# Patient Record
Sex: Male | Born: 1935 | Race: White | Hispanic: No | Smoking: Never smoker
Health system: Southern US, Community
[De-identification: ages and names within clinical notes are randomized; demographics above are authoritative.]

## PROBLEM LIST (undated history)

## (undated) DIAGNOSIS — R609 Edema, unspecified: Secondary | ICD-10-CM

## (undated) DIAGNOSIS — K219 Gastro-esophageal reflux disease without esophagitis: Secondary | ICD-10-CM

## (undated) DIAGNOSIS — D649 Anemia, unspecified: Secondary | ICD-10-CM

## (undated) DIAGNOSIS — F419 Anxiety disorder, unspecified: Secondary | ICD-10-CM

## (undated) DIAGNOSIS — I10 Essential (primary) hypertension: Secondary | ICD-10-CM

## (undated) HISTORY — PX: HERNIA REPAIR: SHX51

## (undated) HISTORY — PX: BLADDER SURGERY: SHX569

---

## 2004-01-22 ENCOUNTER — Other Ambulatory Visit: Payer: Self-pay

## 2007-02-16 ENCOUNTER — Ambulatory Visit: Payer: Self-pay | Admitting: Unknown Physician Specialty

## 2009-10-22 ENCOUNTER — Ambulatory Visit: Payer: Self-pay | Admitting: General Practice

## 2009-11-11 ENCOUNTER — Ambulatory Visit: Payer: Self-pay | Admitting: General Practice

## 2009-11-18 ENCOUNTER — Ambulatory Visit: Payer: Self-pay | Admitting: General Practice

## 2010-04-24 ENCOUNTER — Ambulatory Visit: Payer: Self-pay | Admitting: General Practice

## 2010-05-03 ENCOUNTER — Ambulatory Visit: Payer: Self-pay | Admitting: General Practice

## 2011-01-13 ENCOUNTER — Ambulatory Visit: Payer: Self-pay | Admitting: Unknown Physician Specialty

## 2012-05-23 ENCOUNTER — Ambulatory Visit: Payer: Self-pay | Admitting: Ophthalmology

## 2012-05-23 LAB — POTASSIUM: Potassium: 3.8 mmol/L (ref 3.5–5.1)

## 2012-06-05 ENCOUNTER — Ambulatory Visit: Payer: Self-pay | Admitting: Ophthalmology

## 2012-07-18 ENCOUNTER — Ambulatory Visit: Payer: Self-pay | Admitting: Ophthalmology

## 2012-07-18 LAB — POTASSIUM: Potassium: 3.5 mmol/L (ref 3.5–5.1)

## 2012-07-31 ENCOUNTER — Ambulatory Visit: Payer: Self-pay | Admitting: Ophthalmology

## 2014-07-29 DIAGNOSIS — R339 Retention of urine, unspecified: Secondary | ICD-10-CM | POA: Insufficient documentation

## 2015-02-10 NOTE — Op Note (Signed)
PATIENT NAME:  Danny Brady, Danny Brady MR#:  [id number] DATE OF BIRTH:  [birth year]  DATE OF PROCEDURE:  07/31/2012  PREOPERATIVE DIAGNOSIS: Visually significant cataract of the left eye.   POSTOPERATIVE DIAGNOSIS: Visually significant cataract of the left eye.   OPERATIVE PROCEDURE: Cataract extraction by phacoemulsification with implant of intraocular lens to left eye.   SURGEON: Galen ManilaWilliam Virlee Stroschein, MD.   ANESTHESIA:  1. Managed anesthesia care.  2. Topical tetracaine drops followed by 2% Xylocaine jelly applied in the preoperative holding area.   COMPLICATIONS: None.   TECHNIQUE:  Stop and chop.   DESCRIPTION OF PROCEDURE: The patient was examined and consented in the preoperative holding area where the aforementioned topical anesthesia was applied to the left eye and then brought back to the Operating Room where the left eye was prepped and draped in the usual sterile ophthalmic fashion and a lid speculum was placed. A paracentesis was created with the side port blade and the anterior chamber was filled with viscoelastic. A near clear corneal incision was performed with the steel keratome. A continuous curvilinear capsulorrhexis was performed with a cystotome followed by the capsulorrhexis forceps. Hydrodissection and hydrodelineation were carried out with BSS on a blunt cannula. The lens was removed in a stop and chop technique and the remaining cortical material was removed with the irrigation-aspiration handpiece. The capsular bag was inflated with viscoelastic and the Tecnis ZCB00 20.0-diopter lens, serial number [id number] was placed in the capsular bag without complication. The remaining viscoelastic was removed from the eye with the irrigation-aspiration handpiece. The wounds were hydrated. The anterior chamber was flushed with Miostat and the eye was inflated to physiologic pressure. The wounds were found to be water tight. The eye was dressed with Vigamox. The patient was given protective  glasses to wear throughout the day and a shield with which to sleep tonight. The patient was also given drops with which to begin a drop regimen today and will follow-up with me in one day.  ____________________________ Jerilee FieldWilliam L. Wilena Tyndall, MD wlp:slb D: 07/31/2012 17:05:00 ET T: 08/01/2012 07:28:13 ET JOB#: [id number]  cc: Abhay Godbolt L. Kanijah Groseclose, MD, <Dictator> Jerilee FieldWILLIAM L Adriannah Steinkamp MD ELECTRONICALLY SIGNED 08/13/2012 13:45

## 2015-02-10 NOTE — Op Note (Signed)
PATIENT NAME:  Danny Brady, Danny Brady MR#:  [id number] DATE OF BIRTH:  1927-05-07  DATE OF PROCEDURE:  06/05/2012  PREOPERATIVE DIAGNOSIS: Visually significant cataract of the right eye.   POSTOPERATIVE DIAGNOSIS: Visually significant cataract of the right eye.   OPERATIVE PROCEDURE: Cataract extraction by phacoemulsification with implant of intraocular lens to right eye.   SURGEON: Galen ManilaWilliam Deklen Popelka, MD   ANESTHESIA:  1. Managed anesthesia care.  2. Topical tetracaine drops followed by 2% Xylocaine jelly applied in the preoperative holding area.   COMPLICATIONS: None.   TECHNIQUE:  Stop-and-chop    DESCRIPTION OF PROCEDURE: The patient was examined and consented in the preoperative holding area where the aforementioned topical anesthesia was applied to the right eye and then brought back to the Operating Room where the right eye was prepped and draped in the usual sterile ophthalmic fashion and a lid speculum was placed. A paracentesis was created with the side port blade and the anterior chamber was filled with viscoelastic. A near clear corneal incision was performed with the steel keratome. A continuous curvilinear capsulorrhexis was performed with a cystotome followed by the capsulorrhexis forceps. Hydrodissection and hydrodelineation were carried out with BSS on a blunt cannula. The lens was removed in a stop-and-chop technique and the remaining cortical material was removed with the irrigation-aspiration handpiece. The capsular bag was inflated with viscoelastic and the Technus ZCB00 20.5-diopter lens, serial number [id number] was placed in the capsular bag without complication. The remaining viscoelastic was removed from the eye with the irrigation-aspiration handpiece. The wounds were hydrated. The anterior chamber was flushed with Miostat and the eye was inflated to physiologic pressure. The wounds were found to be water tight. The eye was dressed with Vigamox. The patient was given protective  glasses to wear throughout the day and a shield with which to sleep tonight. The patient was also given drops with which to begin a drop regimen today and will follow-up with me in one day.   ____________________________ Jerilee FieldWilliam L. Dannika Hilgeman, MD wlp:drc D: 06/05/2012 16:59:31 ET T: 06/05/2012 17:41:47 ET JOB#: [id number]  cc: Jeroline Wolbert L. Carmilla Granville, MD, <Dictator> Jerilee FieldWILLIAM L Padraig Nhan MD ELECTRONICALLY SIGNED 06/06/2012 15:32

## 2015-08-18 DIAGNOSIS — Z85828 Personal history of other malignant neoplasm of skin: Secondary | ICD-10-CM | POA: Insufficient documentation

## 2016-02-23 DIAGNOSIS — R339 Retention of urine, unspecified: Secondary | ICD-10-CM | POA: Insufficient documentation

## 2016-03-23 DIAGNOSIS — N138 Other obstructive and reflux uropathy: Secondary | ICD-10-CM | POA: Insufficient documentation

## 2016-03-23 DIAGNOSIS — E782 Mixed hyperlipidemia: Secondary | ICD-10-CM | POA: Insufficient documentation

## 2016-05-02 DIAGNOSIS — I38 Endocarditis, valve unspecified: Secondary | ICD-10-CM | POA: Insufficient documentation

## 2016-09-23 DIAGNOSIS — N393 Stress incontinence (female) (male): Secondary | ICD-10-CM | POA: Insufficient documentation

## 2016-12-21 DIAGNOSIS — E538 Deficiency of other specified B group vitamins: Secondary | ICD-10-CM | POA: Insufficient documentation

## 2018-05-13 ENCOUNTER — Emergency Department: Payer: Medicare Other

## 2018-05-13 ENCOUNTER — Emergency Department
Admission: EM | Admit: 2018-05-13 | Discharge: 2018-05-13 | Disposition: A | Discharge status: Home / Self Care | Payer: Medicare Other | Attending: Emergency Medicine | Admitting: Emergency Medicine

## 2018-05-13 ENCOUNTER — Other Ambulatory Visit: Payer: Self-pay

## 2018-05-13 DIAGNOSIS — W19XXXA Unspecified fall, initial encounter: Secondary | ICD-10-CM

## 2018-05-13 DIAGNOSIS — Y998 Other external cause status: Secondary | ICD-10-CM | POA: Insufficient documentation

## 2018-05-13 DIAGNOSIS — M25552 Pain in left hip: Secondary | ICD-10-CM | POA: Insufficient documentation

## 2018-05-13 DIAGNOSIS — Y92010 Kitchen of single-family (private) house as the place of occurrence of the external cause: Secondary | ICD-10-CM | POA: Diagnosis not present

## 2018-05-13 DIAGNOSIS — Z23 Encounter for immunization: Secondary | ICD-10-CM | POA: Insufficient documentation

## 2018-05-13 DIAGNOSIS — I10 Essential (primary) hypertension: Secondary | ICD-10-CM | POA: Diagnosis not present

## 2018-05-13 DIAGNOSIS — W01198A Fall on same level from slipping, tripping and stumbling with subsequent striking against other object, initial encounter: Secondary | ICD-10-CM | POA: Insufficient documentation

## 2018-05-13 DIAGNOSIS — S299XXA Unspecified injury of thorax, initial encounter: Secondary | ICD-10-CM | POA: Diagnosis present

## 2018-05-13 DIAGNOSIS — Y9301 Activity, walking, marching and hiking: Secondary | ICD-10-CM | POA: Insufficient documentation

## 2018-05-13 DIAGNOSIS — S20219A Contusion of unspecified front wall of thorax, initial encounter: Secondary | ICD-10-CM

## 2018-05-13 HISTORY — DX: Essential (primary) hypertension: I10

## 2018-05-13 MED ORDER — BACITRACIN ZINC 500 UNIT/GM EX OINT
TOPICAL_OINTMENT | Freq: Once | CUTANEOUS | Status: AC
  Administered 2018-05-13: 1 via TOPICAL
  Filled 2018-05-13: qty 0.9

## 2018-05-13 MED ORDER — TETANUS-DIPHTH-ACELL PERTUSSIS 5-2.5-18.5 LF-MCG/0.5 IM SUSP
0.5000 mL | Freq: Once | INTRAMUSCULAR | Status: AC
  Administered 2018-05-13: 0.5 mL via INTRAMUSCULAR
  Filled 2018-05-13: qty 0.5

## 2018-05-13 NOTE — ED Provider Notes (Signed)
[facility] Emergency Department Provider Note  ____________________________________________   I have reviewed the triage vital signs and the nursing notes. Where available I have reviewed prior notes and, if possible and indicated, outside hospital notes.    HISTORY  Chief Complaint Fall    HPI Danny Brady is a 84 y.o. male not on any blood thinners, he was getting something out of the kitchen he turned and stumbled, he is arthritic feet and sometimes he states he trips.  He had a mechanical trip.  He did not pass out he did not hit his head he bumped his back against a cabinet and then landed on his bottom.  He has a skin tear to his back he states and family collaborates, and he has a minimal pain in the left hip but is able to ambulate with no difficulty.  Did not pass out did not hit his head no prodrome no chest pain no shortness of breath no nausea no vomiting no abdominal pain no dysuria no urinary frequency no new medications feels fine at this time unsure about tetanus shot   Past Medical History:  Diagnosis Date  . Hypertension     There are no active problems to display for this patient.   History reviewed. No pertinent surgical history.  Prior to Admission medications   Not on File    Allergies Patient has no known allergies.  History reviewed. No pertinent family history.  Social History Social History   Tobacco Use  . Smoking status: Never Smoker  . Smokeless tobacco: Never Used  Substance Use Topics  . Alcohol use: Not Currently    Frequency: Never  . Drug use: Never    Review of Systems Constitutional: No fever/chills Eyes: No visual changes. ENT: No sore throat. No stiff neck no neck pain Cardiovascular: Denies chest pain. Respiratory: Denies shortness of breath. Gastrointestinal:   no vomiting.  No diarrhea.  No constipation. Genitourinary: Negative for dysuria. Musculoskeletal: Negative lower extremity  swelling Skin: Negative for rash. Neurological: Negative for severe headaches, focal weakness or numbness.   ____________________________________________   PHYSICAL EXAM:  VITAL SIGNS: ED Triage Vitals  Enc Vitals Group     BP 05/13/18 1316 (!) 141/82     Pulse Rate 05/13/18 1316 79     Resp 05/13/18 1316 14     Temp 05/13/18 1316 98.6 F (37 C)     Temp Source 05/13/18 1316 Oral     SpO2 05/13/18 1316 99 %     Weight 05/13/18 1317 194 lb (88 kg)     Height 05/13/18 1317 5\' 9"  (1.753 m)     Head Circumference --      Peak Flow --      Pain Score 05/13/18 1317 10     Pain Loc --      Pain Edu? --      Excl. in GC? --     Constitutional: Alert and oriented. Well appearing and in no acute distress. Eyes: Conjunctivae are normal Head: Atraumatic HEENT: No congestion/rhinnorhea. Mucous membranes are moist.  Oropharynx non-erythematous Neck:   Nontender with no meningismus, no masses, no stridor Cardiovascular: Normal rate, regular rhythm. Grossly normal heart sounds.  Good peripheral circulation. Back: Respiratory: Normal respiratory effort.  No retractions. Lungs CTAB. Abdominal: Soft and nontender. No distention. No guarding no rebound Back: In the mid thoracic region just to the left of the spine there is an abrasion, no active bleeding not deep nothing to suture  mild hematoma underneath no crepitus no fracture palpated.  there is no midline tenderness there are no lesions noted. there is no CVA tenderness Musculoskeletal: No lower extremity tenderness, he states he has some tenderness to his left hip I can fully range it and he was able to bear weight upon it.  No other bony tenderness noted no upper extremity tenderness. No joint effusions, no DVT signs strong distal pulses no edema Neurologic:  Normal speech and language. No gross focal neurologic deficits are appreciated.  Skin:  Skin is warm, dry and intact. No rash noted. Psychiatric: Mood and affect are normal. Speech  and behavior are normal.  ____________________________________________   LABS (all labs ordered are listed, but only abnormal results are displayed)  Labs Reviewed - No data to display  Pertinent labs  results that were available during my care of the patient were reviewed by me and considered in my medical decision making (see chart for details). ____________________________________________  EKG  I personally interpreted any EKGs ordered by me or triage  ____________________________________________  RADIOLOGY  Pertinent labs & imaging results that were available during my care of the patient were reviewed by me and considered in my medical decision making (see chart for details). If possible, patient and/or family made aware of any abnormal findings.  No results found. ____________________________________________    PROCEDURES  Procedure(s) performed: None  Procedures  Critical Care performed: None  ____________________________________________   INITIAL IMPRESSION / ASSESSMENT AND PLAN / ED COURSE  Pertinent labs & imaging results that were available during my care of the patient were reviewed by me and considered in my medical decision making (see chart for details).  Non-syncopal fall, no complaints except for mild injury from the fall itself.  Low suspicion for hip fracture as he was amatory and I can range but will get an x-ray, he does not have significant pain does not want pain medication.  Has an abrasion to his back, we will clean it up and put bacitracin on it but there is no indication for repair.  Low suspicion for spinal injury, he has no midline tenderness he is neurologically intact, in addition, low suspicion for significant rib injury, no crepitus no evidence of flail chest, nonetheless we will get a chest x-ray, fall is reassuring is my hope that we can safely home we will update his tetanus    ____________________________________________   FINAL  CLINICAL IMPRESSION(S) / ED DIAGNOSES  Final diagnoses:  None      This chart was dictated using voice recognition software.  Despite best efforts to proofread,  errors can occur which can change meaning.      Jeanmarie Plant, MD 05/13/18 1350

## 2018-05-13 NOTE — Discharge Instructions (Addendum)
Take Tylenol or Motrin as tolerated directed for the pain, return to the emergency room for new or worrisome symptoms any shortness of breath, chest pain, hip pain, lightheadedness, frequent falls or other concerns please be careful walking around house, if you have any complaints or concerns please come back.

## 2018-05-13 NOTE — ED Triage Notes (Signed)
Pt presents via EMS c/o mechanical fall at home while cooking. Denies syncope. Denies LOC. Reports left hip pain and back pain. Skin tear noted to back. Ambulatory to stretcher per EMS. A&Ox4.

## 2018-07-25 ENCOUNTER — Other Ambulatory Visit: Payer: Self-pay

## 2018-07-25 ENCOUNTER — Emergency Department
Admission: EM | Admit: 2018-07-25 | Discharge: 2018-07-25 | Disposition: A | Discharge status: Home / Self Care | Payer: Medicare Other | Attending: Emergency Medicine | Admitting: Emergency Medicine

## 2018-07-25 ENCOUNTER — Encounter: Payer: Self-pay | Admitting: Emergency Medicine

## 2018-07-25 ENCOUNTER — Emergency Department: Payer: Medicare Other

## 2018-07-25 DIAGNOSIS — Y9389 Activity, other specified: Secondary | ICD-10-CM | POA: Diagnosis not present

## 2018-07-25 DIAGNOSIS — I10 Essential (primary) hypertension: Secondary | ICD-10-CM | POA: Insufficient documentation

## 2018-07-25 DIAGNOSIS — W01198A Fall on same level from slipping, tripping and stumbling with subsequent striking against other object, initial encounter: Secondary | ICD-10-CM | POA: Insufficient documentation

## 2018-07-25 DIAGNOSIS — Y999 Unspecified external cause status: Secondary | ICD-10-CM | POA: Insufficient documentation

## 2018-07-25 DIAGNOSIS — S0003XA Contusion of scalp, initial encounter: Secondary | ICD-10-CM | POA: Diagnosis not present

## 2018-07-25 DIAGNOSIS — S0990XA Unspecified injury of head, initial encounter: Secondary | ICD-10-CM | POA: Diagnosis present

## 2018-07-25 DIAGNOSIS — S0083XA Contusion of other part of head, initial encounter: Secondary | ICD-10-CM

## 2018-07-25 DIAGNOSIS — W19XXXA Unspecified fall, initial encounter: Secondary | ICD-10-CM

## 2018-07-25 DIAGNOSIS — Y92009 Unspecified place in unspecified non-institutional (private) residence as the place of occurrence of the external cause: Secondary | ICD-10-CM | POA: Diagnosis not present

## 2018-07-25 MED ORDER — BACITRACIN ZINC 500 UNIT/GM EX OINT: TOPICAL_OINTMENT | Freq: Once | CUTANEOUS | Status: DC

## 2018-07-25 NOTE — ED Triage Notes (Addendum)
Patient ambulatory to triage with steady gait, without difficulty or distress noted; pt reports PTA bent over to put socks on and fell forward hitting forehead on brick; denies LOC/HA/dizziness; swelling/abrasion noted to right side forehead; also st skin tear to right arm; pt denies right arm pain and st "its only a little sore", indicating head; CT has been performed while waiting; ice pack applied to forehead; no cervical tenderness with palpation

## 2018-07-25 NOTE — Discharge Instructions (Signed)
Your exam and CT is essentially normal following your fall. You have a large hematoma on the forehead. You can expect this to remain for a few days. You may develop a black eye as the swelling resolves. Take Tylenol or ibuprofen as needed for pain. See Dr. Hyacinth Meeker as needed. Return to the Emergency Department for any concerning symptoms.

## 2018-07-26 NOTE — ED Provider Notes (Signed)
[facility] Emergency Department Provider Note ____________________________________________  Time seen: 2017  I have reviewed the triage vital signs and the nursing notes.  HISTORY  Chief Complaint  Fall  HPI Danny Brady is a 84 y.o. male presents to the ED accompanied by his adult children, for evaluation of injury sustained following a fall at home. He describes bending over to pick up and put on his socks, when he lost his balance and tipped over. He his his forehead on the bricks. There is no reported LOC, headache, dizziness, or weakness. He presents now with a large hematoma over the right brow/forehead. There is no reported arm pain, chest pain, or neck pain.   Past Medical History:  Diagnosis Date  . Hypertension    There are no active problems to display for this patient.  History reviewed. No pertinent surgical history.  Prior to Admission medications   Not on File   Allergies Patient has no known allergies.  No family history on file.  Social History Social History   Tobacco Use  . Smoking status: Never Smoker  . Smokeless tobacco: Never Used  Substance Use Topics  . Alcohol use: Not Currently    Frequency: Never  . Drug use: Never    Review of Systems  Constitutional: Negative for fever. Eyes: Negative for visual changes. ENT: Negative for sore throat. Cardiovascular: Negative for chest pain. Respiratory: Negative for shortness of breath. Gastrointestinal: Negative for abdominal pain, vomiting and diarrhea. Genitourinary: Negative for dysuria. Musculoskeletal: Negative for back pain. Skin: Negative for rash. Forehead hematoma & Abrasions over the forehead Neurological: Negative for headaches, focal weakness or numbness. ____________________________________________  PHYSICAL EXAM:  VITAL SIGNS: ED Triage Vitals [07/25/18 1954]  Enc Vitals Group     BP 134/67     Pulse Rate 76     Resp 18     Temp 97.9 F (36.6 C)      Temp Source Oral     SpO2 97 %     Weight 194 lb (88 kg)     Height 5\' 8"  (1.727 m)     Head Circumference      Peak Flow      Pain Score 3     Pain Loc      Pain Edu?      Excl. in GC?     Constitutional: Alert and oriented. Well appearing and in no distress. Head: Normocephalic and atraumatic, except for a large hematoma to the right forehead. There are overlying abrasions to the same area.  Eyes: Conjunctivae are normal. Normal extraocular movements Ears: Canals clear. TMs intact bilaterally. Nose: No congestion/rhinorrhea/epistaxis. Neck: Supple. Normal ROM without midline tenderness.  Cardiovascular: Normal rate, regular rhythm. Normal distal pulses. Respiratory: Normal respiratory effort. No wheezes/rales/rhonchi. Gastrointestinal: Soft and nontender. No distention. Musculoskeletal: Nontender with normal range of motion in all extremities.  Neurologic:  Normal gait without ataxia. Normal speech and language. No gross focal neurologic deficits are appreciated. Skin:  Skin is warm, dry and intact. No rash noted. Psychiatric: Mood and affect are normal. Patient exhibits appropriate insight and judgment. ____________________________________________   RADIOLOGY  CT Head w/o CM  IMPRESSION: Large right anterior frontal scalp hematoma.  No acute intracranial hemorrhage or other acute intracranial abnormality.  Chronic atrophy and white matter microvascular changes. ____________________________________________  PROCEDURES  Procedures ____________________________________________  INITIAL IMPRESSION / ASSESSMENT AND PLAN / ED COURSE  Patient with ED evaluation of injury sustained following a mechanical fall.  Patient fell from  ground-level, and hit his forehead.  He had a large hematoma to the forehead.  CT is reassuring to him family as it shows no acute intracranial process.  Patient otherwise has no significant complaints at this time.  Will be discharged with  instructions to treat the abrasions with antibiotic ointment and apply ice to the hematoma.  He should follow-up with his primary provider or return to the ED immediately for any worsening symptoms as discussed.  He is also informed he will likely develop some swelling and edema to the lid and ecchymosis to the right eye.  Patient will take Tylenol as needed for pain. ____________________________________________  FINAL CLINICAL IMPRESSION(S) / ED DIAGNOSES  Final diagnoses:  Fall in home, initial encounter  Minor head injury, initial encounter  Contusion of face, initial encounter      Lissa Hoard, PA-C 07/26/18 0058    Myrna Blazer, MD 07/28/18 2312

## 2019-11-20 ENCOUNTER — Encounter
Admission: EM | Disposition: A | Discharge status: Skilled Nursing Facility | Payer: Self-pay | Source: Home / Self Care | Attending: Family Medicine

## 2019-11-20 ENCOUNTER — Emergency Department: Payer: Medicare PPO

## 2019-11-20 ENCOUNTER — Encounter: Payer: Self-pay | Admitting: Emergency Medicine

## 2019-11-20 ENCOUNTER — Inpatient Hospital Stay: Payer: Medicare PPO | Admitting: Anesthesiology

## 2019-11-20 ENCOUNTER — Inpatient Hospital Stay: Payer: Medicare PPO

## 2019-11-20 ENCOUNTER — Inpatient Hospital Stay
Admission: EM | Admit: 2019-11-20 | Discharge: 2019-11-25 | DRG: 481 | Disposition: A | Discharge status: Skilled Nursing Facility | Payer: Medicare PPO | Attending: Hospitalist | Admitting: Hospitalist

## 2019-11-20 ENCOUNTER — Other Ambulatory Visit: Payer: Self-pay

## 2019-11-20 DIAGNOSIS — I480 Paroxysmal atrial fibrillation: Secondary | ICD-10-CM | POA: Diagnosis not present

## 2019-11-20 DIAGNOSIS — I110 Hypertensive heart disease with heart failure: Secondary | ICD-10-CM | POA: Diagnosis present

## 2019-11-20 DIAGNOSIS — N400 Enlarged prostate without lower urinary tract symptoms: Secondary | ICD-10-CM | POA: Diagnosis present

## 2019-11-20 DIAGNOSIS — I10 Essential (primary) hypertension: Secondary | ICD-10-CM | POA: Diagnosis present

## 2019-11-20 DIAGNOSIS — Z888 Allergy status to other drugs, medicaments and biological substances status: Secondary | ICD-10-CM

## 2019-11-20 DIAGNOSIS — E876 Hypokalemia: Secondary | ICD-10-CM | POA: Diagnosis not present

## 2019-11-20 DIAGNOSIS — Z85828 Personal history of other malignant neoplasm of skin: Secondary | ICD-10-CM

## 2019-11-20 DIAGNOSIS — W19XXXA Unspecified fall, initial encounter: Secondary | ICD-10-CM | POA: Diagnosis not present

## 2019-11-20 DIAGNOSIS — D62 Acute posthemorrhagic anemia: Secondary | ICD-10-CM | POA: Diagnosis not present

## 2019-11-20 DIAGNOSIS — M62838 Other muscle spasm: Secondary | ICD-10-CM | POA: Diagnosis not present

## 2019-11-20 DIAGNOSIS — S72141A Displaced intertrochanteric fracture of right femur, initial encounter for closed fracture: Principal | ICD-10-CM | POA: Diagnosis present

## 2019-11-20 DIAGNOSIS — Z79899 Other long term (current) drug therapy: Secondary | ICD-10-CM

## 2019-11-20 DIAGNOSIS — Z419 Encounter for procedure for purposes other than remedying health state, unspecified: Secondary | ICD-10-CM

## 2019-11-20 DIAGNOSIS — W010XXA Fall on same level from slipping, tripping and stumbling without subsequent striking against object, initial encounter: Secondary | ICD-10-CM | POA: Diagnosis present

## 2019-11-20 DIAGNOSIS — I5022 Chronic systolic (congestive) heart failure: Secondary | ICD-10-CM | POA: Diagnosis present

## 2019-11-20 DIAGNOSIS — Z20822 Contact with and (suspected) exposure to covid-19: Secondary | ICD-10-CM | POA: Diagnosis present

## 2019-11-20 DIAGNOSIS — Z7982 Long term (current) use of aspirin: Secondary | ICD-10-CM | POA: Diagnosis not present

## 2019-11-20 DIAGNOSIS — S72091A Other fracture of head and neck of right femur, initial encounter for closed fracture: Secondary | ICD-10-CM | POA: Diagnosis not present

## 2019-11-20 DIAGNOSIS — K219 Gastro-esophageal reflux disease without esophagitis: Secondary | ICD-10-CM | POA: Diagnosis present

## 2019-11-20 DIAGNOSIS — I4819 Other persistent atrial fibrillation: Secondary | ICD-10-CM | POA: Diagnosis present

## 2019-11-20 DIAGNOSIS — S72001A Fracture of unspecified part of neck of right femur, initial encounter for closed fracture: Secondary | ICD-10-CM

## 2019-11-20 HISTORY — DX: Edema, unspecified: R60.9

## 2019-11-20 HISTORY — PX: INTRAMEDULLARY (IM) NAIL INTERTROCHANTERIC: SHX5875

## 2019-11-20 LAB — COMPREHENSIVE METABOLIC PANEL
ALT: 11 U/L (ref 0–44)
AST: 19 U/L (ref 15–41)
Albumin: 4 g/dL (ref 3.5–5.0)
Alkaline Phosphatase: 55 U/L (ref 38–126)
Anion gap: 10 (ref 5–15)
BUN: 27 mg/dL — ABNORMAL HIGH (ref 8–23)
CO2: 26 mmol/L (ref 22–32)
Calcium: 8.9 mg/dL (ref 8.9–10.3)
Chloride: 103 mmol/L (ref 98–111)
Creatinine, Ser: 1.07 mg/dL (ref 0.61–1.24)
GFR calc Af Amer: >60 mL/min (ref >60)
GFR calc non Af Amer: >60 mL/min (ref >60)
Glucose, Bld: 124 mg/dL — ABNORMAL HIGH (ref 70–99)
Potassium: 3.3 mmol/L — ABNORMAL LOW (ref 3.5–5.1)
Sodium: 139 mmol/L (ref 135–145)
Total Bilirubin: 0.9 mg/dL (ref 0.3–1.2)
Total Protein: 7.2 g/dL (ref 6.5–8.1)

## 2019-11-20 LAB — CBC WITH DIFFERENTIAL/PLATELET
Abs Immature Granulocytes: 0.03 10*3/uL (ref 0.00–0.07)
Basophils Absolute: 0 10*3/uL (ref 0.0–0.1)
Basophils Relative: 1 %
Eosinophils Absolute: 0 10*3/uL (ref 0.0–0.5)
Eosinophils Relative: 1 %
HCT: 36.5 % — ABNORMAL LOW (ref 39.0–52.0)
Hemoglobin: 11.7 g/dL — ABNORMAL LOW (ref 13.0–17.0)
Immature Granulocytes: 1 %
Lymphocytes Relative: 19 %
Lymphs Abs: 1 10*3/uL (ref 0.7–4.0)
MCH: 31.5 pg (ref 26.0–34.0)
MCHC: 32.1 g/dL (ref 30.0–36.0)
MCV: 98.4 fL (ref 80.0–100.0)
Monocytes Absolute: 0.4 10*3/uL (ref 0.1–1.0)
Monocytes Relative: 9 %
Neutro Abs: 3.6 10*3/uL (ref 1.7–7.7)
Neutrophils Relative %: 69 %
Platelets: 151 10*3/uL (ref 150–400)
RBC: 3.71 MIL/uL — ABNORMAL LOW (ref 4.22–5.81)
RDW: 14.1 % (ref 11.5–15.5)
WBC: 5.1 10*3/uL (ref 4.0–10.5)
nRBC: 0 % (ref 0.0–0.2)

## 2019-11-20 LAB — BRAIN NATRIURETIC PEPTIDE: B Natriuretic Peptide: 286 pg/mL — ABNORMAL HIGH (ref 0.0–100.0)

## 2019-11-20 LAB — PROTIME-INR
INR: 1 (ref 0.8–1.2)
Prothrombin Time: 13 seconds (ref 11.4–15.2)

## 2019-11-20 LAB — RESPIRATORY PANEL BY RT PCR (FLU A&B, COVID)
Influenza A by PCR: NEGATIVE
Influenza B by PCR: NEGATIVE
SARS Coronavirus 2 by RT PCR: NEGATIVE

## 2019-11-20 LAB — APTT: aPTT: 30 seconds (ref 24–36)

## 2019-11-20 SURGERY — FIXATION, FRACTURE, INTERTROCHANTERIC, WITH INTRAMEDULLARY ROD
Anesthesia: Spinal | Laterality: Right

## 2019-11-20 MED ORDER — METOCLOPRAMIDE HCL 5 MG/ML IJ SOLN: 5.0000 mg | Freq: Three times a day (TID) | INTRAMUSCULAR | Status: DC | PRN

## 2019-11-20 MED ORDER — PROPOFOL 500 MG/50ML IV EMUL
INTRAVENOUS | Status: DC | PRN
  Administered 2019-11-20: 50 ug/kg/min via INTRAVENOUS

## 2019-11-20 MED ORDER — METOCLOPRAMIDE HCL 10 MG PO TABS: 5.0000 mg | ORAL_TABLET | Freq: Three times a day (TID) | ORAL | Status: DC | PRN

## 2019-11-20 MED ORDER — EPHEDRINE SULFATE 50 MG/ML IJ SOLN
INTRAMUSCULAR | Status: AC
  Filled 2019-11-20: qty 1

## 2019-11-20 MED ORDER — ONDANSETRON HCL 4 MG/2ML IJ SOLN
4.0000 mg | Freq: Four times a day (QID) | INTRAMUSCULAR | Status: DC | PRN
  Filled 2019-11-20: qty 2

## 2019-11-20 MED ORDER — MORPHINE SULFATE (PF) 2 MG/ML IV SOLN
0.5000 mg | INTRAVENOUS | Status: DC | PRN
  Administered 2019-11-20: 0.5 mg via INTRAVENOUS
  Filled 2019-11-20: qty 1

## 2019-11-20 MED ORDER — CEFAZOLIN SODIUM-DEXTROSE 2-4 GM/100ML-% IV SOLN
2.0000 g | INTRAVENOUS | Status: AC
  Administered 2019-11-20: 2 g via INTRAVENOUS

## 2019-11-20 MED ORDER — ONDANSETRON HCL 4 MG/2ML IJ SOLN: 4.0000 mg | Freq: Once | INTRAMUSCULAR | Status: DC | PRN

## 2019-11-20 MED ORDER — FUROSEMIDE 40 MG PO TABS
40.0000 mg | ORAL_TABLET | Freq: Every day | ORAL | Status: DC
  Administered 2019-11-22 – 2019-11-25 (×4): 40 mg via ORAL
  Filled 2019-11-20 (×5): qty 1

## 2019-11-20 MED ORDER — METHOCARBAMOL 500 MG PO TABS
500.0000 mg | ORAL_TABLET | Freq: Three times a day (TID) | ORAL | Status: DC | PRN
  Administered 2019-11-20: 500 mg via ORAL
  Filled 2019-11-20 (×2): qty 1

## 2019-11-20 MED ORDER — BUPIVACAINE HCL (PF) 0.5 % IJ SOLN
INTRAMUSCULAR | Status: DC | PRN
  Administered 2019-11-20: 3 mL

## 2019-11-20 MED ORDER — PANTOPRAZOLE SODIUM 40 MG PO TBEC
40.0000 mg | DELAYED_RELEASE_TABLET | Freq: Every day | ORAL | Status: DC
  Administered 2019-11-20 – 2019-11-25 (×6): 40 mg via ORAL
  Filled 2019-11-20 (×6): qty 1

## 2019-11-20 MED ORDER — SPIRONOLACTONE 25 MG PO TABS
12.5000 mg | ORAL_TABLET | Freq: Every day | ORAL | Status: DC
  Administered 2019-11-22 – 2019-11-25 (×4): 12.5 mg via ORAL
  Filled 2019-11-20: qty 1
  Filled 2019-11-20: qty 0.5
  Filled 2019-11-20: qty 1
  Filled 2019-11-20 (×4): qty 0.5
  Filled 2019-11-20 (×3): qty 1

## 2019-11-20 MED ORDER — SODIUM CHLORIDE 0.9 % IV SOLN: Freq: Once | INTRAVENOUS | Status: DC

## 2019-11-20 MED ORDER — SODIUM CHLORIDE 0.9 % IV SOLN
INTRAVENOUS | Status: DC | PRN
  Administered 2019-11-20: 50 ug/min via INTRAVENOUS

## 2019-11-20 MED ORDER — ONDANSETRON HCL 4 MG PO TABS: 4.0000 mg | ORAL_TABLET | Freq: Four times a day (QID) | ORAL | Status: DC | PRN

## 2019-11-20 MED ORDER — FINASTERIDE 5 MG PO TABS
5.0000 mg | ORAL_TABLET | Freq: Every day | ORAL | Status: DC
  Administered 2019-11-21 – 2019-11-25 (×5): 5 mg via ORAL
  Filled 2019-11-20 (×5): qty 1

## 2019-11-20 MED ORDER — PHENYLEPHRINE HCL (PRESSORS) 10 MG/ML IV SOLN
INTRAVENOUS | Status: DC | PRN
  Administered 2019-11-20: 150 ug via INTRAVENOUS
  Administered 2019-11-20: 100 ug via INTRAVENOUS

## 2019-11-20 MED ORDER — SENNOSIDES-DOCUSATE SODIUM 8.6-50 MG PO TABS
1.0000 | ORAL_TABLET | Freq: Every evening | ORAL | Status: DC | PRN
  Administered 2019-11-24: 1 via ORAL
  Filled 2019-11-20: qty 1

## 2019-11-20 MED ORDER — PROPOFOL 10 MG/ML IV BOLUS
INTRAVENOUS | Status: AC
  Filled 2019-11-20: qty 20

## 2019-11-20 MED ORDER — ONDANSETRON HCL 4 MG/2ML IJ SOLN
4.0000 mg | Freq: Once | INTRAMUSCULAR | Status: AC
  Administered 2019-11-20: 4 mg via INTRAVENOUS
  Filled 2019-11-20: qty 2

## 2019-11-20 MED ORDER — FENTANYL CITRATE (PF) 100 MCG/2ML IJ SOLN
25.0000 ug | Freq: Once | INTRAMUSCULAR | Status: AC
  Administered 2019-11-20: 25 ug via INTRAVENOUS
  Filled 2019-11-20: qty 2

## 2019-11-20 MED ORDER — LACTATED RINGERS IV SOLN: INTRAVENOUS | Status: DC | PRN

## 2019-11-20 MED ORDER — DOCUSATE SODIUM 100 MG PO CAPS
100.0000 mg | ORAL_CAPSULE | Freq: Two times a day (BID) | ORAL | Status: DC
  Administered 2019-11-20 – 2019-11-25 (×10): 100 mg via ORAL
  Filled 2019-11-20 (×10): qty 1

## 2019-11-20 MED ORDER — CEFAZOLIN SODIUM-DEXTROSE 1-4 GM/50ML-% IV SOLN
1.0000 g | Freq: Four times a day (QID) | INTRAVENOUS | Status: AC
  Administered 2019-11-20 – 2019-11-21 (×2): 1 g via INTRAVENOUS
  Filled 2019-11-20 (×3): qty 50

## 2019-11-20 MED ORDER — OXYCODONE-ACETAMINOPHEN 5-325 MG PO TABS
1.0000 | ORAL_TABLET | ORAL | Status: DC | PRN
  Administered 2019-11-22 – 2019-11-25 (×5): 1 via ORAL
  Filled 2019-11-20 (×5): qty 1

## 2019-11-20 MED ORDER — SODIUM CHLORIDE FLUSH 0.9 % IV SOLN
INTRAVENOUS | Status: AC
  Filled 2019-11-20: qty 10

## 2019-11-20 MED ORDER — TRANEXAMIC ACID 1000 MG/10ML IV SOLN
2000.0000 mg | Freq: Once | INTRAVENOUS | Status: DC
  Filled 2019-11-20: qty 20

## 2019-11-20 MED ORDER — TRANEXAMIC ACID-NACL 1000-0.7 MG/100ML-% IV SOLN
INTRAVENOUS | Status: AC | PRN
  Administered 2019-11-20: 1000 mg via INTRAVENOUS

## 2019-11-20 MED ORDER — POTASSIUM CHLORIDE CRYS ER 20 MEQ PO TBCR
40.0000 meq | EXTENDED_RELEASE_TABLET | Freq: Once | ORAL | Status: AC
  Administered 2019-11-20: 40 meq via ORAL
  Filled 2019-11-20: qty 2

## 2019-11-20 MED ORDER — LACTATED RINGERS IV SOLN: INTRAVENOUS | Status: DC

## 2019-11-20 MED ORDER — ONDANSETRON HCL 4 MG/2ML IJ SOLN: 4.0000 mg | Freq: Three times a day (TID) | INTRAMUSCULAR | Status: DC | PRN

## 2019-11-20 MED ORDER — PROPOFOL 10 MG/ML IV BOLUS
INTRAVENOUS | Status: DC | PRN
  Administered 2019-11-20: 30 mg via INTRAVENOUS
  Administered 2019-11-20: 20 mg via INTRAVENOUS

## 2019-11-20 MED ORDER — SODIUM CHLORIDE 0.9 % IR SOLN
Status: DC | PRN
  Administered 2019-11-20: 15:00:00 1000 mL

## 2019-11-20 MED ORDER — TRANEXAMIC ACID 1000 MG/10ML IV SOLN
INTRAVENOUS | Status: AC
  Filled 2019-11-20: qty 10

## 2019-11-20 MED ORDER — HYDRALAZINE HCL 25 MG PO TABS: 25.0000 mg | ORAL_TABLET | Freq: Three times a day (TID) | ORAL | Status: DC | PRN

## 2019-11-20 MED ORDER — TRANEXAMIC ACID-NACL 1000-0.7 MG/100ML-% IV SOLN
INTRAVENOUS | Status: DC | PRN
  Administered 2019-11-20: 1000 mg via INTRAVENOUS

## 2019-11-20 MED ORDER — ACETAMINOPHEN 325 MG PO TABS
650.0000 mg | ORAL_TABLET | Freq: Four times a day (QID) | ORAL | Status: DC | PRN
  Administered 2019-11-20 – 2019-11-24 (×7): 650 mg via ORAL
  Filled 2019-11-20 (×8): qty 2

## 2019-11-20 MED ORDER — MORPHINE SULFATE (PF) 2 MG/ML IV SOLN
2.0000 mg | INTRAVENOUS | Status: DC | PRN
  Administered 2019-11-20: 2 mg via INTRAVENOUS
  Filled 2019-11-20: qty 1

## 2019-11-20 MED ORDER — FENTANYL CITRATE (PF) 100 MCG/2ML IJ SOLN: 25.0000 ug | INTRAMUSCULAR | Status: DC | PRN

## 2019-11-20 MED ORDER — ASPIRIN EC 81 MG PO TBEC
81.0000 mg | DELAYED_RELEASE_TABLET | Freq: Every day | ORAL | Status: DC
  Administered 2019-11-21 – 2019-11-25 (×5): 81 mg via ORAL
  Filled 2019-11-20 (×5): qty 1

## 2019-11-20 MED ORDER — FENTANYL CITRATE (PF) 100 MCG/2ML IJ SOLN
INTRAMUSCULAR | Status: DC | PRN
  Administered 2019-11-20: 25 ug via INTRAVENOUS
  Administered 2019-11-20: 50 ug via INTRAVENOUS

## 2019-11-20 MED ORDER — CEPHALEXIN 500 MG PO CAPS
500.0000 mg | ORAL_CAPSULE | Freq: Two times a day (BID) | ORAL | Status: DC
  Administered 2019-11-21 – 2019-11-25 (×9): 500 mg via ORAL
  Filled 2019-11-20 (×9): qty 1

## 2019-11-20 SURGICAL SUPPLY — 34 items
BLADE IM NL HLCL 120X10.35X (Anchor) ×1 IMPLANT
BLADE TFNA HELICAL 120 (Anchor) ×2 IMPLANT
BNDG COHESIVE 4X5 TAN STRL (GAUZE/BANDAGES/DRESSINGS) IMPLANT
BRUSH SCRUB EZ  4% CHG (MISCELLANEOUS) ×4
BRUSH SCRUB EZ 4% CHG (MISCELLANEOUS) ×2 IMPLANT
CANISTER SUCT 1200ML W/VALVE (MISCELLANEOUS) ×3 IMPLANT
CHLORAPREP W/TINT 26 (MISCELLANEOUS) ×3 IMPLANT
COVER WAND RF STERILE (DRAPES) ×3 IMPLANT
DRAPE 3/4 80X56 (DRAPES) ×3 IMPLANT
DRAPE U-SHAPE 47X51 STRL (DRAPES) ×3 IMPLANT
DRSG AQUACEL AG ADV 3.5X 4 (GAUZE/BANDAGES/DRESSINGS) IMPLANT
DRSG AQUACEL AG ADV 3.5X10 (GAUZE/BANDAGES/DRESSINGS) ×3 IMPLANT
ELECT REM PT RETURN 9FT ADLT (ELECTROSURGICAL) ×3
ELECTRODE REM PT RTRN 9FT ADLT (ELECTROSURGICAL) ×1 IMPLANT
GAUZE XEROFORM 1X8 LF (GAUZE/BANDAGES/DRESSINGS) ×3 IMPLANT
GLOVE INDICATOR 8.0 STRL GRN (GLOVE) ×3 IMPLANT
GLOVE SURG ORTHO 8.0 STRL STRW (GLOVE) ×3 IMPLANT
GOWN STRL REUS W/ TWL LRG LVL3 (GOWN DISPOSABLE) ×1 IMPLANT
GOWN STRL REUS W/ TWL XL LVL3 (GOWN DISPOSABLE) ×1 IMPLANT
GOWN STRL REUS W/TWL LRG LVL3 (GOWN DISPOSABLE) ×2
GOWN STRL REUS W/TWL XL LVL3 (GOWN DISPOSABLE) ×2
GUIDEWIRE 3.2X400 (WIRE) ×3 IMPLANT
KIT PATIENT CARE HANA TABLE (KITS) ×3 IMPLANT
KIT TURNOVER CYSTO (KITS) ×3 IMPLANT
MAT ABSORB  FLUID 56X50 GRAY (MISCELLANEOUS) ×2
MAT ABSORB FLUID 56X50 GRAY (MISCELLANEOUS) ×1 IMPLANT
NAIL CAN TFNA 9 130D 400 RT (Nail) ×3 IMPLANT
NS IRRIG 1000ML POUR BTL (IV SOLUTION) ×3 IMPLANT
PACK HIP COMPR (MISCELLANEOUS) ×3 IMPLANT
STAPLER SKIN PROX 35W (STAPLE) ×3 IMPLANT
SUT VIC AB 0 CT1 36 (SUTURE) ×3 IMPLANT
SUT VIC AB 2-0 CT1 27 (SUTURE) ×2
SUT VIC AB 2-0 CT1 TAPERPNT 27 (SUTURE) ×1 IMPLANT
TOWEL OR 17X26 4PK STRL BLUE (TOWEL DISPOSABLE) ×3 IMPLANT

## 2019-11-20 NOTE — Consult Note (Signed)
Plan right hip trochanteric femoral nailing later today. Full consult note to follow. Please keep patient NPO. Rapid COVID test ASAP. Thank you

## 2019-11-20 NOTE — ED Provider Notes (Signed)
[facility] Emergency Department Provider Note  ____________________________________________   First MD Initiated Contact with Patient 11/20/19 [phone]     (approximate)  I have reviewed the triage vital signs and the nursing notes.   HISTORY  Chief Complaint Fall and Leg Pain    HPI Danny Brady is a 86 y.o. male  H/o HTN here with fall. Pt reports he was trying to put his pants on earlier today when he fell due to tripping. Fell onto his side, R hip primarily. Does not believe he hit his head or lost consciousness. Has not been able to walk since then due to R hip pain. Pain is 10/10, aching, throbbing, unable to put weight on. No distal weakness or numbness. On ASA but no other blood thinners. Denies any recent fever, chills, or illnesses and was in his basleine state of health prior to fall.  Pt lives alone but his son lives next door. 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         Past Medical History:  Diagnosis Date  . Edema   . Hypertension     Patient Active Problem List   Diagnosis Date Noted  . Closed right hip fracture (HCC) 11/20/2019  . Hypertension   . Chronic systolic CHF (congestive heart failure) (HCC)   . GERD (gastroesophageal reflux disease)   . Fall   . Closed comminuted fracture of right hip (HCC)   . Hypokalemia   . PAF (paroxysmal atrial fibrillation) (HCC)     Past Surgical History:  Procedure Laterality Date  . BLADDER SURGERY    . HERNIA REPAIR      Prior to Admission medications   Medication Sig Start Date End Date Taking? Authorizing Provider  aspirin 81 MG EC tablet Take 81 mg by mouth daily. 11/26/12  Yes [provider]  cephALEXin (KEFLEX) 500 MG capsule Take 500 mg by mouth 2 (two) times daily. 11/19/19  Yes [provider]  finasteride (PROSCAR) 5 MG tablet Take 5 mg by mouth daily. 11/12/19  Yes [provider]  furosemide (LASIX) 20 MG tablet  Take 40 mg by mouth daily. 07/18/19  Yes [provider]  omeprazole (PRILOSEC) 20 MG capsule Take 20 mg by mouth daily. 10/31/19  Yes [provider]  spironolactone (ALDACTONE) 25 MG tablet Take 0.5 mg by mouth daily. 11/19/19  Yes [provider]  acetaminophen (TYLENOL) 500 MG tablet Take 500 mg by mouth every 6 (six) hours.    [provider]    Allergies Prednisone and Tamsulosin  History reviewed. No pertinent family history.  Social History Social History   Tobacco Use  . Smoking status: Never Smoker  . Smokeless tobacco: Never Used  Substance Use Topics  . Alcohol use: Not Currently  . Drug use: Never    Review of Systems  Review of Systems  Constitutional: Negative for chills, fatigue and fever.  HENT: Negative for sore throat.   Respiratory: Negative for shortness of breath.   Cardiovascular: Negative for chest pain.  Gastrointestinal: Negative for abdominal pain.  Genitourinary: Negative for flank pain.  Musculoskeletal: Positive for arthralgias, gait problem and joint swelling. Negative for neck pain.  Skin: Negative for rash and wound.  Allergic/Immunologic: Negative for immunocompromised state.  Neurological: Negative for weakness and numbness.  Hematological: Does not bruise/bleed easily.  All other systems reviewed and are negative.    ____________________________________________  PHYSICAL EXAM:  VITAL SIGNS: ED Triage Vitals  Enc Vitals Group     BP 11/20/19 0721 (!) 158/83     Pulse Rate 11/20/19 0721 80     Resp 11/20/19 0721 13     Temp 11/20/19 0721 97.8 F (36.6 C)     Temp Source 11/20/19 0721 Oral     SpO2 11/20/19 0721 100 %     Weight 11/20/19 0718 192 lb (87.1 kg)     Height 11/20/19 0718 5\' 8"  (1.727 m)     Head Circumference --      Peak Flow --      Pain Score 11/20/19 0717 8     Pain Loc --      Pain Edu? --      Excl. in GC? --      Physical Exam Vitals and nursing note reviewed.    Constitutional:      General: He is not in acute distress.    Appearance: He is well-developed.  HENT:     Head: Normocephalic and atraumatic.     Comments: Multiple diffuse AKs. Superficial abrasion and ecchymoses posterior scalp and right parietal scalp. No deep lacerations. No step offs. Eyes:     Conjunctiva/sclera: Conjunctivae normal.  Neck:     Comments: No midline or paraspinal TTP. Cardiovascular:     Rate and Rhythm: Normal rate and regular rhythm.     Heart sounds: Normal heart sounds.  Pulmonary:     Effort: Pulmonary effort is normal. No respiratory distress.     Breath sounds: No wheezing.  Abdominal:     General: There is no distension.  Musculoskeletal:     Cervical back: Neck supple.     Comments: Ecchymosis and bruising to the left medial arm.  No deformity.  Skin:    General: Skin is warm.     Capillary Refill: Capillary refill takes less than 2 seconds.     Findings: No rash.  Neurological:     Mental Status: He is alert and oriented to person, place, and time.     Motor: No abnormal muscle tone.      LOWER EXTREMITY EXAM: Right  INSPECTION & PALPATION: Shortening and external rotation of the right leg, with exquisite tenderness with logroll in any passive range of motion.  2+ pitting edema bilaterally.  SENSORY: sensation is intact to light touch in:  Superficial peroneal nerve distribution (over dorsum of foot) Deep peroneal nerve distribution (over first dorsal web space) Sural nerve distribution (over lateral aspect 5th metatarsal) Saphenous nerve distribution (over medial instep)  MOTOR:  + Motor EHL (great toe dorsiflexion) + FHL (great toe plantar flexion)  + TA (ankle dorsiflexion)  + GSC (ankle plantar flexion)  VASCULAR: 2+ dorsalis pedis and posterior tibialis pulses Capillary refill < 2 sec, toes warm and well-perfused  COMPARTMENTS: Soft, warm, well-perfused No pain with passive extension No  parethesias    ____________________________________________   LABS (all labs ordered are listed, but only abnormal results are displayed)  Labs Reviewed  CBC WITH DIFFERENTIAL/PLATELET - Abnormal; Notable for the following components:      Result Value   RBC 3.71 (*)    Hemoglobin 11.7 (*)    HCT 36.5 (*)    All other components within normal limits  COMPREHENSIVE METABOLIC PANEL - Abnormal; Notable for the following components:   Potassium 3.3 (*)    Glucose, Bld 124 (*)    BUN 27 (*)    All other components within normal limits  BRAIN NATRIURETIC PEPTIDE - Abnormal; Notable for the following components:   B Natriuretic Peptide 286.0 (*)    All other components within normal limits  RESPIRATORY PANEL BY RT PCR (FLU A&B, COVID)  PROTIME-INR  APTT  TYPE AND SCREEN    ____________________________________________  EKG: Atrial fibrillation, ventricular rate 82.  QRS 103, QTc 473.  No acute ST elevations or depressions. ________________________________________  RADIOLOGY All imaging, including plain films, CT scans, and ultrasounds, independently reviewed by me, and interpretations confirmed via formal radiology reads.  ED MD interpretation:   Chest x-ray: Clear CT head: No acute abnormality Chest x-ray and CT hip: Acute, comminuted intertrochanteric femur fracture  Official radiology report(s): DG Chest 1 View  Result Date: 11/20/2019 CLINICAL DATA:  Larey Seat. Right hip pain. Suspected fracture. EXAM: CHEST  1 VIEW COMPARISON:  Chest x-ray 05/13/2018 FINDINGS: The heart is enlarged but stable. There is tortuosity and mild ectasia of the thoracic aorta. Chronic bronchitic type interstitial lung changes but no acute pulmonary findings. No pleural effusions. The bony thorax appears intact. Stable radiopaque foreign body noted in the right axilla. IMPRESSION: No acute cardiopulmonary findings. Stable cardiac enlargement. Electronically Signed   By: Rudie Meyer M.D.   On:  11/20/2019 08:57   CT Head Wo Contrast  Result Date: 11/20/2019 CLINICAL DATA:  Pt presents to ED via [facility] with c/o fall. Per EMS pt was putting his pants on when he fell onto the floor. Per EMS pt with outward rotation baseline, however rotation worse after fall, and shortening to R leg, swelling is baseli.*comment was truncated*Headache, post traumatic EXAM: CT HEAD WITHOUT CONTRAST TECHNIQUE: Contiguous axial images were obtained from the base of the skull through the vertex without intravenous contrast. COMPARISON:  07/25/2018 FINDINGS: Brain: No acute intracranial hemorrhage. No focal mass lesion. No CT evidence of acute infarction. No midline shift or mass effect. No hydrocephalus. Basilar cisterns are patent. Chronic low-density extra-axial fluid collection over the LEFT frontal lobe not changed from prior. There are periventricular and subcortical white matter hypodensities. Generalized cortical atrophy. Vascular: No hyperdense vessel or unexpected calcification. Skull: Normal. Negative for fracture or focal lesion. Sinuses/Orbits: Paranasal sinuses and mastoid air cells are clear. Orbits are clear. Other: None. IMPRESSION: 1. No acute intracranial trauma. No change from prior. 2. White matter microvascular disease and atrophy. 3. 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 Electronically Signed   By: Genevive Bi M.D.   On: 11/20/2019 09:09   CT Hip Right Wo Contrast  Result Date: 11/20/2019 CLINICAL DATA:  Fall today with right hip pain and swelling. Intertrochanteric right femur fracture. EXAM: CT OF THE RIGHT HIP WITHOUT CONTRAST TECHNIQUE: Multidetector CT imaging of the right hip was performed according to the standard protocol. Multiplanar CT image reconstructions were also generated. COMPARISON:  Radiographs same date. FINDINGS: Bones/Joint/Cartilage Comminuted intertrochanteric right femur fracture is associated with mild superior displacement and varus angulation. The lesser  trochanter is displaced medially by less than 1 cm. There is no significant extension of the fracture into the femoral neck or femoral head. The femoral head is located. No evidence of dislocation of the visualized right hemipelvis. Mild underlying right hip degenerative changes with a small right hip joint effusion. Ligaments Suboptimally assessed by CT. Muscles and Tendons Mild atrophy of the gluteus musculature. No evident tendon tear. Soft tissues No focal periarticular hematoma. There is mild edema within the subcutaneous fat anterolateral to the right hip. Iliofemoral atherosclerosis and mild sigmoid diverticulosis are noted. IMPRESSION: Comminuted intertrochanteric right  femur fracture as described. Underlying mild right hip degenerative changes. Electronically Signed   By: Carey Bullocks M.D.   On: 11/20/2019 08:52   DG Hip Unilat W or Wo Pelvis 2-3 Views Right  Result Date: 11/20/2019 CLINICAL DATA:  Fall today. Right hip pain and swelling. EXAM: DG HIP (WITH OR WITHOUT PELVIS) 2-3V RIGHT COMPARISON:  None. FINDINGS: The bones appear mildly demineralized. There is a comminuted and mildly displaced intertrochanteric right femur fracture. No dislocation or pelvic fracture identified. There are minimal degenerative changes of both hips and sacroiliac joints. Greater degenerative changes are noted within the lower lumbar spine. IMPRESSION: Comminuted and mildly displaced intertrochanteric right femur fracture. Electronically Signed   By: Carey Bullocks M.D.   On: 11/20/2019 08:54    ____________________________________________  PROCEDURES   Procedure(s) performed (including Critical Care):  Procedures  ____________________________________________  INITIAL IMPRESSION / MDM / ASSESSMENT AND PLAN / ED COURSE  As part of my medical decision making, I reviewed the following data within the electronic MEDICAL RECORD NUMBER Nursing notes reviewed and incorporated, Old chart reviewed, Notes from prior  ED visits, and [place] Controlled Substance Database       *Alic N Spath was evaluated in Emergency Department on 11/20/2019 for the symptoms described in the history of present illness. He was evaluated in the context of the global COVID-19 pandemic, which necessitated consideration that the patient might be at risk for infection with the SARS-CoV-2 virus that causes COVID-19. Institutional protocols and algorithms that pertain to the evaluation of patients at risk for COVID-19 are in a state of rapid change based on information released by regulatory bodies including the CDC and federal and state organizations. These policies and algorithms were followed during the patient's care in the ED.  Some ED evaluations and interventions may be delayed as a result of limited staffing during the pandemic.*  Clinical Course as of Nov 19 1052  Wed Nov 20, 2019  0806 86 yo M here with suspected R hip fx after mechanical/nonsyncopal fall. Denies head trauma but has superficial bruising/ecchymoses to scalp. Will check CT, plain films, reasses.   [CI]  0827 DG Hip Unilat W or Wo Pelvis 2-3 Views Right [[place]]  0827 DG Humerus Left [[place]]  0904 CT Hip Right Wo Contrast [[place]]  0911 Imaging confirms R hip IT fx. Dr. Odis Luster will take to OR today if medically cleared. I've called Hospitalist to admit and evaluate.    [CI]    Clinical Course User Index [CI] Shaune Pollack, MD [] Bosie Helper, Student-PA    Medical Decision Making: As above.  Right hip fracture.  Admit to medicine.  Family updated.  Patient noted to be in A. fib, which is noted in his previous cardiology notes.  Denies blood thinner use  ____________________________________________  FINAL CLINICAL IMPRESSION(S) / ED DIAGNOSES  Final diagnoses:  Closed fracture of right hip, initial encounter (HCC)     MEDICATIONS GIVEN DURING THIS VISIT:  Medications  oxyCODONE-acetaminophen (PERCOCET/ROXICET) 5-325 MG per tablet 1 tablet (has no  administration in time range)  morphine 2 MG/ML injection 0.5 mg (0.5 mg Intravenous Given 11/20/19 1032)  methocarbamol (ROBAXIN) tablet 500 mg (has no administration in time range)  ondansetron (ZOFRAN) injection 4 mg (has no administration in time range)  hydrALAZINE (APRESOLINE) tablet 25 mg (has no administration in time range)  acetaminophen (TYLENOL) tablet 650 mg (has no administration in time range)  potassium chloride SA (KLOR-CON) CR tablet 40 mEq (has no administration in time  range)  aspirin EC tablet 81 mg (has no administration in time range)  spironolactone (ALDACTONE) tablet 12.5 mg (has no administration in time range)  pantoprazole (PROTONIX) EC tablet 40 mg (has no administration in time range)  furosemide (LASIX) tablet 40 mg (has no administration in time range)  finasteride (PROSCAR) tablet 5 mg (5 mg Oral Canceled Entry 11/20/19 0948)  senna-docusate (Senokot-S) tablet 1 tablet (has no administration in time range)  fentaNYL (SUBLIMAZE) injection 25 mcg (25 mcg Intravenous Given 11/20/19 0900)  ondansetron (ZOFRAN) injection 4 mg (4 mg Intravenous Given 11/20/19 0901)     ED Discharge Orders    None       Note:  This document was prepared using Dragon voice recognition software and may include unintentional dictation errors.   Duffy Bruce, MD 11/20/19 1054

## 2019-11-20 NOTE — ED Triage Notes (Signed)
Pt presents to ED via [facility] with c/o fall. Per EMS pt was putting his pants on when he fell onto the floor. Per EMS pt with outward rotation baseline, however rotation worse after fall, and shortening to R leg, swelling is baseline for patient. EMS reports pt c/o pain with movement at this time.   186/92 78 96% RA

## 2019-11-20 NOTE — Anesthesia Postprocedure Evaluation (Signed)
Anesthesia Post Note  Patient: Danny Brady  Procedure(s) Performed: INTRAMEDULLARY (IM) NAIL INTERTROCHANTRIC (Right )  Patient location during evaluation: PACU Anesthesia Type: Spinal Level of consciousness: oriented and awake and alert Pain management: pain level controlled Vital Signs Assessment: post-procedure vital signs reviewed and stable Respiratory status: spontaneous breathing, respiratory function stable and patient connected to nasal cannula oxygen Cardiovascular status: blood pressure returned to baseline and stable Postop Assessment: no headache, no backache and no apparent nausea or vomiting Anesthetic complications: no     Last Vitals:  Vitals:   11/20/19 1645 11/20/19 1646  BP:  (!) 93/57  Pulse: 73 73  Resp: 12 16  Temp:    SpO2: 96% 94%    Last Pain:  Vitals:   11/20/19 1645  TempSrc:   PainSc: 0-No pain    LLE Motor Response: Purposeful movement (11/20/19 1645) LLE Sensation: Tingling (11/20/19 1645) RLE Motor Response: Purposeful movement (11/20/19 1645) RLE Sensation: Tingling (11/20/19 1645)      Corinda Gubler

## 2019-11-20 NOTE — ED Notes (Signed)
Admitting MD at bedside at this time.

## 2019-11-20 NOTE — H&P (Signed)
History and Physical    Danny Brady AST:[id number] DOB: [birth year] DOA: 11/20/2019  Referring MD/NP/PA:   PCP: Rusty Aus, MD   Patient coming from:  The patient is coming from home.  At baseline, pt is independent for most of ADL.        Chief Complaint: fall and right hip pain   HPI: Danny Brady is a 86 y.o. male with medical history significant of hypertension, GERD, atrial fibrillation not on anticoagulants, CHF with EF of 45%, who presents with fall, right hip pain.  Pt states that he fell when he was trying to put his pants on at about 5:45 AM. He injured his right hip, causing pain in the right hip. His right leg is externally rotated. The pain is constant, sharp, nonradiating, 10 out of 10 in severity. Has not been able to walk since then due to R hip pain.  He denies loss of consciousness, no head or neck injury.  Patient does not have chest pain, shortness breath, cough, fever or chills.  No nausea vomiting, diarrhea, abdominal pain, symptoms of UTI or unilateral weakness.  Patient states that he had a skin cancer removed in left upper back yesterday, and was given Keflex for prophylaxis for first.   ED Course: pt was found to have WBC 5.1, BNP 286, INR 1.0, negative RVP for Covid test, potassium 3.3, renal function okay, temperature normal, blood pressure 158/83, heart rate 80, RR 13, oxygen saturation 100% on room air.  Chest x-ray negative.  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 X-ray of right hip/pelvis and CT of right hip showed comminuted intertrochanteric right femur fracture. Pt is admitted to [facility] bed as inpatient.  Review of Systems:   General: no fevers, chills, no body weight gain, has fatigue HEENT: no blurry vision, hearing changes or sore throat Respiratory: no dyspnea, coughing, wheezing CV: no chest pain, no palpitations GI: no nausea, vomiting, abdominal  pain, diarrhea, constipation GU: no dysuria, burning on urination, increased urinary frequency, hematuria  Ext: has leg edema Neuro: no unilateral weakness, numbness, or tingling, no vision change or hearing loss. Has fall Skin: no rash, no skin tear. S/p of resection of skin cancer in the left upper back MSK: No muscle spasm, no deformity, no limitation of range of movement in spin Heme: No easy bruising.  Travel history: No recent long distant travel.  Allergy:  Allergies  Allergen Reactions   Prednisone Other (See Comments)    Not able to sleep. Not able to sleep.    Tamsulosin Other (See Comments)    Unknown insomnia     Past Medical History:  Diagnosis Date   Edema    Hypertension     Past Surgical History:  Procedure Laterality Date   BLADDER SURGERY     HERNIA REPAIR      Social History:  reports that he has never smoked. He has never used smokeless tobacco. He reports previous alcohol use. He reports that he does not use drugs.  Family History: History reviewed. No pertinent family history. tried to have reviewed with pt, but he does not know family medical history.  Prior to Admission medications   Medication Sig Start Date End Date Taking? Authorizing Provider  aspirin 81 MG EC tablet Take 81 mg by mouth daily. 11/26/12  Yes [provider]  cephALEXin (KEFLEX) 500 MG capsule Take 500 mg by mouth 2 (  two) times daily. 11/19/19  Yes [provider]  finasteride (PROSCAR) 5 MG tablet Take 5 mg by mouth daily. 11/12/19  Yes [provider]  furosemide (LASIX) 20 MG tablet Take 40 mg by mouth daily. 07/18/19  Yes [provider]  lisinopril-hydrochlorothiazide (ZESTORETIC) 20-12.5 MG tablet Take 0.5 tablets by mouth daily.   Yes [provider]  omeprazole (PRILOSEC) 20 MG capsule Take 20 mg by mouth daily. 10/31/19  Yes [provider]  spironolactone (ALDACTONE) 25 MG tablet Take 0.5 mg by mouth daily. 11/19/19   Yes [provider]  acetaminophen (TYLENOL) 500 MG tablet Take 500 mg by mouth every 6 (six) hours.    [provider]    Physical Exam: Vitals:   11/20/19 1721 11/20/19 1732 11/20/19 1741 11/20/19 1812  BP: (!) 99/49 96/64 (!) 92/47 (!) 101/53  Pulse: 74   74  Resp: 13   16  Temp:  (!) 97 F (36.1 C)  97.8 F (36.6 C)  TempSrc:    Oral  SpO2: 96% 96% 95% (P) 95%  Weight:      Height:       General: Not in acute distress HEENT:       Eyes: PERRL, EOMI, no scleral icterus.       ENT: No discharge from the ears and nose, no pharynx injection, no tonsillar enlargement.        Neck: No JVD, no bruit, no mass felt. Heme: No neck lymph node enlargement. Cardiac: S1/S2, RRR, No murmurs, No gallops or rubs. Respiratory: No rales, wheezing, rhonchi or rubs. GI: Soft, nondistended, nontender, no rebound pain, no organomegaly, BS present. GU: No hematuria Ext:  1+ pitting leg edema bilaterally.  Has chronic bilateral leg venous insufficiency change. 1+DP/PT pulse bilaterally. Musculoskeletal: No joint deformities, No joint redness or warmth, no limitation of ROM in spin. Skin: S/p of resection of skin cancer in the left upper back Neuro: Alert, oriented X3, cranial nerves II-XII grossly intact, moves all extremities normally.  Psych: Patient is not psychotic, no suicidal or hemocidal ideation.  Labs on Admission: I have personally reviewed following labs and imaging studies  CBC: Recent Labs  Lab 11/20/19 0800  WBC 5.1  NEUTROABS 3.6  HGB 11.7*  HCT 36.5*  MCV 98.4  PLT 151   Basic Metabolic Panel: Recent Labs  Lab 11/20/19 0800  NA 139  K 3.3*  CL 103  CO2 26  GLUCOSE 124*  BUN 27*  CREATININE 1.07  CALCIUM 8.9   GFR: Estimated Creatinine Clearance: 49.2 mL/min (by C-G formula based on SCr of 1.07 mg/dL). Liver Function Tests: Recent Labs  Lab 11/20/19 0800  AST 19  ALT 11  ALKPHOS 55  BILITOT 0.9  PROT 7.2  ALBUMIN 4.0   No results  for input(s): LIPASE, AMYLASE in the last 168 hours. No results for input(s): AMMONIA in the last 168 hours. Coagulation Profile: Recent Labs  Lab 11/20/19 0800  INR 1.0   Cardiac Enzymes: No results for input(s): CKTOTAL, CKMB, CKMBINDEX, TROPONINI in the last 168 hours. BNP (last 3 results) No results for input(s): PROBNP in the last 8760 hours. HbA1C: No results for input(s): HGBA1C in the last 72 hours. CBG: No results for input(s): GLUCAP in the last 168 hours. Lipid Profile: No results for input(s): CHOL, HDL, LDLCALC, TRIG, CHOLHDL, LDLDIRECT in the last 72 hours. Thyroid Function Tests: No results for input(s): TSH, T4TOTAL, FREET4, T3FREE, THYROIDAB in the last 72 hours. Anemia Panel: No results  for input(s): VITAMINB12, FOLATE, FERRITIN, TIBC, IRON, RETICCTPCT in the last 72 hours. Urine analysis: No results found for: COLORURINE, APPEARANCEUR, LABSPEC, PHURINE, GLUCOSEU, HGBUR, BILIRUBINUR, KETONESUR, PROTEINUR, UROBILINOGEN, NITRITE, LEUKOCYTESUR Sepsis Labs: @LABRCNTIP (procalcitonin:4,lacticidven:4) ) Recent Results (from the past 240 hour(s))  Respiratory Panel by RT PCR (Flu A&B, Covid) - Nasopharyngeal Swab     Status: None   Collection Time: 11/20/19  7:21 AM   Specimen: Nasopharyngeal Swab  Result Value Ref Range Status   SARS Coronavirus 2 by RT PCR NEGATIVE NEGATIVE Final    Comment: (NOTE) SARS-CoV-2 target nucleic acids are NOT DETECTED. The SARS-CoV-2 RNA is generally detectable in upper respiratoy specimens during the acute phase of infection. The lowest concentration of SARS-CoV-2 viral copies this assay can detect is 131 copies/mL. A negative result does not preclude SARS-Cov-2 infection and should not be used as the sole basis for treatment or other patient management decisions. A negative result may occur with  improper specimen collection/handling, submission of specimen other than nasopharyngeal swab, presence of viral mutation(s) within  the areas targeted by this assay, and inadequate number of viral copies (<131 copies/mL). A negative result must be combined with clinical observations, patient history, and epidemiological information. The expected result is Negative. Fact Sheet for Patients:  11/22/19 Fact Sheet for Healthcare Providers:  https://www.moore.com/ This test is not yet ap proved or cleared by the https://www.young.biz/ FDA and  has been authorized for detection and/or diagnosis of SARS-CoV-2 by FDA under an Emergency Use Authorization (EUA). This EUA will remain  in effect (meaning this test can be used) for the duration of the COVID-19 declaration under Section 564(b)(1) of the Act, 21 U.S.C. section 360bbb-3(b)(1), unless the authorization is terminated or revoked sooner.    Influenza A by PCR NEGATIVE NEGATIVE Final   Influenza B by PCR NEGATIVE NEGATIVE Final    Comment: (NOTE) The Xpert Xpress SARS-CoV-2/FLU/RSV assay is intended as an aid in  the diagnosis of influenza from Nasopharyngeal swab specimens and  should not be used as a sole basis for treatment. Nasal washings and  aspirates are unacceptable for Xpert Xpress SARS-CoV-2/FLU/RSV  testing. Fact Sheet for Patients: [place] Fact Sheet for Healthcare Providers: https://www.moore.com/ This test is not yet approved or cleared by the https://www.young.biz/ FDA and  has been authorized for detection and/or diagnosis of SARS-CoV-2 by  FDA under an Emergency Use Authorization (EUA). This EUA will remain  in effect (meaning this test can be used) for the duration of the  Covid-19 declaration under Section 564(b)(1) of the Act, 21  U.S.C. section 360bbb-3(b)(1), unless the authorization is  terminated or revoked. Performed at [facility], [address], [place], [place]      Radiological Exams on Admission: DG Chest 1 View  Result  Date: 11/20/2019 CLINICAL DATA:  11/22/2019. Right hip pain. Suspected fracture. EXAM: CHEST  1 VIEW COMPARISON:  Chest x-ray 05/13/2018 FINDINGS: The heart is enlarged but stable. There is tortuosity and mild ectasia of the thoracic aorta. Chronic bronchitic type interstitial lung changes but no acute pulmonary findings. No pleural effusions. The bony thorax appears intact. Stable radiopaque foreign body noted in the right axilla. IMPRESSION: No acute cardiopulmonary findings. Stable cardiac enlargement. Electronically Signed   By: 05/15/2018 M.D.   On: 11/20/2019 08:57   CT Head Wo Contrast  Result Date: 11/20/2019 CLINICAL DATA:  Pt presents to ED via [facility] with c/o fall. Per EMS pt was putting his pants on when he fell onto the  floor. Per EMS pt with outward rotation baseline, however rotation worse after fall, and shortening to R leg, swelling is baseli.*comment was truncated*Headache, post traumatic EXAM: CT HEAD WITHOUT CONTRAST TECHNIQUE: Contiguous axial images were obtained from the base of the skull through the vertex without intravenous contrast. COMPARISON:  07/25/2018 FINDINGS: Brain: No acute intracranial hemorrhage. No focal mass lesion. No CT evidence of acute infarction. No midline shift or mass effect. No hydrocephalus. Basilar cisterns are patent. Chronic low-density extra-axial fluid collection over the LEFT frontal lobe not changed from prior. There are periventricular and subcortical white matter hypodensities. Generalized cortical atrophy. Vascular: No hyperdense vessel or unexpected calcification. Skull: Normal. Negative for fracture or focal lesion. Sinuses/Orbits: Paranasal sinuses and mastoid air cells are clear. Orbits are clear. Other: None. IMPRESSION: 1. No acute intracranial trauma. No change from prior. 2. White matter microvascular disease and atrophy. 3. 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 Electronically Signed   By: Genevive Bi M.D.   On: 11/20/2019 09:09     CT Hip Right Wo Contrast  Result Date: 11/20/2019 CLINICAL DATA:  Fall today with right hip pain and swelling. Intertrochanteric right femur fracture. EXAM: CT OF THE RIGHT HIP WITHOUT CONTRAST TECHNIQUE: Multidetector CT imaging of the right hip was performed according to the standard protocol. Multiplanar CT image reconstructions were also generated. COMPARISON:  Radiographs same date. FINDINGS: Bones/Joint/Cartilage Comminuted intertrochanteric right femur fracture is associated with mild superior displacement and varus angulation. The lesser trochanter is displaced medially by less than 1 cm. There is no significant extension of the fracture into the femoral neck or femoral head. The femoral head is located. No evidence of dislocation of the visualized right hemipelvis. Mild underlying right hip degenerative changes with a small right hip joint effusion. Ligaments Suboptimally assessed by CT. Muscles and Tendons Mild atrophy of the gluteus musculature. No evident tendon tear. Soft tissues No focal periarticular hematoma. There is mild edema within the subcutaneous fat anterolateral to the right hip. Iliofemoral atherosclerosis and mild sigmoid diverticulosis are noted. IMPRESSION: Comminuted intertrochanteric right femur fracture as described. Underlying mild right hip degenerative changes. Electronically Signed   By: Carey Bullocks M.D.   On: 11/20/2019 08:52   DG HIP OPERATIVE UNILAT W OR W/O PELVIS RIGHT  Result Date: 11/20/2019 CLINICAL DATA:  Fracture fixation. EXAM: OPERATIVE left HIP (WITH PELVIS IF PERFORMED) 4 VIEWS TECHNIQUE: Fluoroscopic spot image(s) were submitted for interpretation post-operatively. COMPARISON:  Radiographs 11/20/2019 FINDINGS: Fluoroscopic spot images demonstrate fixation of the intertrochanteric fracture with a intramedullary gamma nail and a fixating proximal dynamic hip screw. Good position and alignment without complicating features. IMPRESSION: Internal fixation of  intertrochanteric fracture. Electronically Signed   By: Rudie Meyer M.D.   On: 11/20/2019 15:38   DG Hip Unilat W or Wo Pelvis 2-3 Views Right  Result Date: 11/20/2019 CLINICAL DATA:  Fall today. Right hip pain and swelling. EXAM: DG HIP (WITH OR WITHOUT PELVIS) 2-3V RIGHT COMPARISON:  None. FINDINGS: The bones appear mildly demineralized. There is a comminuted and mildly displaced intertrochanteric right femur fracture. No dislocation or pelvic fracture identified. There are minimal degenerative changes of both hips and sacroiliac joints. Greater degenerative changes are noted within the lower lumbar spine. IMPRESSION: Comminuted and mildly displaced intertrochanteric right femur fracture. Electronically Signed   By: Carey Bullocks M.D.   On: 11/20/2019 08:54     EKG: Independently reviewed.  Atrial fibrillation, QTc 437, low voltage, nonspecific T wave change    Assessment/Plan Principal Problem:  Closed comminuted fracture of right hip (HCC) Active Problems:   Hypertension   Chronic systolic CHF (congestive heart failure) (HCC)   GERD (gastroesophageal reflux disease)   Fall   Hypokalemia   PAF (paroxysmal atrial fibrillation) (HCC)   Closed right hip fracture (HCC)   Closed comminuted fracture of right hip [facility](HCC): Patient has moderate pain now. No neurovascular compromise. Orthopedic surgeon, Dr. Odis Lusterbowers was consulted.  - will admit to Med-surg bed as inpt - Pain control: morphine prn and percocet - When necessary Zofran for nausea - Robaxin for muscle spasm - Appreciated Dr. Delice LeschBowers's consultation - type and cross - INR/PTT - PT/OT when able to (not ordered now) - Sent message to Dr. Gwen PoundsKowalski of card for presurgical evaluation due to history of sCHF with EF 45% and A. fib  HTN:  -Patient is on Lasix and spironolactone which is a full CHF -hydralazine prn  Chronic systolic CHF (congestive heart failure) (HCC): 2D echo on 11/11/2015 showed EF of 45%.  Patient has 1+ leg  edema, BNP slightly elevated to 86, no pulmonary edema on chest x-ray.  Patient does not have CHF exacerbation, but at risk of developing CHF exacerbation. -Continue home Lasix and spironolactone  GERD (gastroesophageal reflux disease): -PPI  Fall: -Pt/ot when able to   Hypokalemia: K= 3.3  on admission. - Repleted - Check Mg level  PAF (paroxysmal atrial fibrillation) (HCC): not on AV nodal blocker.  Not on anticoagulants at home.  Heart rate is 80s. -cardiac monitoring.     DVT ppx: SCD Code Status: Full code Family Communication: None at bed side.   Disposition Plan:  To be determined Consults called:  Dr. Odis LusterBowers of ortho, and Dr. Gwen PoundsKowalski of Card Admission status: Med-surg bed as inpt      Date of Service 11/20/2019    Lorretta HarpXilin Kasyn Stouffer [facility]   If 7PM-7AM, please contact night-coverage [email or web address] Password [email or web address] 11/20/2019, 6:20 PM

## 2019-11-20 NOTE — Op Note (Signed)
DATE OF SURGERY:  11/20/2019  TIME: 4:03 PM  PATIENT NAME:  Danny Brady  AGE: 86 y.o.  PRE-OPERATIVE DIAGNOSIS:  RIGHTINTERTROCH FRACTURE  POST-OPERATIVE DIAGNOSIS:  SAME  PROCEDURE:  RIGHT INTRAMEDULLARY (IM) NAIL INTERTROCHANTRIC  SURGEON:  Lyndle Herrlich  EBL:  200 cc  COMPLICATIONS:  None apparent  OPERATIVE IMPLANTS: Synthes trochanteric femoral nail  400 mm by 9 mm  with interlocking helical blade  120 mm  PREOPERATIVE INDICATIONS:  DARROLD BEZEK is a 86 y.o. year old who fell and suffered a hip fracture. He was brought into the ER and then admitted and optimized and then elected for surgical intervention.    The risks benefits and alternatives were discussed with the patient including but not limited to the risks of nonoperative treatment, versus surgical intervention including infection, bleeding, nerve injury, malunion, nonunion, hardware prominence, hardware failure, need for hardware removal, blood clots, cardiopulmonary complications, morbidity, mortality, among others, and they were willing to proceed.    OPERATIVE PROCEDURE:  The patient was brought to the operating room and placed in the supine position.  Spinal anesthesia was administered. He was placed on the fracture table.  Closed reduction was performed under C-arm guidance. The length of the femur was also measured using fluoroscopy. Time out was then performed after sterile prep and drape. He received preoperative antibiotics.  Incision was made proximal to the greater trochanter. A guidewire was placed in the appropriate position. Confirmation was made on AP and lateral views. The above-named nail was opened. I opened the proximal femur with a reamer. I then placed the nail by hand easily down. I did not need to ream the femur.  Once the nail was completely seated, I placed a guidepin into the femoral head into the center center position through a second incision.  I measured the length, and then reamed the  lateral cortex and up into the head. I then placed the helical blade. Slight compression was applied. Anatomic fixation achieved. Bone quality was poor.  I then secured the proximal interlock.  I then removed the instruments, and took final C-arm pictures AP and lateral the entire length of the leg. Anatomic reconstruction was achieved, and the wounds were irrigated copiously and closed with Vicryl  followed by staples and dry sterile dressing. Sponge and needle count were correct.   The patient was awakened and returned to PACU in stable and satisfactory condition. There no complications and the patient tolerated the procedure well.  He will be weightbearing as tolerated.    Lyndle Herrlich

## 2019-11-20 NOTE — ED Notes (Signed)
This RN called the OR to inquire about pt's medications before surgery. Meds adjusted on MAR.

## 2019-11-20 NOTE — Consult Note (Signed)
ORTHOPAEDIC CONSULTATION  REQUESTING PHYSICIAN: Lorretta Harp, MD  Chief Complaint: right hip pain  HPI: Danny Brady is a 86 y.o. male who complains of right hip pain. Please see H&P and ED notes for details. Denies any numbness, tingling or constitutional symptoms.  Past Medical History:  Diagnosis Date  . Edema   . Hypertension    Past Surgical History:  Procedure Laterality Date  . BLADDER SURGERY    . HERNIA REPAIR     Social History   Socioeconomic History  . Marital status: Widowed    Spouse name: Not on file  . Number of children: Not on file  . Years of education: Not on file  . Highest education level: Not on file  Occupational History  . Not on file  Tobacco Use  . Smoking status: Never Smoker  . Smokeless tobacco: Never Used  Substance and Sexual Activity  . Alcohol use: Not Currently  . Drug use: Never  . Sexual activity: Not on file  Other Topics Concern  . Not on file  Social History Narrative  . Not on file   Social Determinants of Health   Financial Resource Strain:   . Difficulty of Paying Living Expenses: Not on file  Food Insecurity:   . Worried About Programme researcher, broadcasting/film/video in the Last Year: Not on file  . Ran Out of Food in the Last Year: Not on file  Transportation Needs:   . Lack of Transportation (Medical): Not on file  . Lack of Transportation (Non-Medical): Not on file  Physical Activity:   . Days of Exercise per Week: Not on file  . Minutes of Exercise per Session: Not on file  Stress:   . Feeling of Stress : Not on file  Social Connections:   . Frequency of Communication with Friends and Family: Not on file  . Frequency of Social Gatherings with Friends and Family: Not on file  . Attends Religious Services: Not on file  . Active Member of Clubs or Organizations: Not on file  . Attends Banker Meetings: Not on file  . Marital Status: Not on file   History reviewed. No pertinent family history. Allergies  Allergen  Reactions  . Prednisone Other (See Comments)    Not able to sleep. Not able to sleep.   . Tamsulosin Other (See Comments)    Unknown insomnia    Prior to Admission medications   Medication Sig Start Date End Date Taking? Authorizing Provider  aspirin 81 MG EC tablet Take 81 mg by mouth daily. 11/26/12  Yes [provider]  cephALEXin (KEFLEX) 500 MG capsule Take 500 mg by mouth 2 (two) times daily. 11/19/19  Yes [provider]  finasteride (PROSCAR) 5 MG tablet Take 5 mg by mouth daily. 11/12/19  Yes [provider]  furosemide (LASIX) 20 MG tablet Take 40 mg by mouth daily. 07/18/19  Yes [provider]  omeprazole (PRILOSEC) 20 MG capsule Take 20 mg by mouth daily. 10/31/19  Yes [provider]  spironolactone (ALDACTONE) 25 MG tablet Take 0.5 mg by mouth daily. 11/19/19  Yes [provider]  acetaminophen (TYLENOL) 500 MG tablet Take 500 mg by mouth every 6 (six) hours.    [provider]   DG Chest 1 View  Result Date: 11/20/2019 CLINICAL DATA:  Larey Seat. Right hip pain. Suspected fracture. EXAM: CHEST  1 VIEW COMPARISON:  Chest x-ray 05/13/2018 FINDINGS: The heart is enlarged but stable. There is tortuosity and mild  ectasia of the thoracic aorta. Chronic bronchitic type interstitial lung changes but no acute pulmonary findings. No pleural effusions. The bony thorax appears intact. Stable radiopaque foreign body noted in the right axilla. IMPRESSION: No acute cardiopulmonary findings. Stable cardiac enlargement. Electronically Signed   By: Rudie Meyer M.D.   On: 11/20/2019 08:57   CT Head Wo Contrast  Result Date: 11/20/2019 CLINICAL DATA:  Pt presents to ED via [facility] with c/o fall. Per EMS pt was putting his pants on when he fell onto the floor. Per EMS pt with outward rotation baseline, however rotation worse after fall, and shortening to R leg, swelling is baseli.*comment was truncated*Headache, post traumatic EXAM: CT HEAD  WITHOUT CONTRAST TECHNIQUE: Contiguous axial images were obtained from the base of the skull through the vertex without intravenous contrast. COMPARISON:  07/25/2018 FINDINGS: Brain: No acute intracranial hemorrhage. No focal mass lesion. No CT evidence of acute infarction. No midline shift or mass effect. No hydrocephalus. Basilar cisterns are patent. Chronic low-density extra-axial fluid collection over the LEFT frontal lobe not changed from prior. There are periventricular and subcortical white matter hypodensities. Generalized cortical atrophy. Vascular: No hyperdense vessel or unexpected calcification. Skull: Normal. Negative for fracture or focal lesion. Sinuses/Orbits: Paranasal sinuses and mastoid air cells are clear. Orbits are clear. Other: None. IMPRESSION: 1. No acute intracranial trauma. No change from prior. 2. White matter microvascular disease and atrophy. 3. 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 Electronically Signed   By: Genevive Bi M.D.   On: 11/20/2019 09:09   CT Hip Right Wo Contrast  Result Date: 11/20/2019 CLINICAL DATA:  Fall today with right hip pain and swelling. Intertrochanteric right femur fracture. EXAM: CT OF THE RIGHT HIP WITHOUT CONTRAST TECHNIQUE: Multidetector CT imaging of the right hip was performed according to the standard protocol. Multiplanar CT image reconstructions were also generated. COMPARISON:  Radiographs same date. FINDINGS: Bones/Joint/Cartilage Comminuted intertrochanteric right femur fracture is associated with mild superior displacement and varus angulation. The lesser trochanter is displaced medially by less than 1 cm. There is no significant extension of the fracture into the femoral neck or femoral head. The femoral head is located. No evidence of dislocation of the visualized right hemipelvis. Mild underlying right hip degenerative changes with a small right hip joint effusion. Ligaments Suboptimally assessed by CT. Muscles and Tendons  Mild atrophy of the gluteus musculature. No evident tendon tear. Soft tissues No focal periarticular hematoma. There is mild edema within the subcutaneous fat anterolateral to the right hip. Iliofemoral atherosclerosis and mild sigmoid diverticulosis are noted. IMPRESSION: Comminuted intertrochanteric right femur fracture as described. Underlying mild right hip degenerative changes. Electronically Signed   By: Carey Bullocks M.D.   On: 11/20/2019 08:52   DG Hip Unilat W or Wo Pelvis 2-3 Views Right  Result Date: 11/20/2019 CLINICAL DATA:  Fall today. Right hip pain and swelling. EXAM: DG HIP (WITH OR WITHOUT PELVIS) 2-3V RIGHT COMPARISON:  None. FINDINGS: The bones appear mildly demineralized. There is a comminuted and mildly displaced intertrochanteric right femur fracture. No dislocation or pelvic fracture identified. There are minimal degenerative changes of both hips and sacroiliac joints. Greater degenerative changes are noted within the lower lumbar spine. IMPRESSION: Comminuted and mildly displaced intertrochanteric right femur fracture. Electronically Signed   By: Carey Bullocks M.D.   On: 11/20/2019 08:54    Positive ROS: All other systems have been reviewed and were otherwise negative with the exception of those mentioned in the HPI and as above.  Physical Exam: General: Alert, no acute distress Cardiovascular: No pedal edema Respiratory: No cyanosis, no use of accessory musculature GI: No organomegaly, abdomen is soft and non-tender Skin: No lesions in the area of chief complaint Neurologic: Sensation intact distally Psychiatric: Patient is competent for consent with normal mood and affect Lymphatic: No axillary or cervical lymphadenopathy  MUSCULOSKELETAL: right leg short, externally rotated. Charcot foot. Compartments soft. Good cap refill. Motor and sensory intact distally.  Assessment: Right hip intertrochanteric hip fracture, closed, displaced  Plan: Right hip trochanteric  femoral nail.   The diagnosis, risks, benefits and alternatives to treatment are all discussed in detail with the patient and family. Risks include but are not limited to bleeding, infection, deep vein thrombosis, pulmonary embolism, nerve or vascular injury, non-union, repeat operation, persistent pain, weakness, stiffness and death. He understands and is eager to proceed.     Lovell Sheehan, MD    11/20/2019 2:03 PM

## 2019-11-20 NOTE — Transfer of Care (Signed)
Immediate Anesthesia Transfer of Care Note  Patient: Danny Brady  Procedure(s) Performed: INTRAMEDULLARY (IM) NAIL INTERTROCHANTRIC (Right )  Patient Location: PACU  Anesthesia Type:General  Level of Consciousness: drowsy  Airway & Oxygen Therapy: Patient Spontanous Breathing and Patient connected to face mask oxygen  Post-op Assessment: Report given to RN and Post -op Vital signs reviewed and stable  Post vital signs: Reviewed and stable  Last Vitals:  Vitals Value Taken Time  BP 91/60 11/20/19 1602  Temp    Pulse 63 11/20/19 1600  Resp 12 11/20/19 1602  SpO2 100 % 11/20/19 1600  Vitals shown include unvalidated device data.  Last Pain:  Vitals:   11/20/19 1355  TempSrc: Tympanic  PainSc: 3          Complications: No apparent anesthesia complications

## 2019-11-20 NOTE — Anesthesia Procedure Notes (Signed)
Spinal  Patient location during procedure: OR Start time: 11/20/2019 2:20 PM End time: 11/20/2019 2:32 PM Staffing Performed: anesthesiologist and resident/CRNA  Anesthesiologist: Gunnar Fusi, MD Resident/CRNA: Caryl Asp, CRNA Preanesthetic Checklist Completed: patient identified, IV checked, site marked, risks and benefits discussed, surgical consent, monitors and equipment checked, pre-op evaluation and timeout performed Spinal Block Patient position: sitting Prep: Betadine Patient monitoring: heart rate, continuous pulse ox, blood pressure and cardiac monitor Approach: midline Location: L4-5 Injection technique: single-shot Needle Needle type: Whitacre and Introducer  Needle gauge: 24 G Needle length: 9 cm Additional Notes Negative paresthesia. Negative blood return. Positive free-flowing CSF. Expiration date of kit checked and confirmed. Patient tolerated procedure well, without complications.

## 2019-11-20 NOTE — Anesthesia Preprocedure Evaluation (Addendum)
Anesthesia Evaluation  Patient identified by MRN, date of birth, ID band Patient awake    Reviewed: Allergy & Precautions, NPO status , Patient's Chart, lab work & pertinent test results  History of Anesthesia Complications Negative for: history of anesthetic complications  Airway Mallampati: II       Dental   Pulmonary neg sleep apnea, neg COPD,           Cardiovascular hypertension, Pt. on medications (-) Past MI and (-) CHF (-) dysrhythmias (-) Valvular Problems/Murmurs     Neuro/Psych    GI/Hepatic Neg liver ROS, GERD  Medicated and Controlled,  Endo/Other  neg diabetes  Renal/GU negative Renal ROS     Musculoskeletal   Abdominal   Peds  Hematology   Anesthesia Other Findings   Reproductive/Obstetrics                            Anesthesia Physical Anesthesia Plan  ASA: II  Anesthesia Plan: Spinal   Post-op Pain Management:    Induction:   PONV Risk Score and Plan:   Airway Management Planned:   Additional Equipment:   Intra-op Plan:   Post-operative Plan:   Informed Consent: I have reviewed the patients History and Physical, chart, labs and discussed the procedure including the risks, benefits and alternatives for the proposed anesthesia with the patient or authorized representative who has indicated his/her understanding and acceptance.       Plan Discussed with:   Anesthesia Plan Comments:         Anesthesia Quick Evaluation

## 2019-11-21 LAB — IRON AND TIBC
Iron: 27 ug/dL — ABNORMAL LOW (ref 45–182)
Saturation Ratios: 13 % — ABNORMAL LOW (ref 17.9–39.5)
TIBC: 210 ug/dL — ABNORMAL LOW (ref 250–450)
UIBC: 183 ug/dL

## 2019-11-21 LAB — BASIC METABOLIC PANEL
Anion gap: 9 (ref 5–15)
BUN: 26 mg/dL — ABNORMAL HIGH (ref 8–23)
CO2: 24 mmol/L (ref 22–32)
Calcium: 8 mg/dL — ABNORMAL LOW (ref 8.9–10.3)
Chloride: 103 mmol/L (ref 98–111)
Creatinine, Ser: 1.11 mg/dL (ref 0.61–1.24)
GFR calc Af Amer: >60 mL/min (ref >60)
GFR calc non Af Amer: 58 mL/min — ABNORMAL LOW (ref >60)
Glucose, Bld: 151 mg/dL — ABNORMAL HIGH (ref 70–99)
Potassium: 4.1 mmol/L (ref 3.5–5.1)
Sodium: 136 mmol/L (ref 135–145)

## 2019-11-21 LAB — ABO/RH: ABO/RH(D): A POS

## 2019-11-21 LAB — CBC
HCT: 21.8 % — ABNORMAL LOW (ref 39.0–52.0)
Hemoglobin: 7 g/dL — ABNORMAL LOW (ref 13.0–17.0)
MCH: 31.8 pg (ref 26.0–34.0)
MCHC: 32.1 g/dL (ref 30.0–36.0)
MCV: 99.1 fL (ref 80.0–100.0)
Platelets: 132 10*3/uL — ABNORMAL LOW (ref 150–400)
RBC: 2.2 MIL/uL — ABNORMAL LOW (ref 4.22–5.81)
RDW: 14.1 % (ref 11.5–15.5)
WBC: 8.7 10*3/uL (ref 4.0–10.5)
nRBC: 0 % (ref 0.0–0.2)

## 2019-11-21 LAB — RETICULOCYTES
Immature Retic Fract: 22.9 % — ABNORMAL HIGH (ref 2.3–15.9)
RBC.: 2.17 MIL/uL — ABNORMAL LOW (ref 4.22–5.81)
Retic Count, Absolute: 56 10*3/uL (ref 19.0–186.0)
Retic Ct Pct: 2.6 % (ref 0.4–3.1)

## 2019-11-21 LAB — FOLATE: Folate: 6.9 ng/mL (ref >5.9)

## 2019-11-21 LAB — PREPARE RBC (CROSSMATCH)

## 2019-11-21 LAB — MAGNESIUM: Magnesium: 1.6 mg/dL — ABNORMAL LOW (ref 1.7–2.4)

## 2019-11-21 LAB — VITAMIN B12: Vitamin B-12: 173 pg/mL — ABNORMAL LOW (ref 180–914)

## 2019-11-21 LAB — FERRITIN: Ferritin: 180 ng/mL (ref 24–336)

## 2019-11-21 MED ORDER — SODIUM CHLORIDE 0.9% IV SOLUTION: Freq: Once | INTRAVENOUS | Status: DC

## 2019-11-21 MED ORDER — MAGNESIUM SULFATE 2 GM/50ML IV SOLN
2.0000 g | Freq: Once | INTRAVENOUS | Status: AC
  Administered 2019-11-21: 2 g via INTRAVENOUS
  Filled 2019-11-21: qty 50

## 2019-11-21 MED ORDER — ADULT MULTIVITAMIN W/MINERALS CH
1.0000 | ORAL_TABLET | Freq: Every day | ORAL | Status: DC
  Administered 2019-11-22 – 2019-11-25 (×4): 1 via ORAL
  Filled 2019-11-21 (×4): qty 1

## 2019-11-21 MED ORDER — APIXABAN 5 MG PO TABS: 5.0000 mg | ORAL_TABLET | Freq: Two times a day (BID) | ORAL | Status: DC

## 2019-11-21 MED ORDER — ENSURE ENLIVE PO LIQD
237.0000 mL | Freq: Two times a day (BID) | ORAL | Status: DC
  Administered 2019-11-21 – 2019-11-25 (×8): 237 mL via ORAL

## 2019-11-21 MED ORDER — JUVEN PO PACK
1.0000 | PACK | Freq: Two times a day (BID) | ORAL | Status: DC
  Administered 2019-11-21 – 2019-11-25 (×7): 1 via ORAL

## 2019-11-21 MED ORDER — LACTATED RINGERS IV BOLUS
500.0000 mL | Freq: Once | INTRAVENOUS | Status: AC
  Administered 2019-11-21: 500 mL via INTRAVENOUS

## 2019-11-21 NOTE — TOC Progression Note (Signed)
Transition of Care [facility]) - Progression Note    Patient Details  Name: Danny Brady MRN: [id number] Date of Birth: 1927/03/19  Transition of [facility]) CM/SW Contact  Barrie Dunker, RN Phone Number: 11/21/2019, 10:13 AM  Clinical Narrative:    [name] and bedsearch completed, will review bed offers once obtained, Patient lives alone with people checking on him, has a cane and RW at home to use.         Expected Discharge Plan and Services                                                 Social Determinants of Health (SDOH) Interventions    Readmission Risk Interventions No flowsheet data found.

## 2019-11-21 NOTE — Progress Notes (Signed)
Order received from Dr Amin to discontinue telemetry °

## 2019-11-21 NOTE — Progress Notes (Signed)
PROGRESS NOTE    Danny Brady  WRU:[id number] DOB: [birth year] DOA: 11/20/2019 PCP: Rusty Aus, MD   Brief Narrative:  Danny Brady is a 86 y.o. male with medical history significant of hypertension, GERD, atrial fibrillation not on anticoagulants, CHF with EF of 45%, who presents with mechanical fall resulted in comminuted intertrochanteric right femoral fracture taken to OR by orthopedic for internal fixation with intramedullary nail placement.  Tolerated the procedure well.  Subjective: Patient was complaining of pain in his right hip.  He was given Tylenol which did not help.  Assessment & Plan:   Principal Problem:   Closed comminuted fracture of right hip (HCC) Active Problems:   Hypertension   Chronic systolic CHF (congestive heart failure) (HCC)   GERD (gastroesophageal reflux disease)   Fall   Hypokalemia   PAF (paroxysmal atrial fibrillation) ([place])   Closed right hip fracture (HCC)  Close comminuted fracture of right hip s/p ORIF.  Patient continued to experience some pain this morning.  Tylenol was not helping and he did not received his opioids due to softer blood pressure. -Advised nurse to give him a bolus of 500 along with his pain meds. -Holding home dose of Lasix and spironolactone. -PT/OT recommending SNF placement-social worker trying to get a place for him as he lives alone.   Anemia.  Hemoglobin dropped to 7 from 11.7 in 1 day.  200 cc of blood loss was mentioned in operative note.  Patient was not on any anticoagulation prior to this admission.  MCV 99.1 with a HCT of 21.8 -Check anemia panel. -Transfuse with 1 unit of PRBC. -Continue to monitor.  Hypomagnesemia.  Patient came with hypokalemia which was repleted yesterday.  Magnesium was low today. -Replete magnesium. -Monitor.  Hypertension.  Currently softer blood pressure, patient feels weak and dizzy. Holding home dose of Lasix and spironolactone for today. -Continue to monitor. -Giving him  a bolus of 500 cc.  Chronic systolic heart failure.2D echo on 11/11/2015 showed EF of 45%. Clinically does not appear volume overload.  Cardiology is following. -He will continue home dose of Lasix and spironolactone as blood pressure improves.  Paroxysmal atrial fibrillation.  EKG consistent with rate controlled atrial fibrillation.  Cardiology is on board for pre operative clearance. Not on any AV nodal blockers.  He was not on any anticoagulation at home. -Cardiology recommended to start him on Eliquis postoperatively which was ordered today.  Recent skin biopsy/excision of skin cancer.  No acute issue, being managed as an outpatient. -Wound care for any bandage change requirement. -Continue Keflex which was started by his dermatologist after procedure.  GERD (gastroesophageal reflux disease): -PPI  Objective: Vitals:   11/20/19 2244 11/21/19 0341 11/21/19 0809 11/21/19 1131  BP: (!) 112/58 (!) 90/54 101/61 (!) 106/50  Pulse: (!) 105 73 85 74  Resp: 18 19 16    Temp: 98.9 F (37.2 C) 98.9 F (37.2 C) 98.6 F (37 C)   TempSrc: Oral Oral    SpO2: 97% 98% 98%   Weight:      Height:        Intake/Output Summary (Last 24 hours) at 11/21/2019 1322 Last data filed at 11/21/2019 0026 Gross per 24 hour  Intake 1452.12 ml  Output 675 ml  Net 777.12 ml   Filed Weights   11/20/19 0718 11/20/19 1355  Weight: 87.1 kg 87 kg    Examination:  General exam: Pleasant elderly man, appears calm and comfortable  Respiratory system: Clear to auscultation. Respiratory effort  normal. Cardiovascular system: S1 & S2 heard, RRR. No JVD, murmurs, rubs, gallops or clicks. Gastrointestinal system: Soft, nontender, nondistended, bowel sounds positive. Central nervous system: Alert and oriented. No focal neurological deficits.Symmetric 5 x 5 power. Extremities: No edema, no cyanosis, pulses intact and symmetrical. Skin: No rashes, lesions or ulcers Psychiatry: Judgement and insight appear normal.  Mood & affect appropriate.    DVT prophylaxis: Eliquis Code Status: Full Family Communication: No family at bedside Disposition Plan: Pending improvement and bed availability at SNF.  Patient lives alone and independent in ADLs at baseline.  Consultants:   Orthopedic  Cardiology  Procedures:  Antimicrobials:  Keflex  Data Reviewed: I have personally reviewed following labs and imaging studies  CBC: Recent Labs  Lab 11/20/19 0800 11/21/19 0803  WBC 5.1 8.7  NEUTROABS 3.6  --   HGB 11.7* 7.0*  HCT 36.5* 21.8*  MCV 98.4 99.1  PLT 151 132*   Basic Metabolic Panel: Recent Labs  Lab 11/20/19 0800 11/21/19 0803  NA 139 136  K 3.3* 4.1  CL 103 103  CO2 26 24  GLUCOSE 124* 151*  BUN 27* 26*  CREATININE 1.07 1.11  CALCIUM 8.9 8.0*  MG  --  1.6*   GFR: Estimated Creatinine Clearance: 47.4 mL/min (by C-G formula based on SCr of 1.11 mg/dL). Liver Function Tests: Recent Labs  Lab 11/20/19 0800  AST 19  ALT 11  ALKPHOS 55  BILITOT 0.9  PROT 7.2  ALBUMIN 4.0   No results for input(s): LIPASE, AMYLASE in the last 168 hours. No results for input(s): AMMONIA in the last 168 hours. Coagulation Profile: Recent Labs  Lab 11/20/19 0800  INR 1.0   Cardiac Enzymes: No results for input(s): CKTOTAL, CKMB, CKMBINDEX, TROPONINI in the last 168 hours. BNP (last 3 results) No results for input(s): PROBNP in the last 8760 hours. HbA1C: No results for input(s): HGBA1C in the last 72 hours. CBG: No results for input(s): GLUCAP in the last 168 hours. Lipid Profile: No results for input(s): CHOL, HDL, LDLCALC, TRIG, CHOLHDL, LDLDIRECT in the last 72 hours. Thyroid Function Tests: No results for input(s): TSH, T4TOTAL, FREET4, T3FREE, THYROIDAB in the last 72 hours. Anemia Panel: No results for input(s): VITAMINB12, FOLATE, FERRITIN, TIBC, IRON, RETICCTPCT in the last 72 hours. Sepsis Labs: No results for input(s): PROCALCITON, LATICACIDVEN in the last 168  hours.  Recent Results (from the past 240 hour(s))  Respiratory Panel by RT PCR (Flu A&B, Covid) - Nasopharyngeal Swab     Status: None   Collection Time: 11/20/19  7:21 AM   Specimen: Nasopharyngeal Swab  Result Value Ref Range Status   SARS Coronavirus 2 by RT PCR NEGATIVE NEGATIVE Final    Comment: (NOTE) SARS-CoV-2 target nucleic acids are NOT DETECTED. The SARS-CoV-2 RNA is generally detectable in upper respiratoy specimens during the acute phase of infection. The lowest concentration of SARS-CoV-2 viral copies this assay can detect is 131 copies/mL. A negative result does not preclude SARS-Cov-2 infection and should not be used as the sole basis for treatment or other patient management decisions. A negative result may occur with  improper specimen collection/handling, submission of specimen other than nasopharyngeal swab, presence of viral mutation(s) within the areas targeted by this assay, and inadequate number of viral copies (<131 copies/mL). A negative result must be combined with clinical observations, patient history, and epidemiological information. The expected result is Negative. Fact Sheet for Patients:  https://www.moore.com/ Fact Sheet for Healthcare Providers:  https://www.young.biz/ This test is not yet  ap proved or cleared by the [place] and  has been authorized for detection and/or diagnosis of SARS-CoV-2 by FDA under an Emergency Use Authorization (EUA). This EUA will remain  in effect (meaning this test can be used) for the duration of the COVID-19 declaration under Section 564(b)(1) of the Act, 21 U.S.C. section 360bbb-3(b)(1), unless the authorization is terminated or revoked sooner.    Influenza A by PCR NEGATIVE NEGATIVE Final   Influenza B by PCR NEGATIVE NEGATIVE Final    Comment: (NOTE) The Xpert Xpress SARS-CoV-2/FLU/RSV assay is intended as an aid in  the diagnosis of influenza from Nasopharyngeal  swab specimens and  should not be used as a sole basis for treatment. Nasal washings and  aspirates are unacceptable for Xpert Xpress SARS-CoV-2/FLU/RSV  testing. Fact Sheet for Patients: https://www.moore.com/ Fact Sheet for Healthcare Providers: https://www.young.biz/ This test is not yet approved or cleared by the [place] FDA and  has been authorized for detection and/or diagnosis of SARS-CoV-2 by  FDA under an Emergency Use Authorization (EUA). This EUA will remain  in effect (meaning this test can be used) for the duration of the  Covid-19 declaration under Section 564(b)(1) of the Act, 21  U.S.C. section 360bbb-3(b)(1), unless the authorization is  terminated or revoked. Performed at [facility], [address] [place], [place] [address]      Radiology Studies: DG Chest 1 View  Result Date: 11/20/2019 CLINICAL DATA:  Larey Seat. Right hip pain. Suspected fracture. EXAM: CHEST  1 VIEW COMPARISON:  Chest x-ray 05/13/2018 FINDINGS: The heart is enlarged but stable. There is tortuosity and mild ectasia of the thoracic aorta. Chronic bronchitic type interstitial lung changes but no acute pulmonary findings. No pleural effusions. The bony thorax appears intact. Stable radiopaque foreign body noted in the right axilla. IMPRESSION: No acute cardiopulmonary findings. Stable cardiac enlargement. Electronically Signed   By: Rudie Meyer M.D.   On: 11/20/2019 08:57   CT Head Wo Contrast  Result Date: 11/20/2019 CLINICAL DATA:  Pt presents to ED via [facility] with c/o fall. Per EMS pt was putting his pants on when he fell onto the floor. Per EMS pt with outward rotation baseline, however rotation worse after fall, and shortening to R leg, swelling is baseli.*comment was truncated*Headache, post traumatic EXAM: CT HEAD WITHOUT CONTRAST TECHNIQUE: Contiguous axial images were obtained from the base of the skull through the vertex without intravenous  contrast. COMPARISON:  07/25/2018 FINDINGS: Brain: No acute intracranial hemorrhage. No focal mass lesion. No CT evidence of acute infarction. No midline shift or mass effect. No hydrocephalus. Basilar cisterns are patent. Chronic low-density extra-axial fluid collection over the LEFT frontal lobe not changed from prior. There are periventricular and subcortical white matter hypodensities. Generalized cortical atrophy. Vascular: No hyperdense vessel or unexpected calcification. Skull: Normal. Negative for fracture or focal lesion. Sinuses/Orbits: Paranasal sinuses and mastoid air cells are clear. Orbits are clear. Other: None. IMPRESSION: 1. No acute intracranial trauma. No change from prior. 2. White matter microvascular disease and atrophy. 3. 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 Electronically Signed   By: Genevive Bi M.D.   On: 11/20/2019 09:09   CT Hip Right Wo Contrast  Result Date: 11/20/2019 CLINICAL DATA:  Fall today with right hip pain and swelling. Intertrochanteric right femur fracture. EXAM: CT OF THE RIGHT HIP WITHOUT CONTRAST TECHNIQUE: Multidetector CT imaging of the right hip was performed according to the standard protocol. Multiplanar CT image reconstructions were also generated. COMPARISON:  Radiographs same date. FINDINGS: Bones/Joint/Cartilage Comminuted intertrochanteric right femur fracture is associated with mild superior displacement and varus angulation. The lesser trochanter is displaced medially by less than 1 cm. There is no significant extension of the fracture into the femoral neck or femoral head. The femoral head is located. No evidence of dislocation of the visualized right hemipelvis. Mild underlying right hip degenerative changes with a small right hip joint effusion. Ligaments Suboptimally assessed by CT. Muscles and Tendons Mild atrophy of the gluteus musculature. No evident tendon tear. Soft tissues No focal periarticular hematoma. There is mild edema  within the subcutaneous fat anterolateral to the right hip. Iliofemoral atherosclerosis and mild sigmoid diverticulosis are noted. IMPRESSION: Comminuted intertrochanteric right femur fracture as described. Underlying mild right hip degenerative changes. Electronically Signed   By: Carey Bullocks M.D.   On: 11/20/2019 08:52   DG HIP OPERATIVE UNILAT W OR W/O PELVIS RIGHT  Result Date: 11/20/2019 CLINICAL DATA:  Fracture fixation. EXAM: OPERATIVE left HIP (WITH PELVIS IF PERFORMED) 4 VIEWS TECHNIQUE: Fluoroscopic spot image(s) were submitted for interpretation post-operatively. COMPARISON:  Radiographs 11/20/2019 FINDINGS: Fluoroscopic spot images demonstrate fixation of the intertrochanteric fracture with a intramedullary gamma nail and a fixating proximal dynamic hip screw. Good position and alignment without complicating features. IMPRESSION: Internal fixation of intertrochanteric fracture. Electronically Signed   By: Rudie Meyer M.D.   On: 11/20/2019 15:38   DG Hip Unilat W or Wo Pelvis 2-3 Views Right  Result Date: 11/20/2019 CLINICAL DATA:  Fall today. Right hip pain and swelling. EXAM: DG HIP (WITH OR WITHOUT PELVIS) 2-3V RIGHT COMPARISON:  None. FINDINGS: The bones appear mildly demineralized. There is a comminuted and mildly displaced intertrochanteric right femur fracture. No dislocation or pelvic fracture identified. There are minimal degenerative changes of both hips and sacroiliac joints. Greater degenerative changes are noted within the lower lumbar spine. IMPRESSION: Comminuted and mildly displaced intertrochanteric right femur fracture. Electronically Signed   By: Carey Bullocks M.D.   On: 11/20/2019 08:54    Scheduled Meds: . aspirin EC  81 mg Oral Daily  . cephALEXin  500 mg Oral BID  . docusate sodium  100 mg Oral BID  . finasteride  5 mg Oral Daily  . furosemide  40 mg Oral Daily  . pantoprazole  40 mg Oral Daily  . spironolactone  12.5 mg Oral Daily  . tranexamic acid  (CYKLOKAPRON) topical -INTRAOP  2,000 mg Topical Once   Continuous Infusions: . lactated ringers 75 mL/hr at 11/20/19 2136     LOS: 1 day   Time spent: 40 minutes.  Arnetha Courser, MD [facility] Pager [phone]  If 7PM-7AM, please contact night-coverage [email or web address] Password [facility] 11/21/2019, 1:22 PM   This record has been created using Conservation officer, historic buildings. Errors have been sought and corrected,but may not always be located. Such creation errors do not reflect on the standard of care.

## 2019-11-21 NOTE — Progress Notes (Signed)
Physical Therapy Treatment Patient Details Name: Danny Brady MRN: [id number] DOB: 20-Apr-1927 Today's Date: 11/21/2019    History of Present Illness Pt is 86 yo male s/p IM nailing for R hip fx. 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    PT Comments    Patient easily woken at start of session, reported significant R hip pain, RN notified. The patient politely refused out of bed this PM due to fatigue and pain; agreeable to exercises with motivation. Pt needed physical assist for all RLE exercises, instructed to perform ankle pumps quad sets and hip returning to neutral during the rest of the day. Pt would benefit from further skilled PT intervention to progress towards goals are able.    Follow Up Recommendations  SNF     Equipment Recommendations  Other (comment)(TBD)    Recommendations for Other Services OT consult     Precautions / Restrictions Precautions Precautions: Fall Restrictions Weight Bearing Restrictions: Yes RLE Weight Bearing: Weight bearing as tolerated    Mobility  Bed Mobility               General bed mobility comments: deferred, pt with increased pain, reported too fatigued to mobilize this PM  Transfers                    Ambulation/Gait                 Stairs             Wheelchair Mobility    Modified Rankin (Stroke Patients Only)       Balance                                            Cognition Arousal/Alertness: Awake/alert Behavior During Therapy: WFL for tasks assessed/performed Overall Cognitive Status: Within Functional Limits for tasks assessed                                        Exercises General Exercises - Lower Extremity Ankle Circles/Pumps: AROM;Both;10 reps Quad Sets: AROM;Both;10 reps Short Arc Quad: AAROM;Strengthening;Right;10 reps Heel Slides:  AAROM;Strengthening;Right;10 reps;AROM;Left Hip ABduction/ADduction: AAROM;Strengthening;Right;10 reps;AROM;Left    General Comments        Pertinent Vitals/Pain Pain Assessment: Faces Faces Pain Scale: Hurts worst Pain Location: R hip Pain Descriptors / Indicators: Aching;Crying;Grimacing;Guarding;Moaning Pain Intervention(s): Limited activity within patient's tolerance;Monitored during session;Repositioned    Home Living                      Prior Function            PT Goals (current goals can now be found in the care plan section) Progress towards PT goals: Progressing toward goals    Frequency    BID      PT Plan Current plan remains appropriate    Co-evaluation              AM-PAC PT "6 Clicks" Mobility   Outcome Measure  Help needed turning from your back to your side while in a flat bed without using bedrails?: A Lot Help needed moving from lying on your back to sitting on the side of a flat bed  without using bedrails?: Total Help needed moving to and from a bed to a chair (including a wheelchair)?: Total Help needed standing up from a chair using your arms (e.g., wheelchair or bedside chair)?: A Lot Help needed to walk in hospital room?: Total Help needed climbing 3-5 steps with a railing? : Total 6 Click Score: 8    End of Session Equipment Utilized During Treatment: Gait belt Activity Tolerance: Patient limited by pain Patient left: in bed;with call bell/phone within reach;with bed alarm set Nurse Communication: Mobility status PT Visit Diagnosis: Other abnormalities of gait and mobility (R26.89);Muscle weakness (generalized) (M62.81);Difficulty in walking, not elsewhere classified (R26.2);Pain Pain - Right/Left: Right Pain - part of body: Hip     Time: 1416-1430 PT Time Calculation (min) (ACUTE ONLY): 14 min  Charges:  $Therapeutic Exercise: 8-22 mins                     Lieutenant Diego PT, DPT 2:47 PM,11/21/19

## 2019-11-21 NOTE — Progress Notes (Signed)
Subjective:  Patient reports pain as mild to moderate.  Doing well.  Objective:   VITALS:   Vitals:   11/20/19 2043 11/20/19 2144 11/20/19 2244 11/21/19 0341  BP: (!) 92/54 114/67 (!) 112/58 (!) 90/54  Pulse: 81 92 (!) 105 73  Resp: 18 18 18 19   Temp: 98.4 F (36.9 C) 98.7 F (37.1 C) 98.9 F (37.2 C) 98.9 F (37.2 C)  TempSrc: Oral Oral Oral Oral  SpO2: 100% 100% 97% 98%  Weight:      Height:        PHYSICAL EXAM:  Neurologically intact ABD soft Neurovascular intact Sensation intact distally Intact pulses distally Dorsiflexion/Plantar flexion intact Incision: dressing C/D/I No cellulitis present Compartment soft  LABS  Results for orders placed or performed during the hospital encounter of 11/20/19 (from the past 24 hour(s))  Respiratory Panel by RT PCR (Flu A&B, Covid) - Nasopharyngeal Swab     Status: None   Collection Time: 11/20/19  7:21 AM   Specimen: Nasopharyngeal Swab  Result Value Ref Range   SARS Coronavirus 2 by RT PCR NEGATIVE NEGATIVE   Influenza A by PCR NEGATIVE NEGATIVE   Influenza B by PCR NEGATIVE NEGATIVE  CBC with Differential     Status: Abnormal   Collection Time: 11/20/19  8:00 AM  Result Value Ref Range   WBC 5.1 4.0 - 10.5 K/uL   RBC 3.71 (L) 4.22 - 5.81 MIL/uL   Hemoglobin 11.7 (L) 13.0 - 17.0 g/dL   HCT 11/22/19 (L) 66.0 - 63.0 %   MCV 98.4 80.0 - 100.0 fL   MCH 31.5 26.0 - 34.0 pg   MCHC 32.1 30.0 - 36.0 g/dL   RDW 16.0 10.9 - 32.3 %   Platelets 151 150 - 400 K/uL   nRBC 0.0 0.0 - 0.2 %   Neutrophils Relative % 69 %   Neutro Abs 3.6 1.7 - 7.7 K/uL   Lymphocytes Relative 19 %   Lymphs Abs 1.0 0.7 - 4.0 K/uL   Monocytes Relative 9 %   Monocytes Absolute 0.4 0.1 - 1.0 K/uL   Eosinophils Relative 1 %   Eosinophils Absolute 0.0 0.0 - 0.5 K/uL   Basophils Relative 1 %   Basophils Absolute 0.0 0.0 - 0.1 K/uL   Immature Granulocytes 1 %   Abs Immature Granulocytes 0.03 0.00 - 0.07 K/uL  Comprehensive metabolic panel     Status:  Abnormal   Collection Time: 11/20/19  8:00 AM  Result Value Ref Range   Sodium 139 135 - 145 mmol/L   Potassium 3.3 (L) 3.5 - 5.1 mmol/L   Chloride 103 98 - 111 mmol/L   CO2 26 22 - 32 mmol/L   Glucose, Bld 124 (H) 70 - 99 mg/dL   BUN 27 (H) 8 - 23 mg/dL   Creatinine, Ser 11/22/19 0.61 - 1.24 mg/dL   Calcium 8.9 8.9 - 3.22 mg/dL   Total Protein 7.2 6.5 - 8.1 g/dL   Albumin 4.0 3.5 - 5.0 g/dL   AST 19 15 - 41 U/L   ALT 11 0 - 44 U/L   Alkaline Phosphatase 55 38 - 126 U/L   Total Bilirubin 0.9 0.3 - 1.2 mg/dL   GFR calc non Af Amer >60 >60 mL/min   GFR calc Af Amer >60 >60 mL/min   Anion gap 10 5 - 15  Protime-INR     Status: None   Collection Time: 11/20/19  8:00 AM  Result Value Ref Range   Prothrombin Time 13.0  11.4 - 15.2 seconds   INR 1.0 0.8 - 1.2  APTT     Status: None   Collection Time: 11/20/19  8:00 AM  Result Value Ref Range   aPTT 30 24 - 36 seconds  Type and screen [facility]     Status: None   Collection Time: 11/20/19  8:01 AM  Result Value Ref Range   ABO/RH(D) A POS    Antibody Screen NEG    Sample Expiration      11/23/2019,2359 Performed at [facility], [address] [place], [place] [address]   Brain natriuretic peptide     Status: Abnormal   Collection Time: 11/20/19  8:04 AM  Result Value Ref Range   B Natriuretic Peptide 286.0 (H) 0.0 - 100.0 pg/mL    DG Chest 1 View  Result Date: 11/20/2019 CLINICAL DATA:  Larey Seat. Right hip pain. Suspected fracture. EXAM: CHEST  1 VIEW COMPARISON:  Chest x-ray 05/13/2018 FINDINGS: The heart is enlarged but stable. There is tortuosity and mild ectasia of the thoracic aorta. Chronic bronchitic type interstitial lung changes but no acute pulmonary findings. No pleural effusions. The bony thorax appears intact. Stable radiopaque foreign body noted in the right axilla. IMPRESSION: No acute cardiopulmonary findings. Stable cardiac enlargement. Electronically Signed   By: Rudie Meyer M.D.    On: 11/20/2019 08:57   CT Head Wo Contrast  Result Date: 11/20/2019 CLINICAL DATA:  Pt presents to ED via [facility] with c/o fall. Per EMS pt was putting his pants on when he fell onto the floor. Per EMS pt with outward rotation baseline, however rotation worse after fall, and shortening to R leg, swelling is baseli.*comment was truncated*Headache, post traumatic EXAM: CT HEAD WITHOUT CONTRAST TECHNIQUE: Contiguous axial images were obtained from the base of the skull through the vertex without intravenous contrast. COMPARISON:  07/25/2018 FINDINGS: Brain: No acute intracranial hemorrhage. No focal mass lesion. No CT evidence of acute infarction. No midline shift or mass effect. No hydrocephalus. Basilar cisterns are patent. Chronic low-density extra-axial fluid collection over the LEFT frontal lobe not changed from prior. There are periventricular and subcortical white matter hypodensities. Generalized cortical atrophy. Vascular: No hyperdense vessel or unexpected calcification. Skull: Normal. Negative for fracture or focal lesion. Sinuses/Orbits: Paranasal sinuses and mastoid air cells are clear. Orbits are clear. Other: None. IMPRESSION: 1. No acute intracranial trauma. No change from prior. 2. White matter microvascular disease and atrophy. 3. 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 Electronically Signed   By: Genevive Bi M.D.   On: 11/20/2019 09:09   CT Hip Right Wo Contrast  Result Date: 11/20/2019 CLINICAL DATA:  Fall today with right hip pain and swelling. Intertrochanteric right femur fracture. EXAM: CT OF THE RIGHT HIP WITHOUT CONTRAST TECHNIQUE: Multidetector CT imaging of the right hip was performed according to the standard protocol. Multiplanar CT image reconstructions were also generated. COMPARISON:  Radiographs same date. FINDINGS: Bones/Joint/Cartilage Comminuted intertrochanteric right femur fracture is associated with mild superior displacement and varus angulation. The  lesser trochanter is displaced medially by less than 1 cm. There is no significant extension of the fracture into the femoral neck or femoral head. The femoral head is located. No evidence of dislocation of the visualized right hemipelvis. Mild underlying right hip degenerative changes with a small right hip joint effusion. Ligaments Suboptimally assessed by CT. Muscles and Tendons Mild atrophy of the gluteus musculature. No evident tendon tear. Soft tissues No focal periarticular hematoma. There is mild  edema within the subcutaneous fat anterolateral to the right hip. Iliofemoral atherosclerosis and mild sigmoid diverticulosis are noted. IMPRESSION: Comminuted intertrochanteric right femur fracture as described. Underlying mild right hip degenerative changes. Electronically Signed   By: Richardean Sale M.D.   On: 11/20/2019 08:52   DG HIP OPERATIVE UNILAT W OR W/O PELVIS RIGHT  Result Date: 11/20/2019 CLINICAL DATA:  Fracture fixation. EXAM: OPERATIVE left HIP (WITH PELVIS IF PERFORMED) 4 VIEWS TECHNIQUE: Fluoroscopic spot image(s) were submitted for interpretation post-operatively. COMPARISON:  Radiographs 11/20/2019 FINDINGS: Fluoroscopic spot images demonstrate fixation of the intertrochanteric fracture with a intramedullary gamma nail and a fixating proximal dynamic hip screw. Good position and alignment without complicating features. IMPRESSION: Internal fixation of intertrochanteric fracture. Electronically Signed   By: Marijo Sanes M.D.   On: 11/20/2019 15:38   DG Hip Unilat W or Wo Pelvis 2-3 Views Right  Result Date: 11/20/2019 CLINICAL DATA:  Fall today. Right hip pain and swelling. EXAM: DG HIP (WITH OR WITHOUT PELVIS) 2-3V RIGHT COMPARISON:  None. FINDINGS: The bones appear mildly demineralized. There is a comminuted and mildly displaced intertrochanteric right femur fracture. No dislocation or pelvic fracture identified. There are minimal degenerative changes of both hips and sacroiliac  joints. Greater degenerative changes are noted within the lower lumbar spine. IMPRESSION: Comminuted and mildly displaced intertrochanteric right femur fracture. Electronically Signed   By: Richardean Sale M.D.   On: 11/20/2019 08:54    Assessment/Plan: 1 Day Post-Op   Principal Problem:   Closed comminuted fracture of right hip (HCC) Active Problems:   Hypertension   Chronic systolic CHF (congestive heart failure) (HCC)   GERD (gastroesophageal reflux disease)   Fall   Hypokalemia   PAF (paroxysmal atrial fibrillation) (HCC)   Closed right hip fracture (HCC)   Advance diet Up with therapy  WBAT RLE Lovenox aspirin 81 mg daily x 14 days post op Continue pain control Oxycodone Discharge per medicine SNF vs patient prefers home with HHPT if possible Will see tomorrow  Carlynn Spry , PA-C 11/21/2019, 6:47 AM

## 2019-11-21 NOTE — Progress Notes (Signed)
OT Cancellation Note  Patient Details Name: Danny Brady MRN: [id number] DOB: 11-30-1926   Cancelled Treatment:    Reason Eval/Treat Not Completed: Fatigue/lethargy limiting ability to participate. Consult received, chart reviewed. Spoke with PT who just completed a therapy session with pt. Per PT, pt significantly fatigued, back on 2L O2. Will hold OT evaluation at this time and re-attempt at later date/time as pt is medically appropriate and able to tolerate.   Richrd Prime, MPH, MS, OTR/L ascom [phone] 11/21/19, 3:12 PM

## 2019-11-21 NOTE — NC FL2 (Signed)
[place] MEDICAID FL2 LEVEL OF CARE SCREENING TOOL     IDENTIFICATION  Patient Name: Danny Brady Birthdate: 04/02/1927 Sex: male Admission Date (Current Location): 11/20/2019  [place] and [place] Number:  Chiropodist and Address:  [facility], [address], [place], [place] [address]      Provider Number: [id number]  Attending Physician Name and Address:  Arnetha Courser, MD  Relative Name and Phone Number:  Bo Merino [phone]    Current Level of Care: Hospital Recommended Level of Care: Skilled Nursing Facility Prior Approval Number:    Date Approved/Denied:   PASRR Number: [id number]  Discharge Plan: SNF    Current Diagnoses: Patient Active Problem List   Diagnosis Date Noted  . Closed right hip fracture (HCC) 11/20/2019  . Hypertension   . Chronic systolic CHF (congestive heart failure) (HCC)   . GERD (gastroesophageal reflux disease)   . Fall   . Closed comminuted fracture of right hip (HCC)   . Hypokalemia   . PAF (paroxysmal atrial fibrillation) (HCC)     Orientation RESPIRATION BLADDER Height & Weight     Self, Time, Situation, Place  Normal, O2(1 liter acute) Continent Weight: 87 kg Height:  5\' 8"  (172.7 cm)  BEHAVIORAL SYMPTOMS/MOOD NEUROLOGICAL BOWEL NUTRITION STATUS      Continent Diet  AMBULATORY STATUS COMMUNICATION OF NEEDS Skin   Extensive Assist Verbally Normal, Surgical wounds                       Personal Care Assistance Level of Assistance  Bathing, Dressing Bathing Assistance: Limited assistance   Dressing Assistance: Maximum assistance     Functional Limitations Info             SPECIAL CARE FACTORS FREQUENCY  PT (By licensed PT)     PT Frequency: 5 times per week              Contractures Contractures Info: Not present    Additional Factors Info  Allergies   Allergies Info: Prednison, Tamsulosin           Current Medications (11/21/2019):  This is the  current hospital active medication list Current Facility-Administered Medications  Medication Dose Route Frequency Provider Last Rate Last Admin  . acetaminophen (TYLENOL) tablet 650 mg  650 mg Oral Q6H PRN 11/23/2019, MD   650 mg at 11/21/19 0905  . aspirin EC tablet 81 mg  81 mg Oral Daily 11/23/19, MD   81 mg at 11/21/19 11/23/19  . ceFAZolin (ANCEF) IVPB 1 g/50 mL premix  1 g Intravenous Q6H 3149, MD 100 mL/hr at 11/21/19 0557 1 g at 11/21/19 0557  . cephALEXin (KEFLEX) capsule 500 mg  500 mg Oral BID 11/23/19, MD   500 mg at 11/21/19 0905  . docusate sodium (COLACE) capsule 100 mg  100 mg Oral BID 11/23/19, MD   100 mg at 11/21/19 0905  . finasteride (PROSCAR) tablet 5 mg  5 mg Oral Daily 11/23/19, MD   5 mg at 11/21/19 11/23/19  . furosemide (LASIX) tablet 40 mg  40 mg Oral Daily 7026, MD   Stopped at 11/20/19 1127  . hydrALAZINE (APRESOLINE) tablet 25 mg  25 mg Oral TID PRN 11/22/19, MD      . lactated ringers bolus 500 mL  500 mL Intravenous Once Lyndle Herrlich, MD      . lactated ringers  infusion   Intravenous Continuous Lovell Sheehan, MD 75 mL/hr at 11/20/19 2136 Rate Verify at 11/20/19 2136  . methocarbamol (ROBAXIN) tablet 500 mg  500 mg Oral Q8H PRN Lovell Sheehan, MD   500 mg at 11/20/19 2353  . metoCLOPramide (REGLAN) tablet 5-10 mg  5-10 mg Oral Q8H PRN Lovell Sheehan, MD       Or  . metoCLOPramide (REGLAN) injection 5-10 mg  5-10 mg Intravenous Q8H PRN Lovell Sheehan, MD      . morphine 2 MG/ML injection 2 mg  2 mg Intravenous Q3H PRN Lovell Sheehan, MD   2 mg at 11/20/19 1258  . ondansetron (ZOFRAN) tablet 4 mg  4 mg Oral Q6H PRN Lovell Sheehan, MD       Or  . ondansetron [facility]) injection 4 mg  4 mg Intravenous Q6H PRN Lovell Sheehan, MD      . oxyCODONE-acetaminophen (PERCOCET/ROXICET) 5-325 MG per tablet 1 tablet  1 tablet Oral Q4H PRN Lovell Sheehan, MD      . pantoprazole (PROTONIX) EC tablet 40 mg  40 mg Oral  Daily Lovell Sheehan, MD   40 mg at 11/21/19 0905  . senna-docusate (Senokot-S) tablet 1 tablet  1 tablet Oral QHS PRN Lovell Sheehan, MD      . spironolactone (ALDACTONE) tablet 12.5 mg  12.5 mg Oral Daily Lovell Sheehan, MD   Stopped at 11/20/19 1128  . tranexamic acid (CYKLOKAPRON) 2,000 mg in sodium chloride 0.9 % 50 mL Topical Application  4,503 mg Topical Once Lovell Sheehan, MD         Discharge Medications: Please see discharge summary for a list of discharge medications.  Relevant Imaging Results:  Relevant Lab Results:   Additional Information SS# [id number]  Su Hilt, RN

## 2019-11-21 NOTE — Consult Note (Signed)
[facility] Cardiology Consultation Note  Patient ID: Danny Brady, MRN: [id number], DOB/AGE: [birth year] 86 y.o. Admit date: 11/20/2019   Date of Consult: 11/21/2019 Primary Physician: Danella Penton, MD Primary Cardiologist: None  Chief Complaint:  Chief Complaint  Patient presents with  . Fall  . Leg Pain   Reason for Consult: Atrial fibrillation  HPI: 86 y.o. male with paroxysmal nonvalvular atrial fibrillation with preventricular contractions and hypertension who has done fairly well at which time the patient was trying to get into his pants and fell over and had a hip fracture.  The patient underwent fracture repair for which the patient has done fairly well and has had no evidence of significant cardiovascular symptoms.  He does have some pain in his leg.  On admission the patient had a chest x-ray showing normal BNP of 286 and an EKG showing atrial fibrillation with preventricular contractions and heart rate of 82 bpm.  This is without medication management controlling atrial fibrillation.  He does have some mild amount of hypertension for which she has been using appropriate medication management including spironolactone and/or furosemide.  Currently he is hemodynamically stable with no evidence of myocardial infarction or congestive heart failure symptoms  Past Medical History:  Diagnosis Date  . Edema   . Hypertension       Surgical History:  Past Surgical History:  Procedure Laterality Date  . BLADDER SURGERY    . HERNIA REPAIR       Home Meds: Prior to Admission medications   Medication Sig Start Date End Date Taking? Authorizing Provider  aspirin 81 MG EC tablet Take 81 mg by mouth daily. 11/26/12  Yes [provider]  cephALEXin (KEFLEX) 500 MG capsule Take 500 mg by mouth 2 (two) times daily. 11/19/19  Yes [provider]  finasteride (PROSCAR) 5 MG tablet Take 5 mg by mouth daily. 11/12/19  Yes [provider]  furosemide (LASIX) 20 MG  tablet Take 40 mg by mouth daily. 07/18/19  Yes [provider]  omeprazole (PRILOSEC) 20 MG capsule Take 20 mg by mouth daily. 10/31/19  Yes [provider]  spironolactone (ALDACTONE) 25 MG tablet Take 0.5 mg by mouth daily. 11/19/19  Yes [provider]  acetaminophen (TYLENOL) 500 MG tablet Take 500 mg by mouth every 6 (six) hours.    [provider]    Inpatient Medications:  . aspirin EC  81 mg Oral Daily  . cephALEXin  500 mg Oral BID  . docusate sodium  100 mg Oral BID  . finasteride  5 mg Oral Daily  . furosemide  40 mg Oral Daily  . pantoprazole  40 mg Oral Daily  . spironolactone  12.5 mg Oral Daily  . tranexamic acid (CYKLOKAPRON) topical -INTRAOP  2,000 mg Topical Once   .  ceFAZolin (ANCEF) IV 1 g (11/21/19 0557)  . lactated ringers 75 mL/hr at 11/20/19 2136    Allergies:  Allergies  Allergen Reactions  . Prednisone Other (See Comments)    Not able to sleep. Not able to sleep.   . Tamsulosin Other (See Comments)    Unknown insomnia     Social History   Socioeconomic History  . Marital status: Widowed    Spouse name: Not on file  . Number of children: Not on file  . Years of education: Not on file  . Highest education level: Not on file  Occupational History  . Not on file  Tobacco Use  . Smoking status:  Never Smoker  . Smokeless tobacco: Never Used  Substance and Sexual Activity  . Alcohol use: Not Currently  . Drug use: Never  . Sexual activity: Not on file  Other Topics Concern  . Not on file  Social History Narrative  . Not on file   Social Determinants of Health   Financial Resource Strain:   . Difficulty of Paying Living Expenses: Not on file  Food Insecurity:   . Worried About Charity fundraiser in the Last Year: Not on file  . Ran Out of Food in the Last Year: Not on file  Transportation Needs:   . Lack of Transportation (Medical): Not on file  . Lack of Transportation (Non-Medical): Not on file   Physical Activity:   . Days of Exercise per Week: Not on file  . Minutes of Exercise per Session: Not on file  Stress:   . Feeling of Stress : Not on file  Social Connections:   . Frequency of Communication with Friends and Family: Not on file  . Frequency of Social Gatherings with Friends and Family: Not on file  . Attends Religious Services: Not on file  . Active Member of Clubs or Organizations: Not on file  . Attends Archivist Meetings: Not on file  . Marital Status: Not on file  Intimate Partner Violence:   . Fear of Current or Ex-Partner: Not on file  . Emotionally Abused: Not on file  . Physically Abused: Not on file  . Sexually Abused: Not on file     History reviewed. No pertinent family history.   Review of Systems Positive for hip pain Negative for: General:  chills, fever, night sweats or weight changes.  Cardiovascular: PND orthopnea syncope dizziness  Dermatological skin lesions rashes Respiratory: Cough congestion Urologic: Frequent urination urination at night and hematuria Abdominal: negative for nausea, vomiting, diarrhea, bright red blood per rectum, melena, or hematemesis Neurologic: negative for visual changes, and/or hearing changes  All other systems reviewed and are otherwise negative except as noted above.  Labs: No results for input(s): CKTOTAL, CKMB, TROPONINI in the last 72 hours. Lab Results  Component Value Date   WBC 5.1 11/20/2019   HGB 11.7 (L) 11/20/2019   HCT 36.5 (L) 11/20/2019   MCV 98.4 11/20/2019   PLT 151 11/20/2019    Recent Labs  Lab 11/20/19 0800  NA 139  K 3.3*  CL 103  CO2 26  BUN 27*  CREATININE 1.07  CALCIUM 8.9  PROT 7.2  BILITOT 0.9  ALKPHOS 55  ALT 11  AST 19  GLUCOSE 124*   No results found for: CHOL, HDL, LDLCALC, TRIG No results found for: DDIMER  Radiology/Studies:  DG Chest 1 View  Result Date: 11/20/2019 CLINICAL DATA:  [place]. Right hip pain. Suspected fracture. EXAM: CHEST  1 VIEW  COMPARISON:  Chest x-ray 05/13/2018 FINDINGS: The heart is enlarged but stable. There is tortuosity and mild ectasia of the thoracic aorta. Chronic bronchitic type interstitial lung changes but no acute pulmonary findings. No pleural effusions. The bony thorax appears intact. Stable radiopaque foreign body noted in the right axilla. IMPRESSION: No acute cardiopulmonary findings. Stable cardiac enlargement. Electronically Signed   By: Marijo Sanes M.D.   On: 11/20/2019 08:57   CT Head Wo Contrast  Result Date: 11/20/2019 CLINICAL DATA:  Pt presents to ED via [facility] with c/o fall. Per EMS pt was putting his pants on when he fell onto the floor. Per EMS pt with outward rotation  baseline, however rotation worse after fall, and shortening to R leg, swelling is baseli.*comment was truncated*Headache, post traumatic EXAM: CT HEAD WITHOUT CONTRAST TECHNIQUE: Contiguous axial images were obtained from the base of the skull through the vertex without intravenous contrast. COMPARISON:  07/25/2018 FINDINGS: Brain: No acute intracranial hemorrhage. No focal mass lesion. No CT evidence of acute infarction. No midline shift or mass effect. No hydrocephalus. Basilar cisterns are patent. Chronic low-density extra-axial fluid collection over the LEFT frontal lobe not changed from prior. There are periventricular and subcortical white matter hypodensities. Generalized cortical atrophy. Vascular: No hyperdense vessel or unexpected calcification. Skull: Normal. Negative for fracture or focal lesion. Sinuses/Orbits: Paranasal sinuses and mastoid air cells are clear. Orbits are clear. Other: None. IMPRESSION: 1. No acute intracranial trauma. No change from prior. 2. White matter microvascular disease and atrophy. 3. 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 Electronically Signed   By: Genevive Bi M.D.   On: 11/20/2019 09:09   CT Hip Right Wo Contrast  Result Date: 11/20/2019 CLINICAL DATA:  Fall today with right  hip pain and swelling. Intertrochanteric right femur fracture. EXAM: CT OF THE RIGHT HIP WITHOUT CONTRAST TECHNIQUE: Multidetector CT imaging of the right hip was performed according to the standard protocol. Multiplanar CT image reconstructions were also generated. COMPARISON:  Radiographs same date. FINDINGS: Bones/Joint/Cartilage Comminuted intertrochanteric right femur fracture is associated with mild superior displacement and varus angulation. The lesser trochanter is displaced medially by less than 1 cm. There is no significant extension of the fracture into the femoral neck or femoral head. The femoral head is located. No evidence of dislocation of the visualized right hemipelvis. Mild underlying right hip degenerative changes with a small right hip joint effusion. Ligaments Suboptimally assessed by CT. Muscles and Tendons Mild atrophy of the gluteus musculature. No evident tendon tear. Soft tissues No focal periarticular hematoma. There is mild edema within the subcutaneous fat anterolateral to the right hip. Iliofemoral atherosclerosis and mild sigmoid diverticulosis are noted. IMPRESSION: Comminuted intertrochanteric right femur fracture as described. Underlying mild right hip degenerative changes. Electronically Signed   By: Carey Bullocks M.D.   On: 11/20/2019 08:52   DG HIP OPERATIVE UNILAT W OR W/O PELVIS RIGHT  Result Date: 11/20/2019 CLINICAL DATA:  Fracture fixation. EXAM: OPERATIVE left HIP (WITH PELVIS IF PERFORMED) 4 VIEWS TECHNIQUE: Fluoroscopic spot image(s) were submitted for interpretation post-operatively. COMPARISON:  Radiographs 11/20/2019 FINDINGS: Fluoroscopic spot images demonstrate fixation of the intertrochanteric fracture with a intramedullary gamma nail and a fixating proximal dynamic hip screw. Good position and alignment without complicating features. IMPRESSION: Internal fixation of intertrochanteric fracture. Electronically Signed   By: Rudie Meyer M.D.   On: 11/20/2019  15:38   DG Hip Unilat W or Wo Pelvis 2-3 Views Right  Result Date: 11/20/2019 CLINICAL DATA:  Fall today. Right hip pain and swelling. EXAM: DG HIP (WITH OR WITHOUT PELVIS) 2-3V RIGHT COMPARISON:  None. FINDINGS: The bones appear mildly demineralized. There is a comminuted and mildly displaced intertrochanteric right femur fracture. No dislocation or pelvic fracture identified. There are minimal degenerative changes of both hips and sacroiliac joints. Greater degenerative changes are noted within the lower lumbar spine. IMPRESSION: Comminuted and mildly displaced intertrochanteric right femur fracture. Electronically Signed   By: Carey Bullocks M.D.   On: 11/20/2019 08:54    EKG: Atrial fibrillation with preventricular contractions and nonspecific ST changes and a heart rate of 82 bpm  Weights: Filed Weights   11/20/19 0718 11/20/19 1355  Weight:  87.1 kg 87 kg     Physical Exam: Blood pressure 101/61, pulse 85, temperature 98.6 F (37 C), resp. rate 16, height 5\' 8"  (1.727 m), weight 87 kg, SpO2 98 %. Body mass index is 29.16 kg/m. General: Well developed, well nourished, in no acute distress. Head eyes ears nose throat: Normocephalic, atraumatic, sclera non-icteric, no xanthomas, nares are without discharge. No apparent thyromegaly and/or mass  Lungs: Normal respiratory effort.  no wheezes, no rales, no rhonchi.  Heart: Irregular with normal S1 S2. no murmur gallop, no rub, PMI is normal size and placement, carotid upstroke normal without bruit, jugular venous pressure is normal Abdomen: Soft, non-tender, non-distended with normoactive bowel sounds. No hepatomegaly. No rebound/guarding. No obvious abdominal masses. Abdominal aorta is normal size without bruit Extremities: No edema. no cyanosis, no clubbing, no ulcers  Peripheral : 2+ bilateral upper extremity pulses, 2+ bilateral femoral pulses, 2+ bilateral dorsal pedal pulse Neuro: Alert and oriented. No facial asymmetry. No focal  deficit. Moves all extremities spontaneously. Musculoskeletal: Normal muscle tone without kyphosis Psych:  Responds to questions appropriately with a normal affect.    Assessment: 86 year old male with paroxysmal nonvalvular atrial fibrillation without evidence of significant symptoms at this time having hip fracture needing further medical management  Plan: 1.  Continue following telemetry and heart rate control of atrial fibrillation and no additional beta-blocker or other medications for heart rate control unless above 100 bpm consistently 2.  Anticoagulation for further risk reduction in stroke with atrial fibrillation as well as deep venous thrombosis from hip fracture using Eliquis 5 mg twice per day 3.  No restrictions to rehabilitation from hip fracture 4.  Will further adjust medication management after rehabilitation as an outpatient  Signed, 95 M.D. [facility] Cardiology 11/21/2019, 8:45 AM

## 2019-11-21 NOTE — Evaluation (Signed)
Physical Therapy Evaluation Patient Details Name: Danny Brady MRN: [id number] DOB: September 02, 1927 Today's Date: 11/21/2019   History of Present Illness  Pt is 86 yo male s/p IM nailing for R hip fx. 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    Clinical Impression  Pt alert, reported 10/10 hip pain at start of session, RN in room to administer pain medication. The patient stated he lives alone and has a multitude of people that check in on him. Independent/mod I for ADLs.   The patient needed physical assistance for all RLE exercises this session, though ability to move LE improved with repetitions. Supine to sit with maxA and heavy use of bed rails. Pt sat EOB for several minutes to assess response, spO2, BP (unable to get reading). Pt with heavy L lean to unweight RLE during sitting, bilateral UE support needed. Sit <> Stand with minAx2 and RW, elevated bed surface. Several side steps attempted, pt with significant difficulty with RLE weight bearing as well as foot clearance, physical assistance needed. Pt fatigued at end of session, repositioned in bed maxAx2.  Overall the patient demonstrated deficits (see "PT Problem List") that impede the patient's functional abilities, safety, and mobility and would benefit from skilled PT intervention.      Follow Up Recommendations SNF    Equipment Recommendations  Other (comment)(TBD)    Recommendations for Other Services OT consult     Precautions / Restrictions Precautions Precautions: Fall Restrictions Weight Bearing Restrictions: Yes RLE Weight Bearing: Weight bearing as tolerated      Mobility  Bed Mobility Overal bed mobility: Needs Assistance Bed Mobility: Supine to Sit;Sit to Supine     Supine to sit: Max assist;HOB elevated Sit to supine: Max assist;+2 for physical assistance      Transfers Overall transfer level: Needs assistance Equipment  used: Rolling walker (2 wheeled) Transfers: Sit to/from Stand Sit to Stand: Min assist;+2 physical assistance;From elevated surface            Ambulation/Gait Ambulation/Gait assistance: Mod assist;+2 physical assistance Gait Distance (Feet): 1 Feet Assistive device: Rolling walker (2 wheeled)       General Gait Details: took several shuffled steps towards HOB. Difficulty with L foot clerance, physical assist needed to move LE  Stairs            Wheelchair Mobility    Modified Rankin (Stroke Patients Only)       Balance Overall balance assessment: Needs assistance Sitting-balance support: Feet supported;Bilateral upper extremity supported Sitting balance-Leahy Scale: Poor Sitting balance - Comments: off weighting R hip entirety of sitting Postural control: Left lateral lean   Standing balance-Leahy Scale: Zero                               Pertinent Vitals/Pain Pain Assessment: 0-10 Pain Score: 10-Worst pain ever Pain Location: R hip Pain Descriptors / Indicators: Aching;Crying;Grimacing;Guarding Pain Intervention(s): Limited activity within patient's tolerance;Monitored during session;Repositioned;RN gave pain meds during session    Home Living Family/patient expects to be discharged to:: Private residence Living Arrangements: Alone Available Help at Discharge: Available PRN/intermittently;Family Type of Home: House Home Access: Level entry     Home Layout: One level Home Equipment: [place] - 2 wheels;Walker - 4 wheels;Cane - single point      Prior Function Level of Independence: Independent with assistive device(s)  Hand Dominance        Extremity/Trunk Assessment   Upper Extremity Assessment Upper Extremity Assessment: Generalized weakness(Pt with chronic shoulder issues, difficulty with abduction/flexion bilaterally)    Lower Extremity Assessment Lower Extremity Assessment: Generalized weakness;RLE  deficits/detail;LLE deficits/detail RLE Deficits / Details: AAROM for all movements LLE Deficits / Details: generalized weakness but able to lift against gravity       Communication   Communication: No difficulties  Cognition Arousal/Alertness: Awake/alert Behavior During Therapy: WFL for tasks assessed/performed Overall Cognitive Status: Within Functional Limits for tasks assessed                                        General Comments      Exercises General Exercises - Lower Extremity Ankle Circles/Pumps: AROM;Both;5 reps Short Arc Quad: AAROM;Strengthening;Right;10 reps Heel Slides: AAROM;Strengthening;Right;10 reps;AROM;Left Hip ABduction/ADduction: AAROM;Strengthening;Right;10 reps;AROM;Left   Assessment/Plan    PT Assessment Patient needs continued PT services  PT Problem List Decreased strength;Decreased mobility;Decreased range of motion;Decreased knowledge of precautions;Decreased activity tolerance;Decreased balance;Pain;Decreased knowledge of use of DME       PT Treatment Interventions DME instruction;Therapeutic exercise;Gait training;Balance training;Stair training;Neuromuscular re-education;Functional mobility training;Therapeutic activities;Patient/family education    PT Goals (Current goals can be found in the Care Plan section)  Acute Rehab PT Goals Patient Stated Goal: Patient wants to go home PT Goal Formulation: With patient Time For Goal Achievement: 12/05/19 Potential to Achieve Goals: Good    Frequency BID   Barriers to discharge Decreased caregiver support      Co-evaluation               AM-PAC PT "6 Clicks" Mobility  Outcome Measure Help needed turning from your back to your side while in a flat bed without using bedrails?: A Lot Help needed moving from lying on your back to sitting on the side of a flat bed without using bedrails?: Total Help needed moving to and from a bed to a chair (including a wheelchair)?:  Total Help needed standing up from a chair using your arms (e.g., wheelchair or bedside chair)?: A Lot Help needed to walk in hospital room?: Total Help needed climbing 3-5 steps with a railing? : Total 6 Click Score: 8    End of Session Equipment Utilized During Treatment: Gait belt Activity Tolerance: Patient limited by pain Patient left: in bed;with call bell/phone within reach;with bed alarm set Nurse Communication: Mobility status PT Visit Diagnosis: Other abnormalities of gait and mobility (R26.89);Muscle weakness (generalized) (M62.81);Difficulty in walking, not elsewhere classified (R26.2);Pain Pain - Right/Left: Right Pain - part of body: Hip    Time: 3300-7622 PT Time Calculation (min) (ACUTE ONLY): 42 min   Charges:   PT Evaluation $PT Eval Low Complexity: 1 Low PT Treatments $Therapeutic Exercise: 23-37 mins       Olga Coaster PT, DPT 9:59 AM,11/21/19

## 2019-11-21 NOTE — Progress Notes (Signed)
Initial Nutrition Assessment  DOCUMENTATION CODES:   Not applicable  INTERVENTION:  -Ensure Enlive po BID, each supplement provides 350 kcal and 20 grams of protein  -Magic cup BID with meals, each supplement provides 290 kcal and 9 grams of protein  -1 packet Juven BID, each packet provides 95 calories, 2.5 grams of protein (collagen), and 9.8 grams of carbohydrate (3 grams sugar); also contains 7 grams of L-arginine and L-glutamine, 300 mg vitamin C, 15 mg vitamin E, 1.2 mcg vitamin B-12, 9.5 mg zinc, 200 mg calcium, and 1.5 g  Calcium Beta-hydroxy-Beta-methylbutyrate to support wound healing  -MVI with minerals daily   NUTRITION DIAGNOSIS:   Increased nutrient needs related to post-op healing(right hip fracture s/p IM nailing) as evidenced by estimated needs.  GOAL:   Patient will meet greater than or equal to 90% of their needs   MONITOR:   Labs, I & O's, Skin, Supplement acceptance, PO intake, Weight trends  REASON FOR ASSESSMENT:   Consult Hip fracture protocol  ASSESSMENT:  RD working remotely.  86 year old male with past medical history of HTN, GERD, atrial fibrillation, CHF (EF 45%) who presented to ED for evaluation of right hip pain s/p fall at home. Patient reports that he fell while trying to put on his pants and unable to ambulate since due to pain. In ED, right leg noted to be externally rotated, X-ray of right hip/pelvis and CT of right hip showed comminuted intertrochanteric right femur fracture.  Patient is s/p right IM nail intertrochanteric on 1/27  Per notes, pt doing well today and reports mild to moderate pain. Patient lives alone  and is independent for most ADL's at baseline. Recommendations for SNF per PT pending bed offers.   RD attempted to reach pt via phone and reached busy signal. Will attempt to reach pt later this afternoon as time allows. Patient on regular diet, no recorded meals at this time for review. Will provide Ensure and Magic Cup  nutrition supplements to aid with calorie/protein needs. Patient seen by wound care for noted surgical excision with biopsy of cancerous lesion to right shoulder on 1/26 (date of procedure per pt report). RD to provide Juven to support wound healing.  Current wt 191.4 lbs No recent wt history available for review, last weight prior to admission 88 kg in 2019  I/Os: +777 x 24 hrs   UOP: 475 x 24 hrs  Medications reviewed and include: Colace, Lasix, Protonix  Labs reviewed  NUTRITION - FOCUSED PHYSICAL EXAM: Unable to complete at this time, RD working remotely.   Diet Order:   Diet Order            Diet regular Room service appropriate? Yes; Fluid consistency: Thin  Diet effective now              EDUCATION NEEDS:   No education needs have been identified at this time  Skin:  Skin Assessment: Reviewed RN Assessment(incision closed; right hip)  Last BM:  1/25  Height:   Ht Readings from Last 1 Encounters:  11/20/19 5\' 8"  (1.727 m)    Weight:   Wt Readings from Last 1 Encounters:  11/20/19 87 kg    Ideal Body Weight:  70 kg  BMI:  Body mass index is 29.16 kg/m.  Estimated Nutritional Needs:   Kcal:  1800-2000  Protein:  90-100  Fluid:  >/= 1.8 L/day   11/22/19, RD, LDN Clinical Nutrition [employer] Telephone [phone] After Hours/Weekend Pager: [phone]

## 2019-11-21 NOTE — Consult Note (Signed)
WOC Nurse Consult Note: Reason for Consult:Cancerous lesion to right shoulder.  Has nonremovable dressing to left shoulder.  States it is to stay in place for 4 days and then dressing can be changed. Patient states sutures were placed.  I cannot find an encounter note in the electronic medical record.  Patient is still groggy from anesthesia.  I will re-evaluate tomorrow and provide topical orders.   Wound type: surgical excision with biopsy Pressure Injury POANA  [place] MSN, RN, FNP-BC [facility]ON Wound, Ostomy, Continence Nurse Pager [phone]

## 2019-11-21 NOTE — Consult Note (Signed)
ANTICOAGULATION CONSULT NOTE - Initial Consult  Pharmacy Consult for Apixaban Dosing  Indication: atrial fibrillation  Allergies  Allergen Reactions  . Prednisone Other (See Comments)    Not able to sleep. Not able to sleep.   . Tamsulosin Other (See Comments)    Unknown insomnia     Patient Measurements: Height: 5\' 8"  (172.7 cm) Weight: 191 lb 12.8 oz (87 kg) IBW/kg (Calculated) : 68.4  Vital Signs: Temp: 98.6 F (37 C) (01/28 0809) Temp Source: Oral (01/28 0341) BP: 106/50 (01/28 1131) Pulse Rate: 74 (01/28 1131)  Labs: Recent Labs    11/20/19 0800 11/21/19 0803  HGB 11.7* 7.0*  HCT 36.5* 21.8*  PLT 151 132*  APTT 30  --   LABPROT 13.0  --   INR 1.0  --   CREATININE 1.07 1.11    Estimated Creatinine Clearance: 47.4 mL/min (by C-G formula based on SCr of 1.11 mg/dL).   Medical History: Past Medical History:  Diagnosis Date  . Edema   . Hypertension    Assessment: Pharmacy consulted for apixaban dosing and monitoring for 86 yo male with PMH A. Fib. Patient admitted with close comminuted fracture of right hip, S/P ORIF 1/27.  Hgb: 11.7>> 7.0  Plts: 151>> 131     Plan:  After discussion with provider, Apixaban will be started 1/29 AM. Patient will receive transfusion today. CBC/Hgh ordered with AM labs.  Will order apixaban 5mg  BID to start 1/29 AM.   , PharmD, BCPS Clinical Pharmacist 11/21/2019 2:04 PM

## 2019-11-22 LAB — CBC
HCT: 21.6 % — ABNORMAL LOW (ref 39.0–52.0)
Hemoglobin: 7.1 g/dL — ABNORMAL LOW (ref 13.0–17.0)
MCH: 31.7 pg (ref 26.0–34.0)
MCHC: 32.9 g/dL (ref 30.0–36.0)
MCV: 96.4 fL (ref 80.0–100.0)
Platelets: 129 10*3/uL — ABNORMAL LOW (ref 150–400)
RBC: 2.24 MIL/uL — ABNORMAL LOW (ref 4.22–5.81)
RDW: 15 % (ref 11.5–15.5)
WBC: 9.4 10*3/uL (ref 4.0–10.5)
nRBC: 0.2 % (ref 0.0–0.2)

## 2019-11-22 LAB — BASIC METABOLIC PANEL
Anion gap: 5 (ref 5–15)
BUN: 27 mg/dL — ABNORMAL HIGH (ref 8–23)
CO2: 26 mmol/L (ref 22–32)
Calcium: 8.2 mg/dL — ABNORMAL LOW (ref 8.9–10.3)
Chloride: 104 mmol/L (ref 98–111)
Creatinine, Ser: 0.96 mg/dL (ref 0.61–1.24)
GFR calc Af Amer: >60 mL/min (ref >60)
GFR calc non Af Amer: >60 mL/min (ref >60)
Glucose, Bld: 132 mg/dL — ABNORMAL HIGH (ref 70–99)
Potassium: 4.1 mmol/L (ref 3.5–5.1)
Sodium: 135 mmol/L (ref 135–145)

## 2019-11-22 LAB — MAGNESIUM: Magnesium: 2 mg/dL (ref 1.7–2.4)

## 2019-11-22 LAB — HEMOGLOBIN AND HEMATOCRIT, BLOOD
HCT: 22.6 % — ABNORMAL LOW (ref 39.0–52.0)
Hemoglobin: 7.1 g/dL — ABNORMAL LOW (ref 13.0–17.0)

## 2019-11-22 MED ORDER — ASPIRIN 81 MG PO TBEC: 81.0000 mg | DELAYED_RELEASE_TABLET | Freq: Every day | ORAL | 0 refills | Status: DC

## 2019-11-22 MED ORDER — OXYCODONE-ACETAMINOPHEN 5-325 MG PO TABS: 1.0000 | ORAL_TABLET | ORAL | 0 refills | Status: DC | PRN

## 2019-11-22 NOTE — Care Management Important Message (Signed)
Important Message  Patient Details  Name: Danny Brady MRN: [id number] Date of Birth: 1927-04-18   Medicare Important Message Given:  Yes     [name] A Noele Icenhour 11/22/2019, 11:19 AM

## 2019-11-22 NOTE — Progress Notes (Addendum)
PROGRESS NOTE    Danny Brady  MBW:[id number] DOB: 02-28-1927 DOA: 11/20/2019 PCP: Danella Penton, MD      Brief Narrative:  Danny Brady is a 86 y.o. M with hx Afib not on AC, sCHF EF 45%, HTN who presented with fall and hip pain.  Patient was putting on his pants, when he lost balance and fell, had immediate right hip pain and could not walk.    In the ER, radiograph showed comminuted RIGHT femur fracture.  CXR clear and ECG showed rate controlled Afib.  Dr. Odis Luster consulted and recommended operative fixation.       Assessment & Plan:  RIGHT hip fracture -Consult Orthopedic surgery -Pt Eval -COVID test prior to discharge   Anemia, acute blood loss, suspect post-surgical Hgb 11 on admission (this apepars to be baselnie from 2019).  Dropped to 7.0 g/dL post-op yesterday.    Transfused 1 unit PRBCs 1/28, today, no bump in Hgb.  No clinical bleeding, melena, hematochezia, surgical site looks fine, although hip swollen.  Suspect intra-thigh hematoma after discussion with orthopedics.    -Hold ELiquis -Trend Hgb    Atrial fibrillation, persistent CHA2DS2-Vasc 4.   Will hold Eliquis at present given unstable Hgb but will need to be started as outpatient.  Rate controlled off nodal blocking agents. -Hold eliquis  Hypertension Chronic systolic CHF BP controlled Appears euvolemic but at risk of CHF with transfusion. -Continue furosemide, spironolactone  Hypokalemia Resolved  Recent skin excision -Continue cephalexin   At risk of malnutrition -Continue Ensure  BPH -Continue finasteride       Disposition: The patient was admitted with right hip fracture. He went underwent open reduction internal fixation yesterday, and has not yet been out of bed with physical therapy.  At baseline he is ambulatory and independent for all cares, but currently cannot walk.  He will need extensive rehabilitation from the surgery, likely placement. I will discharge when his  hemoglobin is stabilized (it is still trending down he has required transfusion with the last 24 hours), safe disposition to rehab Ascertain.        MDM: The below labs and imaging reports were reviewed and summarized above.  Medication management as above.   DVT prophylaxis: Aspirin and SCDs for now, will start Eliquis when Hgb stabilizes Code Status: FULL Family Communication: Daughter by phone    Consultants:   Orthopedic Surgery  Cardiology  Procedures:   1/27 ORIF right hip by Dr. Odis Luster  Antimicrobials:   Cephalexin 1.28 >>   Culture data:   None           Subjective: The patient has got severe pain with any movement of the right leg.  He has no fever, confusion, cough, dysuria.  No hematuria or melena.  Objective: Vitals:   11/21/19 2100 11/22/19 0024 11/22/19 0701 11/22/19 0739  BP: 116/60 (!) 108/56  112/62  Pulse: 72 71  76  Resp: 18 17  20   Temp: 99.6 F (37.6 C) 98.3 F (36.8 C)  98.2 F (36.8 C)  TempSrc: Oral     SpO2: 100% 97% 96% 99%  Weight:      Height:        Intake/Output Summary (Last 24 hours) at 11/22/2019 1551 Last data filed at 11/22/2019 1318 Gross per 24 hour  Intake 336 ml  Output 975 ml  Net -639 ml   Filed Weights   11/20/19 0718 11/20/19 1355  Weight: 87.1 kg 87 kg    Examination: General appearance:  Delay adult male, alert and in no acute distress.  Emesis with any movement of the right leg. HEENT: Anicteric, conjunctiva pink, lids and lashes normal. No nasal deformity, discharge, epistaxis.  Lips moist, dentition normal, oropharynx tacky dry, no oral lesions, hearing slightly diminished..   Skin: Warm and dry.  No jaundice.  No suspicious rashes or lesions.  Skin around the right hip incision appears clean dry and intact, no bruising.  The right thigh does appear to be somewhat swollen. Cardiac: RRR, nl L8-V5, soft systolic murmur.  Capillary refill is brisk.  JVP normal. No LE edema.  Radial pulses 2+ and  symmetric. Respiratory: Normal respiratory rate and rhythm.  CTAB without rales or wheezes. Abdomen: Abdomen soft.  No TTP or voluntary guarding. No ascites, distension, hepatosplenomegaly.   MSK: No deformities or effusions.  Right thigh swelling Neuro: Awake and alert.  EOMI, moves upper extremities, lower extremity strength not tested due to pain, face symmetric, extraocular movements intact. Speech fluent.    Psych: Sensorium intact and responding to questions, attention normal. Affect normal.  Judgment and insight appear normal.    Data Reviewed: I have personally reviewed following labs and imaging studies:  CBC: Recent Labs  Lab 11/20/19 0800 11/21/19 0803 11/22/19 0356 11/22/19 0818  WBC 5.1 8.7 9.4  --   NEUTROABS 3.6  --   --   --   HGB 11.7* 7.0* 7.1* 7.1*  HCT 36.5* 21.8* 21.6* 22.6*  MCV 98.4 99.1 96.4  --   PLT 151 132* 129*  --    Basic Metabolic Panel: Recent Labs  Lab 11/20/19 0800 11/21/19 0803 11/22/19 0356  NA 139 136 135  K 3.3* 4.1 4.1  CL 103 103 104  CO2 26 24 26   GLUCOSE 124* 151* 132*  BUN 27* 26* 27*  CREATININE 1.07 1.11 0.96  CALCIUM 8.9 8.0* 8.2*  MG  --  1.6* 2.0   GFR: Estimated Creatinine Clearance: 54.8 mL/min (by C-G formula based on SCr of 0.96 mg/dL). Liver Function Tests: Recent Labs  Lab 11/20/19 0800  AST 19  ALT 11  ALKPHOS 55  BILITOT 0.9  PROT 7.2  ALBUMIN 4.0   No results for input(s): LIPASE, AMYLASE in the last 168 hours. No results for input(s): AMMONIA in the last 168 hours. Coagulation Profile: Recent Labs  Lab 11/20/19 0800  INR 1.0   Cardiac Enzymes: No results for input(s): CKTOTAL, CKMB, CKMBINDEX, TROPONINI in the last 168 hours. BNP (last 3 results) No results for input(s): PROBNP in the last 8760 hours. HbA1C: No results for input(s): HGBA1C in the last 72 hours. CBG: No results for input(s): GLUCAP in the last 168 hours. Lipid Profile: No results for input(s): CHOL, HDL, LDLCALC, TRIG,  CHOLHDL, LDLDIRECT in the last 72 hours. Thyroid Function Tests: No results for input(s): TSH, T4TOTAL, FREET4, T3FREE, THYROIDAB in the last 72 hours. Anemia Panel: Recent Labs    11/21/19 0803 11/21/19 0804  VITAMINB12  --  173*  FOLATE 6.9  --   FERRITIN 180  --   TIBC 210*  --   IRON 27*  --   RETICCTPCT 2.6  --    Urine analysis: No results found for: COLORURINE, APPEARANCEUR, LABSPEC, PHURINE, GLUCOSEU, HGBUR, BILIRUBINUR, KETONESUR, PROTEINUR, UROBILINOGEN, NITRITE, LEUKOCYTESUR Sepsis Labs: @LABRCNTIP (procalcitonin:4,lacticacidven:4)  ) Recent Results (from the past 240 hour(s))  Respiratory Panel by RT PCR (Flu A&B, Covid) - Nasopharyngeal Swab     Status: None   Collection Time: 11/20/19  7:21 AM  Specimen: Nasopharyngeal Swab  Result Value Ref Range Status   SARS Coronavirus 2 by RT PCR NEGATIVE NEGATIVE Final    Comment: (NOTE) SARS-CoV-2 target nucleic acids are NOT DETECTED. The SARS-CoV-2 RNA is generally detectable in upper respiratoy specimens during the acute phase of infection. The lowest concentration of SARS-CoV-2 viral copies this assay can detect is 131 copies/mL. A negative result does not preclude SARS-Cov-2 infection and should not be used as the sole basis for treatment or other patient management decisions. A negative result may occur with  improper specimen collection/handling, submission of specimen other than nasopharyngeal swab, presence of viral mutation(s) within the areas targeted by this assay, and inadequate number of viral copies (<131 copies/mL). A negative result must be combined with clinical observations, patient history, and epidemiological information. The expected result is Negative. Fact Sheet for Patients:  https://www.moore.com/ Fact Sheet for Healthcare Providers:  https://www.young.biz/ This test is not yet ap proved or cleared by the [place] FDA and  has been authorized for  detection and/or diagnosis of SARS-CoV-2 by FDA under an Emergency Use Authorization (EUA). This EUA will remain  in effect (meaning this test can be used) for the duration of the COVID-19 declaration under Section 564(b)(1) of the Act, 21 U.S.C. section 360bbb-3(b)(1), unless the authorization is terminated or revoked sooner.    Influenza A by PCR NEGATIVE NEGATIVE Final   Influenza B by PCR NEGATIVE NEGATIVE Final    Comment: (NOTE) The Xpert Xpress SARS-CoV-2/FLU/RSV assay is intended as an aid in  the diagnosis of influenza from Nasopharyngeal swab specimens and  should not be used as a sole basis for treatment. Nasal washings and  aspirates are unacceptable for Xpert Xpress SARS-CoV-2/FLU/RSV  testing. Fact Sheet for Patients: https://www.moore.com/ Fact Sheet for Healthcare Providers: https://www.young.biz/ This test is not yet approved or cleared by the [place] FDA and  has been authorized for detection and/or diagnosis of SARS-CoV-2 by  FDA under an Emergency Use Authorization (EUA). This EUA will remain  in effect (meaning this test can be used) for the duration of the  Covid-19 declaration under Section 564(b)(1) of the Act, 21  U.S.C. section 360bbb-3(b)(1), unless the authorization is  terminated or revoked. Performed at [facility], [address] [place], [place] [address]          Radiology Studies: No results found.      Scheduled Meds: . sodium chloride   Intravenous Once  . aspirin EC  81 mg Oral Daily  . cephALEXin  500 mg Oral BID  . docusate sodium  100 mg Oral BID  . feeding supplement (ENSURE ENLIVE)  237 mL Oral BID BM  . finasteride  5 mg Oral Daily  . furosemide  40 mg Oral Daily  . multivitamin with minerals  1 tablet Oral Daily  . nutrition supplement (JUVEN)  1 packet Oral BID BM  . pantoprazole  40 mg Oral Daily  . spironolactone  12.5 mg Oral Daily  . tranexamic acid  (CYKLOKAPRON) topical -INTRAOP  2,000 mg Topical Once   Continuous Infusions:   LOS: 2 days    Time spent: 25 minutes    Alberteen Sam, MD [facility] 11/22/2019, 3:51 PM     Please page though AMION or Epic secure chat:  For [employer], Higher education careers adviser

## 2019-11-22 NOTE — Progress Notes (Signed)
Subjective:  Patient reports pain as mild to moderate.  Slow with therapy. Fatigue.  Objective:   VITALS:   Vitals:   11/21/19 1836 11/21/19 2100 11/22/19 0024 11/22/19 0739  BP: 117/64 116/60 (!) 108/56 112/62  Pulse: 75 72 71 76  Resp: 17 18 17 20   Temp: 99.3 F (37.4 C) 99.6 F (37.6 C) 98.3 F (36.8 C) 98.2 F (36.8 C)  TempSrc: Oral Oral    SpO2: 100% 100% 97% 99%  Weight:      Height:        PHYSICAL EXAM:  Neurologically intact ABD soft Neurovascular intact Sensation intact distally Intact pulses distally Dorsiflexion/Plantar flexion intact Incision: scant drainage No cellulitis present Compartment soft Dressing changed  LABS  Results for orders placed or performed during the hospital encounter of 11/20/19 (from the past 24 hour(s))  Magnesium     Status: Abnormal   Collection Time: 11/21/19  8:03 AM  Result Value Ref Range   Magnesium 1.6 (L) 1.7 - 2.4 mg/dL  CBC     Status: Abnormal   Collection Time: 11/21/19  8:03 AM  Result Value Ref Range   WBC 8.7 4.0 - 10.5 K/uL   RBC 2.20 (L) 4.22 - 5.81 MIL/uL   Hemoglobin 7.0 (L) 13.0 - 17.0 g/dL   HCT 21.8 (L) 39.0 - 52.0 %   MCV 99.1 80.0 - 100.0 fL   MCH 31.8 26.0 - 34.0 pg   MCHC 32.1 30.0 - 36.0 g/dL   RDW 14.1 11.5 - 15.5 %   Platelets 132 (L) 150 - 400 K/uL   nRBC 0.0 0.0 - 0.2 %  Basic metabolic panel     Status: Abnormal   Collection Time: 11/21/19  8:03 AM  Result Value Ref Range   Sodium 136 135 - 145 mmol/L   Potassium 4.1 3.5 - 5.1 mmol/L   Chloride 103 98 - 111 mmol/L   CO2 24 22 - 32 mmol/L   Glucose, Bld 151 (H) 70 - 99 mg/dL   BUN 26 (H) 8 - 23 mg/dL   Creatinine, Ser 1.11 0.61 - 1.24 mg/dL   Calcium 8.0 (L) 8.9 - 10.3 mg/dL   GFR calc non Af Amer 58 (L) >60 mL/min   GFR calc Af Amer >60 >60 mL/min   Anion gap 9 5 - 15  Folate     Status: None   Collection Time: 11/21/19  8:03 AM  Result Value Ref Range   Folate 6.9 >5.9 ng/mL  Iron and TIBC     Status: Abnormal    Collection Time: 11/21/19  8:03 AM  Result Value Ref Range   Iron 27 (L) 45 - 182 ug/dL   TIBC 210 (L) 250 - 450 ug/dL   Saturation Ratios 13 (L) 17.9 - 39.5 %   UIBC 183 ug/dL  Ferritin     Status: None   Collection Time: 11/21/19  8:03 AM  Result Value Ref Range   Ferritin 180 24 - 336 ng/mL  Reticulocytes     Status: Abnormal   Collection Time: 11/21/19  8:03 AM  Result Value Ref Range   Retic Ct Pct 2.6 0.4 - 3.1 %   RBC. 2.17 (L) 4.22 - 5.81 MIL/uL   Retic Count, Absolute 56.0 19.0 - 186.0 K/uL   Immature Retic Fract 22.9 (H) 2.3 - 15.9 %  Vitamin B12     Status: Abnormal   Collection Time: 11/21/19  8:04 AM  Result Value Ref Range   Vitamin B-12 173 (  L) 180 - 914 pg/mL  ABO/Rh     Status: None   Collection Time: 11/21/19  9:44 AM  Result Value Ref Range   ABO/RH(D)      A POS Performed at [facility], [address] [place], [place] [address]   Prepare RBC     Status: None   Collection Time: 11/21/19  1:35 PM  Result Value Ref Range   Order Confirmation      ORDER PROCESSED BY BLOOD BANK Performed at [facility], [address] [place], [place] [address]   CBC     Status: Abnormal   Collection Time: 11/22/19  3:56 AM  Result Value Ref Range   WBC 9.4 4.0 - 10.5 K/uL   RBC 2.24 (L) 4.22 - 5.81 MIL/uL   Hemoglobin 7.1 (L) 13.0 - 17.0 g/dL   HCT 78.2 (L) 42.3 - 53.6 %   MCV 96.4 80.0 - 100.0 fL   MCH 31.7 26.0 - 34.0 pg   MCHC 32.9 30.0 - 36.0 g/dL   RDW 14.4 31.5 - 40.0 %   Platelets 129 (L) 150 - 400 K/uL   nRBC 0.2 0.0 - 0.2 %  Magnesium     Status: None   Collection Time: 11/22/19  3:56 AM  Result Value Ref Range   Magnesium 2.0 1.7 - 2.4 mg/dL  Basic metabolic panel     Status: Abnormal   Collection Time: 11/22/19  3:56 AM  Result Value Ref Range   Sodium 135 135 - 145 mmol/L   Potassium 4.1 3.5 - 5.1 mmol/L   Chloride 104 98 - 111 mmol/L   CO2 26 22 - 32 mmol/L   Glucose, Bld 132 (H) 70 - 99 mg/dL   BUN 27 (H) 8 - 23 mg/dL    Creatinine, Ser 8.67 0.61 - 1.24 mg/dL   Calcium 8.2 (L) 8.9 - 10.3 mg/dL   GFR calc non Af Amer >60 >60 mL/min   GFR calc Af Amer >60 >60 mL/min   Anion gap 5 5 - 15    DG Chest 1 View  Result Date: 11/20/2019 CLINICAL DATA:  Larey Seat. Right hip pain. Suspected fracture. EXAM: CHEST  1 VIEW COMPARISON:  Chest x-ray 05/13/2018 FINDINGS: The heart is enlarged but stable. There is tortuosity and mild ectasia of the thoracic aorta. Chronic bronchitic type interstitial lung changes but no acute pulmonary findings. No pleural effusions. The bony thorax appears intact. Stable radiopaque foreign body noted in the right axilla. IMPRESSION: No acute cardiopulmonary findings. Stable cardiac enlargement. Electronically Signed   By: Rudie Meyer M.D.   On: 11/20/2019 08:57   CT Head Wo Contrast  Result Date: 11/20/2019 CLINICAL DATA:  Pt presents to ED via [facility] with c/o fall. Per EMS pt was putting his pants on when he fell onto the floor. Per EMS pt with outward rotation baseline, however rotation worse after fall, and shortening to R leg, swelling is baseli.*comment was truncated*Headache, post traumatic EXAM: CT HEAD WITHOUT CONTRAST TECHNIQUE: Contiguous axial images were obtained from the base of the skull through the vertex without intravenous contrast. COMPARISON:  07/25/2018 FINDINGS: Brain: No acute intracranial hemorrhage. No focal mass lesion. No CT evidence of acute infarction. No midline shift or mass effect. No hydrocephalus. Basilar cisterns are patent. Chronic low-density extra-axial fluid collection over the LEFT frontal lobe not changed from prior. There are periventricular and subcortical white matter hypodensities. Generalized cortical atrophy. Vascular: No hyperdense vessel or unexpected calcification. Skull: Normal. Negative for fracture  or focal lesion. Sinuses/Orbits: Paranasal sinuses and mastoid air cells are clear. Orbits are clear. Other: None. IMPRESSION: 1. No acute intracranial  trauma. No change from prior. 2. White matter microvascular disease and atrophy. 3. 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 Electronically Signed   By: Genevive Bi M.D.   On: 11/20/2019 09:09   CT Hip Right Wo Contrast  Result Date: 11/20/2019 CLINICAL DATA:  Fall today with right hip pain and swelling. Intertrochanteric right femur fracture. EXAM: CT OF THE RIGHT HIP WITHOUT CONTRAST TECHNIQUE: Multidetector CT imaging of the right hip was performed according to the standard protocol. Multiplanar CT image reconstructions were also generated. COMPARISON:  Radiographs same date. FINDINGS: Bones/Joint/Cartilage Comminuted intertrochanteric right femur fracture is associated with mild superior displacement and varus angulation. The lesser trochanter is displaced medially by less than 1 cm. There is no significant extension of the fracture into the femoral neck or femoral head. The femoral head is located. No evidence of dislocation of the visualized right hemipelvis. Mild underlying right hip degenerative changes with a small right hip joint effusion. Ligaments Suboptimally assessed by CT. Muscles and Tendons Mild atrophy of the gluteus musculature. No evident tendon tear. Soft tissues No focal periarticular hematoma. There is mild edema within the subcutaneous fat anterolateral to the right hip. Iliofemoral atherosclerosis and mild sigmoid diverticulosis are noted. IMPRESSION: Comminuted intertrochanteric right femur fracture as described. Underlying mild right hip degenerative changes. Electronically Signed   By: Carey Bullocks M.D.   On: 11/20/2019 08:52   DG HIP OPERATIVE UNILAT W OR W/O PELVIS RIGHT  Result Date: 11/20/2019 CLINICAL DATA:  Fracture fixation. EXAM: OPERATIVE left HIP (WITH PELVIS IF PERFORMED) 4 VIEWS TECHNIQUE: Fluoroscopic spot image(s) were submitted for interpretation post-operatively. COMPARISON:  Radiographs 11/20/2019 FINDINGS: Fluoroscopic spot images demonstrate  fixation of the intertrochanteric fracture with a intramedullary gamma nail and a fixating proximal dynamic hip screw. Good position and alignment without complicating features. IMPRESSION: Internal fixation of intertrochanteric fracture. Electronically Signed   By: Rudie Meyer M.D.   On: 11/20/2019 15:38   DG Hip Unilat W or Wo Pelvis 2-3 Views Right  Result Date: 11/20/2019 CLINICAL DATA:  Fall today. Right hip pain and swelling. EXAM: DG HIP (WITH OR WITHOUT PELVIS) 2-3V RIGHT COMPARISON:  None. FINDINGS: The bones appear mildly demineralized. There is a comminuted and mildly displaced intertrochanteric right femur fracture. No dislocation or pelvic fracture identified. There are minimal degenerative changes of both hips and sacroiliac joints. Greater degenerative changes are noted within the lower lumbar spine. IMPRESSION: Comminuted and mildly displaced intertrochanteric right femur fracture. Electronically Signed   By: Carey Bullocks M.D.   On: 11/20/2019 08:54    Assessment/Plan: 2 Days Post-Op   Principal Problem:   Closed comminuted fracture of right hip (HCC) Active Problems:   Hypertension   Chronic systolic CHF (congestive heart failure) (HCC)   GERD (gastroesophageal reflux disease)   Fall   Hypokalemia   PAF (paroxysmal atrial fibrillation) (HCC)   Closed right hip fracture (HCC)   Advance diet Up with therapy Advance diet Up with therapy  WBAT RLE Aspirin 81 mg daily x 30 days post op Continue pain control Oxycodone Discharge per medicine SNF vs patient prefers home with HHPT if possible Follow up in 2 weeks with DR. Bowers office for staple removal. Call for appt [phone]  Altamese Cabal , PA-C 11/22/2019, 8:01 AM

## 2019-11-22 NOTE — Progress Notes (Signed)
[facility] Encounter Note  Patient: Danny Brady / Admit Date: 11/20/2019 / Date of Encounter: 11/22/2019, 12:16 PM   Subjective: Patient recovering very well from hip surgery.  No evidence of syncope dizziness chest pain shortness of breath or congestive heart failure symptoms.  Heart rate is nice and controlled at this time although not clear whether is still in atrial fibrillation  Review of Systems: Positive for: Hip pain Negative for: Vision change, hearing change, syncope, dizziness, nausea, vomiting,diarrhea, bloody stool, stomach pain, cough, congestion, diaphoresis, urinary frequency, urinary pain,skin lesions, skin rashes Others previously listed  Objective: Telemetry: Unknown Physical Exam: Blood pressure 112/62, pulse 76, temperature 98.2 F (36.8 C), resp. rate 20, height 5\' 8"  (1.727 m), weight 87 kg, SpO2 99 %. Body mass index is 29.16 kg/m. General: Well developed, well nourished, in no acute distress. Head: Normocephalic, atraumatic, sclera non-icteric, no xanthomas, nares are without discharge. Neck: No apparent masses Lungs: Normal respirations with no wheezes, no rhonchi, no rales , no crackles   Heart: Regular rate and rhythm, normal S1 S2, no murmur, no rub, no gallop, PMI is normal size and placement, carotid upstroke normal without bruit, jugular venous pressure normal Abdomen: Soft, non-tender, non-distended with normoactive bowel sounds. No hepatosplenomegaly. Abdominal aorta is normal size without bruit Extremities: No edema, no clubbing, no cyanosis, no ulcers,  Peripheral: 2+ radial, 2+ femoral, 2+ dorsal pedal pulses Neuro: Alert and oriented. Moves all extremities spontaneously. Psych:  Responds to questions appropriately with a normal affect.   Intake/Output Summary (Last 24 hours) at 11/22/2019 1216 Last data filed at 11/22/2019 1031 Gross per 24 hour  Intake 346 ml  Output 850 ml  Net -504 ml    Inpatient Medications:  .  sodium chloride   Intravenous Once  . aspirin EC  81 mg Oral Daily  . cephALEXin  500 mg Oral BID  . docusate sodium  100 mg Oral BID  . feeding supplement (ENSURE ENLIVE)  237 mL Oral BID BM  . finasteride  5 mg Oral Daily  . furosemide  40 mg Oral Daily  . multivitamin with minerals  1 tablet Oral Daily  . nutrition supplement (JUVEN)  1 packet Oral BID BM  . pantoprazole  40 mg Oral Daily  . spironolactone  12.5 mg Oral Daily  . tranexamic acid (CYKLOKAPRON) topical -INTRAOP  2,000 mg Topical Once   Infusions:   Labs: Recent Labs    11/21/19 0803 11/22/19 0356  NA 136 135  K 4.1 4.1  CL 103 104  CO2 24 26  GLUCOSE 151* 132*  BUN 26* 27*  CREATININE 1.11 0.96  CALCIUM 8.0* 8.2*  MG 1.6* 2.0   Recent Labs    11/20/19 0800  AST 19  ALT 11  ALKPHOS 55  BILITOT 0.9  PROT 7.2  ALBUMIN 4.0   Recent Labs    11/20/19 0800 11/20/19 0800 11/21/19 0803 11/21/19 0803 11/22/19 0356 11/22/19 0818  WBC 5.1   < > 8.7  --  9.4  --   NEUTROABS 3.6  --   --   --   --   --   HGB 11.7*   < > 7.0*   < > 7.1* 7.1*  HCT 36.5*   < > 21.8*   < > 21.6* 22.6*  MCV 98.4   < > 99.1  --  96.4  --   PLT 151   < > 132*  --  129*  --    < > =  values in this interval not displayed.   No results for input(s): CKTOTAL, CKMB, TROPONINI in the last 72 hours. Invalid input(s): POCBNP No results for input(s): HGBA1C in the last 72 hours.   Weights: Filed Weights   11/20/19 0718 11/20/19 1355  Weight: 87.1 kg 87 kg     Radiology/Studies:  DG Chest 1 View  Result Date: 11/20/2019 CLINICAL DATA:  Larey Seat. Right hip pain. Suspected fracture. EXAM: CHEST  1 VIEW COMPARISON:  Chest x-ray 05/13/2018 FINDINGS: The heart is enlarged but stable. There is tortuosity and mild ectasia of the thoracic aorta. Chronic bronchitic type interstitial lung changes but no acute pulmonary findings. No pleural effusions. The bony thorax appears intact. Stable radiopaque foreign body noted in the right axilla.  IMPRESSION: No acute cardiopulmonary findings. Stable cardiac enlargement. Electronically Signed   By: Rudie Meyer M.D.   On: 11/20/2019 08:57   CT Head Wo Contrast  Result Date: 11/20/2019 CLINICAL DATA:  Pt presents to ED via [facility] with c/o fall. Per EMS pt was putting his pants on when he fell onto the floor. Per EMS pt with outward rotation baseline, however rotation worse after fall, and shortening to R leg, swelling is baseli.*comment was truncated*Headache, post traumatic EXAM: CT HEAD WITHOUT CONTRAST TECHNIQUE: Contiguous axial images were obtained from the base of the skull through the vertex without intravenous contrast. COMPARISON:  07/25/2018 FINDINGS: Brain: No acute intracranial hemorrhage. No focal mass lesion. No CT evidence of acute infarction. No midline shift or mass effect. No hydrocephalus. Basilar cisterns are patent. Chronic low-density extra-axial fluid collection over the LEFT frontal lobe not changed from prior. There are periventricular and subcortical white matter hypodensities. Generalized cortical atrophy. Vascular: No hyperdense vessel or unexpected calcification. Skull: Normal. Negative for fracture or focal lesion. Sinuses/Orbits: Paranasal sinuses and mastoid air cells are clear. Orbits are clear. Other: None. IMPRESSION: 1. No acute intracranial trauma. No change from prior. 2. White matter microvascular disease and atrophy. 3. 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 Electronically Signed   By: Genevive Bi M.D.   On: 11/20/2019 09:09   CT Hip Right Wo Contrast  Result Date: 11/20/2019 CLINICAL DATA:  Fall today with right hip pain and swelling. Intertrochanteric right femur fracture. EXAM: CT OF THE RIGHT HIP WITHOUT CONTRAST TECHNIQUE: Multidetector CT imaging of the right hip was performed according to the standard protocol. Multiplanar CT image reconstructions were also generated. COMPARISON:  Radiographs same date. FINDINGS: Bones/Joint/Cartilage  Comminuted intertrochanteric right femur fracture is associated with mild superior displacement and varus angulation. The lesser trochanter is displaced medially by less than 1 cm. There is no significant extension of the fracture into the femoral neck or femoral head. The femoral head is located. No evidence of dislocation of the visualized right hemipelvis. Mild underlying right hip degenerative changes with a small right hip joint effusion. Ligaments Suboptimally assessed by CT. Muscles and Tendons Mild atrophy of the gluteus musculature. No evident tendon tear. Soft tissues No focal periarticular hematoma. There is mild edema within the subcutaneous fat anterolateral to the right hip. Iliofemoral atherosclerosis and mild sigmoid diverticulosis are noted. IMPRESSION: Comminuted intertrochanteric right femur fracture as described. Underlying mild right hip degenerative changes. Electronically Signed   By: Carey Bullocks M.D.   On: 11/20/2019 08:52   DG HIP OPERATIVE UNILAT W OR W/O PELVIS RIGHT  Result Date: 11/20/2019 CLINICAL DATA:  Fracture fixation. EXAM: OPERATIVE left HIP (WITH PELVIS IF PERFORMED) 4 VIEWS TECHNIQUE: Fluoroscopic spot image(s) were submitted for  interpretation post-operatively. COMPARISON:  Radiographs 11/20/2019 FINDINGS: Fluoroscopic spot images demonstrate fixation of the intertrochanteric fracture with a intramedullary gamma nail and a fixating proximal dynamic hip screw. Good position and alignment without complicating features. IMPRESSION: Internal fixation of intertrochanteric fracture. Electronically Signed   By: Marijo Sanes M.D.   On: 11/20/2019 15:38   DG Hip Unilat W or Wo Pelvis 2-3 Views Right  Result Date: 11/20/2019 CLINICAL DATA:  Fall today. Right hip pain and swelling. EXAM: DG HIP (WITH OR WITHOUT PELVIS) 2-3V RIGHT COMPARISON:  None. FINDINGS: The bones appear mildly demineralized. There is a comminuted and mildly displaced intertrochanteric right femur  fracture. No dislocation or pelvic fracture identified. There are minimal degenerative changes of both hips and sacroiliac joints. Greater degenerative changes are noted within the lower lumbar spine. IMPRESSION: Comminuted and mildly displaced intertrochanteric right femur fracture. Electronically Signed   By: Richardean Sale M.D.   On: 11/20/2019 08:54     Assessment and Recommendation  86 y.o. male with known paroxysmal nonvalvular atrial fibrillation hypertension having fall and hip fracture status post hip surgery without evidence of congestive heart failure or anginal symptoms 1.  EKG to assess whether patient is in normal rhythm now or atrial fibrillation and would help with decision-making.  Currently would not use any beta-blocker or other heart rate regulating medication due to good control of heart rate 2.  No further cardiac intervention at this time due to no symptoms of angina or congestive heart failure 3.  Increase treatment for rehabilitation of surgery without restriction 4.  Further consideration of anticoagulation if patient remaining in atrial fibrillation by EKG with Eliquis 5 mg twice per day 5.  Okay to proceed to home health and or recovery from surgery with follow-up will for assessment of further treatment and medication management in 1 to 2 weeks  Signed, Serafina Royals M.D. FACC

## 2019-11-22 NOTE — Evaluation (Addendum)
Occupational Therapy Evaluation Patient Details Name: Danny Brady MRN: [id number] DOB: Feb 27, 1927 Today's Date: 11/22/2019    History of Present Illness Pt is 86 yo male s/p IM nailing for R hip fx. 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   Clinical Impression   Danny Brady, "Pop", was seen for OT evaluation this date, POD#2 from above surgery. Pt reports he was independent in all ADLs prior to surgery, however occasionally using SPC for mobility. Pt endorses 2 falls in the past 6 months. He states that with this most recent fall, he was standing to don pants when his pants became twisted and caused him to lose his balance. Pt is eager to return home to his PLOF with less pain and improved safety and independence. Pt currently requires moderate assist for LB dressing and bathing while in seated position due to pain and limited AROM of R hip as well as +2 mod/max assist for bed and functional mobility attempts. Pt instructed in self care skills, falls prevention strategies, & home/routines modifications to support safety and functional independence upon hospital DC. Pt would benefit from additional instruction in self care skills and techniques to help maintain precautions with or without assistive devices to support recall and carryover prior to discharge. Recommend STR upon hospital discharge to maximize pt safety and return to PLOF.       Follow Up Recommendations  SNF    Equipment Recommendations  None recommended by OT(Pt has BSC.)    Recommendations for Other Services       Precautions / Restrictions Precautions Precautions: Fall Restrictions Weight Bearing Restrictions: Yes RLE Weight Bearing: Weight bearing as tolerated      Mobility Bed Mobility Overal bed mobility: Needs Assistance Bed Mobility: Supine to Sit;Sit to Supine     Supine to sit: +2 for physical assistance;Mod assist;HOB  elevated;Max assist Sit to supine: Max assist;+2 for physical assistance   General bed mobility comments: Pt comes to sitting EOB given +2 mod/max assist. He initially requires moderate assist to maintain seated balance, but ultimately is able to sit unassisted for brief periods. Requires LUE support on bed at all times if unassisted.  Transfers Overall transfer level: Needs assistance Equipment used: Rolling walker (2 wheeled) Transfers: Sit to/from Stand Sit to Stand: +2 physical assistance;Max assist         General transfer comment: Pt unable to stand without +2 max assist this date. He performs STS x3 with each attempt having episode of bladder incontinence, which limits functional mobility attempts this date. Pt fatigues quickly with mobility attempts    Balance Overall balance assessment: Needs assistance Sitting-balance support: Feet supported;Single extremity supported;Bilateral upper extremity supported Sitting balance-Leahy Scale: Poor Sitting balance - Comments: L lateral lean while seated. Requires at least 1 UE support on bed to maintain sitting balance. Postural control: Left lateral lean   Standing balance-Leahy Scale: Zero                             ADL either performed or assessed with clinical judgement   ADL Overall ADL's : Needs assistance/impaired                                       General ADL Comments: Pt is significantly limited by pain and  decreased AROM of his RLE as well as poor activity tolerance and impaired functional use of his RUE. Requires +2 (mod/max) assist for bed mobility. Can maintain seated position EOB with close supervision to CGA, but will require moderate to max assist for LB ADL management in seated position. +2 max assist for STS attempts this date, pt limited by urinary incontenence and limited AROM of his RLE.     Vision Baseline Vision/History: Wears glasses Wears Glasses: At all times Patient Visual  Report: No change from baseline       Perception     Praxis      Pertinent Vitals/Pain Faces Pain Scale: Hurts worst Pain Location: R hip-with mobility Pain Descriptors / Indicators: Grimacing;Guarding;Sore;Moaning;Aching Pain Intervention(s): Limited activity within patient's tolerance;Monitored during session;Premedicated before session;Repositioned     Hand Dominance Right   Extremity/Trunk Assessment Upper Extremity Assessment Upper Extremity Assessment: RUE deficits/detail;LUE deficits/detail RUE Deficits / Details: Unable to attain shoulder flexion above ~45. Can self range to ~90, but pain limited. Pt endorses hx of "rheumatism" in his R hand and wrist. RUE Coordination: decreased gross motor;decreased fine motor LUE Deficits / Details: shoulder flexion decreased but WFLs. Strength WFLs   Lower Extremity Assessment Lower Extremity Assessment: Generalized weakness;Defer to PT evaluation;RLE deficits/detail RLE Deficits / Details: s/p R IM nail       Communication Communication Communication: No difficulties   Cognition Arousal/Alertness: Awake/alert Behavior During Therapy: WFL for tasks assessed/performed Overall Cognitive Status: Within Functional Limits for tasks assessed                                     General Comments       Exercises Other Exercises Other Exercises: Pt educated on falls prevention strategies, safe use of AE for functional transfer attempts, safe transfer technique, and energy conservation strategies t/o session. would benefit from review/opportunity to trial AE for LB ADL management. Other Exercises: OT/PT engage pt in bed mobility and STS x3 this date. Pt requires +2 mod-max assist for all functional mobility/attempts.   Shoulder Instructions      Home Living Family/patient expects to be discharged to:: Private residence Living Arrangements: Alone Available Help at Discharge: Available PRN/intermittently;Family Type of  Home: House Home Access: Level entry     Home Layout: One level     Bathroom Shower/Tub: Walk-in shower;Door   Bathroom Toilet: Handicapped height     Home Equipment: Environmental consultant - 2 wheels;Walker - 4 wheels;Cane - single point;Shower seat - built in;Shower seat;Bedside commode;Grab bars - tub/shower;Grab bars - toilet;Hand held shower head          Prior Functioning/Environment Level of Independence: Independent with assistive device(s)                 OT Problem List: Decreased strength;Decreased coordination;Cardiopulmonary status limiting activity;Pain;Decreased range of motion;Decreased activity tolerance;Decreased safety awareness;Impaired balance (sitting and/or standing);Decreased knowledge of use of DME or AE;Impaired UE functional use      OT Treatment/Interventions: Self-care/ADL training;Therapeutic exercise;Therapeutic activities;Energy conservation;DME and/or AE instruction;Patient/family education;Balance training    OT Goals(Current goals can be found in the care plan section) Acute Rehab OT Goals Patient Stated Goal: Patient wants to go home OT Goal Formulation: With patient Time For Goal Achievement: 12/06/19 Potential to Achieve Goals: Good ADL Goals Pt Will Perform Grooming: sitting;with set-up;with supervision Pt Will Perform Lower Body Dressing: sit to/from stand;with min assist;with adaptive equipment(With LRAD PRN for improved  safety and functional independence.) Pt Will Transfer to Toilet: bedside commode;ambulating;with min assist;stand pivot transfer(With LRAD PRN for improved safety and functional independence.) Pt Will Perform Toileting - Clothing Manipulation and hygiene: sit to/from stand;with adaptive equipment;with min assist(With LRAD PRN for improved safety and functional independence.) Additional ADL Goal #1: Pt will independently verbalize a plan to implement at least 3 learned falls prevention strategies into his daily routines/home  environment for improved safety and functional independence upon hospital DC.  OT Frequency: Min 2X/week   Barriers to D/C: Inaccessible home environment;Decreased caregiver support          Co-evaluation              AM-PAC OT "6 Clicks" Daily Activity     Outcome Measure Help from another person eating meals?: None Help from another person taking care of personal grooming?: A Little Help from another person toileting, which includes using toliet, bedpan, or urinal?: Total Help from another person bathing (including washing, rinsing, drying)?: A Lot Help from another person to put on and taking off regular upper body clothing?: A Little Help from another person to put on and taking off regular lower body clothing?: A Lot 6 Click Score: 15   End of Session Equipment Utilized During Treatment: Gait belt;Rolling walker Nurse Communication: Other (comment)(MD instructed this author to turn bed alarm off. Pt left with MD in room, no bed alarm.)  Activity Tolerance: Patient tolerated treatment well Patient left: in bed;with call bell/phone within reach;Other (comment)(With MD in room; MD Danford instructs this author to "turn the bed alarm off and make sure it doesn't go off again". RN notified pt left without bed alarm activated at MD request.)  OT Visit Diagnosis: Other abnormalities of gait and mobility (R26.89);Pain;History of falling (Z91.81) Pain - Right/Left: Right Pain - part of body: Hip;Knee                Time: 5643-3295 OT Time Calculation (min): 54 min Charges:  OT General Charges $OT Visit: 1 Visit OT Evaluation $OT Eval Moderate Complexity: 1 Mod OT Treatments $Self Care/Home Management : 23-37 mins  Rockney Ghee, M.S., OTR/L Ascom: [phone] 11/22/19, 12:11 PM

## 2019-11-22 NOTE — TOC Initial Note (Addendum)
Transition of Care [facility]) - Initial/Assessment Note    Patient Details  Name: Danny Brady MRN: [id number] Date of Birth: [birth year]  [facility] CM/SW Contact:    Su Hilt, RN Phone Number: 11/22/2019, 12:06 PM  Clinical Narrative:                 Met with the patient to discuss DC plan and needs  He has all the DME he needs at home Including RW, Shower seat, cane, handicapped toilet, He has 3 adult children that will take turns staying with him 24/7, I explained the recommendation is for SNF and he refuses He is set [facility] with [facility]. No additional needs  Expected Discharge Plan: [place] Barriers to Discharge: Continued Medical Work up   Patient Goals and CMS Choice Patient states their goals for this hospitalization and ongoing recovery are:: go home with family taking turns staying with him      Expected Discharge Plan and Services Expected Discharge Plan: [place]   Discharge Planning Services: CM Consult   Living arrangements for the past 2 months: Single Family Home                 DME Arranged: N/A         HH Arranged: PT HH Agency: [facility] Date [place]: 11/22/19 Time HH Agency Contacted: 1205 Representative spoke with at [place]  Prior Living Arrangements/Services Living arrangements for the past 2 months: [place] with:: Self(3 adult kids will take turns staying with him 24/7) Patient language and need for interpreter reviewed:: Yes Do you feel safe going back to the place where you live?: Yes      Need for Family Participation in Patient Care: No (Comment) Care giver support system in place?: Yes (comment) Current home services: DME(RW, Shower seat, Handicapped toilet, cane) Criminal Activity/Legal Involvement Pertinent to Current Situation/Hospitalization: No - Comment as needed  Activities of Daily  Living Home Assistive Devices/Equipment: Raised toilet seat with rails, Shower chair with back, Walker (specify type), Cane (specify quad or straight) ADL Screening (condition at time of admission) Patient's cognitive ability adequate to safely complete daily activities?: Yes Is the patient deaf or have difficulty hearing?: No Does the patient have difficulty seeing, even when wearing glasses/contacts?: No Does the patient have difficulty concentrating, remembering, or making decisions?: No Patient able to express need for assistance with ADLs?: Yes Does the patient have difficulty dressing or bathing?: No Independently performs ADLs?: Yes (appropriate for developmental age) Does the patient have difficulty walking or climbing stairs?: Yes Weakness of Legs: Right Weakness of Arms/Hands: None  Permission Sought/Granted   Permission granted to share information with : Yes, Verbal Permission Granted              Emotional Assessment Appearance:: Appears stated age Attitude/Demeanor/Rapport: Engaged Affect (typically observed): Appropriate Orientation: : Oriented to Self, Oriented to Place, Oriented to  Time, Oriented to Situation Alcohol / Substance Use: Not Applicable Psych Involvement: No (comment)  Admission diagnosis:  Surgery, elective [Z41.9] Closed right hip fracture ([place]) [S72.001A] Closed fracture of right hip, initial encounter ([place]) [S72.001A] Patient Active Problem List   Diagnosis Date Noted  . Closed right hip fracture ([place]) 11/20/2019  . Hypertension   . Chronic systolic CHF (congestive heart failure) ([place])   . GERD (gastroesophageal reflux disease)   . Fall   .  Closed comminuted fracture of right hip ([place])   . Hypokalemia   . PAF (paroxysmal atrial fibrillation) ([place])    PCP:  Rusty Aus, MD Pharmacy:   [place], [place] - [address] [address] Phone: [phone] Fax: [phone]     Social  Determinants of Health (SDOH) Interventions    Readmission Risk Interventions No flowsheet data found.

## 2019-11-22 NOTE — TOC Progression Note (Signed)
Transition of Care [facility]) - Progression Note    Patient Details  Name: Danny Brady MRN: [id number] Date of Birth: [birth year]  [facility] CM/SW [place], RN Phone Number: 11/22/2019, 1:58 PM  Clinical N+arrative:     Met with the patient again to see if he has changed his mond about going to rehab, He said that he will have two people with him at all times and he wants to go home, he does not want to go to Rehab and he will discuss it again with his sons today and let the CM know this weekend if he changes his mind.  I reminded him it was taking 2+ to move with PT.  He said he knows and his sons were strong as the dickens and could manage.  I encouraged him to at least think about it and provided the facilities making a bed offer  Expected Discharge Plan: [place] Barriers to Discharge: Continued Medical Work up  Expected Discharge Plan and Services Expected Discharge Plan: [place]   Discharge Planning Services: CM Consult   Living arrangements for the past 2 months: Single Family Home                 DME Arranged: N/A         HH Arranged: PT HH [facility]: [facility] Date [place]: 11/22/19 Time [place]: 1205 Representative spoke with at [place]: [place] ([place]) Interventions    Readmission Risk Interventions No flowsheet data found.

## 2019-11-22 NOTE — Progress Notes (Signed)
Physical Therapy Treatment Patient Details Name: Danny Brady MRN: [id number] DOB: 09/12/1927 Today's Date: 11/22/2019    History of Present Illness Pt is 86 yo male s/p IM nailing for R hip fx. 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    PT Comments    Patient alert, reported significant pain with any movement, exhibited significant pain signs/symptoms with all R leg movements. Several exercises performed, AAROM for most to complete, improved muscle activation noted with SAQ. Supine to sit with modAx2 for safety this session, assistance needed for RLE advancement and maxA to scoot towards EOB. Sit <> stand performed 3 trials, pt with bladder incontinence ea attempt. Unable to clear either foot from ground to transfer to chair. Fatigues quickly, SOB noted. More difficulty noted with buttock clearance this session. Pt continues to be motivated but progress is limited due to pain and weakness. Current recommendation remains appropriate. PT and RN discussed potential transfer to chair with lift this AM for OOB.    Follow Up Recommendations  SNF     Equipment Recommendations  Other (comment)(TBD)    Recommendations for Other Services OT consult     Precautions / Restrictions Precautions Precautions: Fall Restrictions Weight Bearing Restrictions: Yes RLE Weight Bearing: Weight bearing as tolerated    Mobility  Bed Mobility Overal bed mobility: Needs Assistance Bed Mobility: Supine to Sit     Supine to sit: +2 for physical assistance;Mod assist;HOB elevated;Max assist Sit to supine: Max assist;+2 for physical assistance   General bed mobility comments: Pt comes to sitting EOB given +2 mod/max assist. He initially requires moderate assist to maintain seated balance, but ultimately is able to sit unassisted for brief periods. Requires LUE support on bed at all times if unassisted.  Transfers Overall  transfer level: Needs assistance Equipment used: Rolling walker (2 wheeled) Transfers: Sit to/from Stand Sit to Stand: +2 physical assistance;Max assist         General transfer comment: performed 3 trial, pt with bladder incontinence ea attempt. Unable to clear either foot from ground to transfer to chair. Fatigues quickly, SOB noted.  Ambulation/Gait                 Stairs             Wheelchair Mobility    Modified Rankin (Stroke Patients Only)       Balance Overall balance assessment: Needs assistance Sitting-balance support: Feet supported;Single extremity supported;Bilateral upper extremity supported Sitting balance-Leahy Scale: Poor Sitting balance - Comments: L lateral lean while seated. Requires at least 1 UE support on bed to maintain sitting balance. Postural control: Left lateral lean   Standing balance-Leahy Scale: Zero                              Cognition Arousal/Alertness: Awake/alert Behavior During Therapy: WFL for tasks assessed/performed Overall Cognitive Status: Within Functional Limits for tasks assessed                                        Exercises General Exercises - Lower Extremity Ankle Circles/Pumps: AROM;Both;10 reps Short Arc Quad: AAROM;Strengthening;Right;10 reps;AROM Heel Slides: AAROM;Strengthening;Right;10 reps;AROM;Left Hip ABduction/ADduction: AAROM;Strengthening;Right;10 reps;AROM;Left Other Exercises Other Exercises: Pt educated on falls prevention strategies, safe use of AE for functional transfer attempts, safe  transfer technique, and energy conservation strategies t/o session. would benefit from review/opportunity to trial AE for LB ADL management. Other Exercises: OT/PT engage pt in bed mobility and STS x3 this date. Pt requires +2 mod-max assist for all functional mobility/attempts.    General Comments        Pertinent Vitals/Pain Pain Assessment: Faces Faces Pain Scale:  Hurts worst Pain Location: R hip-with mobility Pain Descriptors / Indicators: Grimacing;Guarding;Sore;Moaning;Aching Pain Intervention(s): Limited activity within patient's tolerance;Monitored during session;Repositioned;Premedicated before session    Home Living Family/patient expects to be discharged to:: Private residence Living Arrangements: Alone Available Help at Discharge: Available PRN/intermittently;Family Type of Home: House Home Access: Level entry   Home Layout: One level Home Equipment: [place] - 2 wheels;Walker - 4 wheels;Cane - single point;Shower seat - built in;Shower seat;Bedside commode;Grab bars - tub/shower;Grab bars - toilet;Hand held shower head      Prior Function Level of Independence: Independent with assistive device(s)          PT Goals (current goals can now be found in the care plan section) Acute Rehab PT Goals Patient Stated Goal: Patient wants to go home Progress towards PT goals: (slwoly-limited by pain)    Frequency    BID      PT Plan Current plan remains appropriate    Co-evaluation PT/OT/SLP Co-Evaluation/Treatment: Yes Reason for Co-Treatment: Complexity of the patient's impairments (multi-system involvement);Necessary to address cognition/behavior during functional activity;For patient/therapist safety;To address functional/ADL transfers PT goals addressed during session: Mobility/safety with mobility;Balance;Proper use of DME;Strengthening/ROM OT goals addressed during session: ADL's and self-care;Proper use of Adaptive equipment and DME      AM-PAC PT "6 Clicks" Mobility   Outcome Measure  Help needed turning from your back to your side while in a flat bed without using bedrails?: A Lot Help needed moving from lying on your back to sitting on the side of a flat bed without using bedrails?: Total Help needed moving to and from a bed to a chair (including a wheelchair)?: Total Help needed standing up from a chair using your arms  (e.g., wheelchair or bedside chair)?: Total Help needed to walk in hospital room?: Total Help needed climbing 3-5 steps with a railing? : Total 6 Click Score: 7    End of Session Equipment Utilized During Treatment: Gait belt;Oxygen(2L) Activity Tolerance: Patient limited by pain;Patient limited by fatigue Patient left: in bed;with call bell/phone within reach;with bed alarm set;Other (comment)(OT at bedside) Nurse Communication: Mobility status PT Visit Diagnosis: Other abnormalities of gait and mobility (R26.89);Muscle weakness (generalized) (M62.81);Difficulty in walking, not elsewhere classified (R26.2);Pain Pain - Right/Left: Right Pain - part of body: Hip     Time: 1610-9604 PT Time Calculation (min) (ACUTE ONLY): 40 min  Charges:  $Therapeutic Exercise: 23-37 mins                     Lieutenant Diego PT, DPT 12:38 PM,11/22/19

## 2019-11-22 NOTE — Progress Notes (Signed)
Physical Therapy Treatment Patient Details Name: Danny Brady MRN: [id number] DOB: [birth year] Today's Date: 11/22/2019    History of Present Illness Pt is 86 yo male s/p IM nailing for R hip fx. 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    PT Comments    Patient easily woken. Exhibited significant pain signs/symptoms with all RLE and mobility this session. supine to sit maxAx2, significant time needed and maxAx2 to achieve midline in sitting, progressed to BLE UE and LE support sitting balance with CGA-minA. Sit<> Stand maxAx2 and RW. The patient took one step forward on LLE, unable to maintain balance without maxAx2, physical assist to slide RLE towards recliner, slowly lowered into chair. CNA in room at end of session to address pt incontinence (condom cath came off during standing). The patient would benefit from further skilled PT intervention to maximize mobility ,safety, and independence.      Follow Up Recommendations  SNF     Equipment Recommendations  Other (comment)(TBD)    Recommendations for Other Services OT consult     Precautions / Restrictions Precautions Precautions: Fall Restrictions Weight Bearing Restrictions: Yes RLE Weight Bearing: Weight bearing as tolerated    Mobility  Bed Mobility Overal bed mobility: Needs Assistance Bed Mobility: Supine to Sit     Supine to sit: +2 for physical assistance;Max assist Sit to supine: Max assist;+2 for physical assistance   General bed mobility comments: maxAx2 to come to sitting, maxA to maintain sitting, progressed to CGA-minA with positioning to midline and bilateral LE support  Transfers Overall transfer level: Needs assistance Equipment used: Rolling walker (2 wheeled) Transfers: Sit to/from Stand Sit to Stand: +2 physical assistance;Max assist         General transfer comment: performed 3 trial, pt with bladder incontinence  ea attempt. Unable to clear either foot from ground to transfer to chair. Fatigues quickly, SOB noted.  Ambulation/Gait Ambulation/Gait assistance: Max assist;+2 physical assistance Gait Distance (Feet): 1 Feet         General Gait Details: Pt took one step forward on LLE, physical assist to slide RLE on floor to sit safely in recliner at bedside.   Stairs             Wheelchair Mobility    Modified Rankin (Stroke Patients Only)       Balance Overall balance assessment: Needs assistance Sitting-balance support: Feet supported;Single extremity supported;Bilateral upper extremity supported Sitting balance-Leahy Scale: Poor Sitting balance - Comments: L lateral lean while seated. Requires at least 1 UE support on bed to maintain sitting balance. Postural control: Left lateral lean   Standing balance-Leahy Scale: Zero                              Cognition Arousal/Alertness: Awake/alert Behavior During Therapy: WFL for tasks assessed/performed Overall Cognitive Status: Within Functional Limits for tasks assessed                                        Exercises General Exercises - Lower Extremity Ankle Circles/Pumps: AROM;Both;10 reps Short Arc Quad: AAROM;Strengthening;Right;10 reps;AROM Heel Slides: AAROM;Strengthening;Right;AROM;Left;20 reps Hip ABduction/ADduction: AAROM;Strengthening;Right;AROM;Left;20 reps    General Comments        Pertinent Vitals/Pain Pain Assessment: Faces Faces Pain Scale: Hurts worst Pain Location: R  hip-with mobility Pain Descriptors / Indicators: Grimacing;Guarding;Sore;Moaning;Aching Pain Intervention(s): Limited activity within patient's tolerance;Monitored during session;Repositioned;Premedicated before session    Home Living                      Prior Function            PT Goals (current goals can now be found in the care plan section) Progress towards PT goals: Progressing toward  goals    Frequency    BID      PT Plan Current plan remains appropriate    Co-evaluation PT/OT/SLP Co-Evaluation/Treatment: Yes Reason for Co-Treatment: Complexity of the patient's impairments (multi-system involvement);Necessary to address cognition/behavior during functional activity;For patient/therapist safety;To address functional/ADL transfers PT goals addressed during session: Mobility/safety with mobility;Balance;Proper use of DME;Strengthening/ROM OT goals addressed during session: ADL's and self-care;Proper use of Adaptive equipment and DME      AM-PAC PT "6 Clicks" Mobility   Outcome Measure  Help needed turning from your back to your side while in a flat bed without using bedrails?: A Lot Help needed moving from lying on your back to sitting on the side of a flat bed without using bedrails?: Total Help needed moving to and from a bed to a chair (including a wheelchair)?: Total Help needed standing up from a chair using your arms (e.g., wheelchair or bedside chair)?: Total Help needed to walk in hospital room?: Total Help needed climbing 3-5 steps with a railing? : Total 6 Click Score: 7    End of Session Equipment Utilized During Treatment: Gait belt;Oxygen(2L) Activity Tolerance: Patient limited by pain;Patient limited by fatigue Patient left: in chair;with chair alarm set;with nursing/sitter in room Nurse Communication: Mobility status PT Visit Diagnosis: Other abnormalities of gait and mobility (R26.89);Muscle weakness (generalized) (M62.81);Difficulty in walking, not elsewhere classified (R26.2);Pain Pain - Right/Left: Right Pain - part of body: Hip     Time: 1525-1600 PT Time Calculation (min) (ACUTE ONLY): 35 min  Charges:  $Therapeutic Exercise: 23-37 mins                     Olga Coaster PT, DPT 4:27 PM,11/22/19

## 2019-11-23 LAB — CBC
HCT: 20.6 % — ABNORMAL LOW (ref 39.0–52.0)
Hemoglobin: 6.7 g/dL — ABNORMAL LOW (ref 13.0–17.0)
MCH: 31.8 pg (ref 26.0–34.0)
MCHC: 32.5 g/dL (ref 30.0–36.0)
MCV: 97.6 fL (ref 80.0–100.0)
Platelets: 137 10*3/uL — ABNORMAL LOW (ref 150–400)
RBC: 2.11 MIL/uL — ABNORMAL LOW (ref 4.22–5.81)
RDW: 14.6 % (ref 11.5–15.5)
WBC: 8.8 10*3/uL (ref 4.0–10.5)
nRBC: 0.3 % — ABNORMAL HIGH (ref 0.0–0.2)

## 2019-11-23 LAB — BASIC METABOLIC PANEL
Anion gap: 9 (ref 5–15)
BUN: 34 mg/dL — ABNORMAL HIGH (ref 8–23)
CO2: 25 mmol/L (ref 22–32)
Calcium: 8.1 mg/dL — ABNORMAL LOW (ref 8.9–10.3)
Chloride: 101 mmol/L (ref 98–111)
Creatinine, Ser: 1.01 mg/dL (ref 0.61–1.24)
GFR calc Af Amer: >60 mL/min (ref >60)
GFR calc non Af Amer: >60 mL/min (ref >60)
Glucose, Bld: 118 mg/dL — ABNORMAL HIGH (ref 70–99)
Potassium: 4.1 mmol/L (ref 3.5–5.1)
Sodium: 135 mmol/L (ref 135–145)

## 2019-11-23 LAB — SARS CORONAVIRUS 2 (TAT 6-24 HRS): SARS Coronavirus 2: NEGATIVE

## 2019-11-23 LAB — PREPARE RBC (CROSSMATCH)

## 2019-11-23 MED ORDER — SODIUM CHLORIDE 0.9% IV SOLUTION: Freq: Once | INTRAVENOUS | Status: DC

## 2019-11-23 NOTE — Progress Notes (Signed)
Subjective:  POD # 3 s/p intramedullary fixation right intertrochanteric hip fracture.   Patient reports right hip pain as mild.  Dressing was changed yesterday.  Patient with persistent anemia.  Hemoglobin 6.7 and hematocrit 20.6 today.  Objective:   VITALS:   Vitals:   11/23/19 0813 11/23/19 1135 11/23/19 1202 11/23/19 1203  BP: (!) 110/57 (!) 97/53 (!) 117/57 (!) 118/57  Pulse: 65 82 84 80  Resp:  (!) 21 20 20   Temp: 98.1 F (36.7 C) 98.2 F (36.8 C) 98.4 F (36.9 C) 98.4 F (36.9 C)  TempSrc: Oral Oral Oral Oral  SpO2: 99% 100% 99% 100%  Weight:      Height:        PHYSICAL EXAM: Right lower extremity: Patient has swelling of the right hip around the operative site but his thigh compartments are soft and compressible.  There is a small amount of sanguinous drainage on his dressing.  Patient can dorsiflex and plantarflex his ankles.  He has palpable pedal pulses and intact sensation light touch throughout the right lower extremity. Neurovascular intact Sensation intact distally Intact pulses distally Dorsiflexion/Plantar flexion intact Incision: scant drainage No cellulitis present Compartment soft  LABS  Results for orders placed or performed during the hospital encounter of 11/20/19 (from the past 24 hour(s))  CBC     Status: Abnormal   Collection Time: 11/23/19  5:02 AM  Result Value Ref Range   WBC 8.8 4.0 - 10.5 K/uL   RBC 2.11 (L) 4.22 - 5.81 MIL/uL   Hemoglobin 6.7 (L) 13.0 - 17.0 g/dL   HCT 20.6 (L) 39.0 - 52.0 %   MCV 97.6 80.0 - 100.0 fL   MCH 31.8 26.0 - 34.0 pg   MCHC 32.5 30.0 - 36.0 g/dL   RDW 14.6 11.5 - 15.5 %   Platelets 137 (L) 150 - 400 K/uL   nRBC 0.3 (H) 0.0 - 0.2 %  Basic metabolic panel     Status: Abnormal   Collection Time: 11/23/19  5:02 AM  Result Value Ref Range   Sodium 135 135 - 145 mmol/L   Potassium 4.1 3.5 - 5.1 mmol/L   Chloride 101 98 - 111 mmol/L   CO2 25 22 - 32 mmol/L   Glucose, Bld 118 (H) 70 - 99 mg/dL   BUN 34 (H)  8 - 23 mg/dL   Creatinine, Ser 1.01 0.61 - 1.24 mg/dL   Calcium 8.1 (L) 8.9 - 10.3 mg/dL   GFR calc non Af Amer >60 >60 mL/min   GFR calc Af Amer >60 >60 mL/min   Anion gap 9 5 - 15  Prepare RBC     Status: None   Collection Time: 11/23/19  8:00 AM  Result Value Ref Range   Order Confirmation      ORDER PROCESSED BY BLOOD BANK Performed at [facility], [address] [place], [place] [address]     No results found.  Assessment/Plan: 3 Days Post-Op   Principal Problem:   Closed comminuted fracture of right hip (HCC) Active Problems:   Hypertension   Chronic systolic CHF (congestive heart failure) (HCC)   GERD (gastroesophageal reflux disease)   Fall   Hypokalemia   PAF (paroxysmal atrial fibrillation) ([place])   Closed right hip fracture ([place])  Patient with anemia.  No active signs of bleeding around the operative site.  Continue physical therapy as medically appropriate.  Patient ordered for transfusion of PRBCs.  Will follow.    Thornton Park , MD  11/23/2019, 12:10 PM

## 2019-11-23 NOTE — Progress Notes (Addendum)
Physical Therapy Treatment Patient Details Name: Danny Brady MRN: [id number] DOB: 04/23/1927 Today's Date: 11/23/2019    History of Present Illness Pt is 86 yo male s/p IM nailing for R hip fx. 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    PT Comments    Pt was sitting up in bed upon arriivng. He agrees to PT session and c/o 8/10 R hip pain however agreeable to PT session. Pt will receive transfusion later this date with HGB 6.7. Did not attempt OOB activity however pt did perform supine exercises in bed listed below. Pt agreeable to EOB sitting but required max assist of one to transition from supine to/from EOB  sitting. Pt sat EOB x 5 minutes with poor sitting balance with hard L lean 2/2 to guarding form pain in R hip. He was repositioned back supine in bed post session with call bell in reach, bed alarm set, and SCDs reapplied. PT continues to recommend pt d/c to SNF but pt wanting to go home with 24 hour assistance. PT will continue to follow pt per POC progressing as able.     Follow Up Recommendations  SNF     Equipment Recommendations       Recommendations for Other Services       Precautions / Restrictions Precautions Precautions: Fall Restrictions Weight Bearing Restrictions: Yes RLE Weight Bearing: Weight bearing as tolerated    Mobility  Bed Mobility Overal bed mobility: Needs Assistance Bed Mobility: Supine to Sit;Sit to Supine(HOB elevated)     Supine to sit: Max assist;HOB elevated Sit to supine: Max assist   General bed mobility comments: Pt performed supine to EOB short sit with max assist of 1 + increased time. therapist supported RLE and trunk control throughout mobility. Did not progress to standing 2/2 to HGB 6.7 and will have transfusion later this date.  Transfers                    Ambulation/Gait                 Stairs             Wheelchair  Mobility    Modified Rankin (Stroke Patients Only)       Balance Overall balance assessment: Needs assistance Sitting-balance support: No upper extremity supported;Bilateral upper extremity supported Sitting balance-Leahy Scale: Poor Sitting balance - Comments: L lateral lean with sitting EOB requiring min/mod assist to prevent LOB L. sat EOB x 5 minutes  Postural control: Left lateral lean                                  Cognition Arousal/Alertness: Awake/alert Behavior During Therapy: WFL for tasks assessed/performed Overall Cognitive Status: Within Functional Limits for tasks assessed                                 General Comments: Pt was A and O      Exercises General Exercises - Lower Extremity Ankle Circles/Pumps: AROM;Both;20 reps Quad Sets: AROM;Right;10 reps Gluteal Sets: AROM;10 reps Short Arc Quad: AAROM;Strengthening;Right;10 reps Heel Slides: AAROM;Right;10 reps;Supine Hip ABduction/ADduction: AAROM;Strengthening;Right;10 reps    General Comments        Pertinent Vitals/Pain Pain Assessment: 0-10 Pain Score: 8  Faces Pain Scale: Hurts whole  lot Pain Location: R hip-with mobility Pain Descriptors / Indicators: Grimacing;Guarding;Sore;Moaning;Aching Pain Intervention(s): Limited activity within patient's tolerance;Monitored during session;Repositioned    Home Living                      Prior Function            PT Goals (current goals can now be found in the care plan section) Acute Rehab PT Goals Patient Stated Goal: " I feel ok but ready to go home" Progress towards PT goals: Progressing toward goals    Frequency    BID      PT Plan Current plan remains appropriate    Co-evaluation     PT goals addressed during session: Mobility/safety with mobility;Strengthening/ROM;Balance        AM-PAC PT "6 Clicks" Mobility   Outcome Measure  Help needed turning from your back to your side while in a  flat bed without using bedrails?: A Lot Help needed moving from lying on your back to sitting on the side of a flat bed without using bedrails?: A Lot Help needed moving to and from a bed to a chair (including a wheelchair)?: A Lot Help needed standing up from a chair using your arms (e.g., wheelchair or bedside chair)?: Total Help needed to walk in hospital room?: Total Help needed climbing 3-5 steps with a railing? : Total 6 Click Score: 9    End of Session Equipment Utilized During Treatment: Gait belt;Oxygen(pt on 2 l o2 throughout sesison) Activity Tolerance: Patient limited by fatigue;No increased pain Patient left: in bed;with call bell/phone within reach;with bed alarm set;with SCD's reapplied Nurse Communication: Mobility status PT Visit Diagnosis: Other abnormalities of gait and mobility (R26.89);Muscle weakness (generalized) (M62.81);Difficulty in walking, not elsewhere classified (R26.2);Pain Pain - Right/Left: Right Pain - part of body: Hip     Time: 6384-5364 PT Time Calculation (min) (ACUTE ONLY): 25 min  Charges:  $Therapeutic Exercise: 8-22 mins $Therapeutic Activity: 8-22 mins                     Julaine Fusi PTA 11/23/19, 10:31 AM

## 2019-11-23 NOTE — Progress Notes (Signed)
PT Cancellation Note  Patient Details Name: Danny Brady MRN: [id number] DOB: 11-13-1926   Cancelled Treatment:     PT Hold. Pt receiving blood transfusion. Hgb 6.7 hematocrit 20.6 Will follow up tomorrow per POC.      Rushie Chestnut 11/23/2019, 12:42 PM

## 2019-11-23 NOTE — Progress Notes (Signed)
PROGRESS NOTE    Danny Brady  ENI:[id number] DOB: Mar 02, 1927 DOA: 11/20/2019 PCP: Rusty Aus, MD      Brief Narrative:  Danny Brady is a 86 y.o. M with hx Afib not on AC, sCHF EF 45%, HTN who presented with fall and hip pain.  Patient was putting on his pants, when he lost balance and fell, had immediate right hip pain and could not walk.    In the ER, radiograph showed comminuted RIGHT femur fracture.  CXR clear and ECG showed rate controlled Afib.  Dr. Harlow Mares consulted and recommended operative fixation.       Assessment & Plan:  RIGHT hip fracture -Consult Orthopedic surgery -Pt Eval -COVID test prior to discharge, will obtain this evening   Anemia, acute blood loss, suspect post-surgical Hgb 11 on admission (this appears to be baseline from 2019).  Four-point hemoglobin dropped postoperatively.  Transfused 1 unit PRBCs 1/28, hemoglobin still trending down.  No obvious clinical bleeding or melena or hematochezia.  Surgical site looks fine, hip swollen, suspect intrathigh hematoma.  Hemoglobin 6.7 g/dL this morning. -Transfuse 1 unit -Hold Eliquis -Trend hemoglobin    Atrial fibrillation, persistent CHA2DS2-Vasc 4.   -Hold Eliquis at present given unstable Hgb but will need to be started as outpatient.  Rate controlled off nodal blocking agents.   Hypertension Chronic systolic CHF BP controlled Appears euvolemic but at risk of CHF with transfusion. -Continue furosemide, spironolactone  Hypokalemia  Recent skin excision -Continue cephalexin   At risk of malnutrition -Continue Ensure  BPH -Continue finasteride       Disposition: The patient was admitted with a right hip fracture.  He underwent an open reduction internal fixation on 1/28, and his had unstable hemoglobin since then.  Discussed with surgery, we suspect that he is got an intrathigh hematoma.  We will continue transfusion, disposition to rehab still uncertain.       MDM: The  below labs and imaging reports reviewed and summarized above.  Medication management as above.        DVT prophylaxis: Aspirin and SCDs for now, will start Eliquis when Hgb stabilizes Code Status: FULL Family Communication: Called again to daughter by phone, no answer left voicemail    Consultants:   Orthopedic Surgery  Cardiology  Procedures:   1/27 ORIF right hip by Dr. Harlow Mares  1/28 blood transfusion x1  1/30 blood transfusion x1  Antimicrobials:   Cephalexin 1.28 >>   Culture data:   None           Subjective: No hematuria, melena, hematochezia.  No redness or bruising of the right thigh.  Objective: Vitals:   11/23/19 1135 11/23/19 1202 11/23/19 1203 11/23/19 1444  BP: (!) 97/53 (!) 117/57 (!) 118/57 118/61  Pulse: 82 84 80 66  Resp: (!) 21 20 20 19   Temp: 98.2 F (36.8 C) 98.4 F (36.9 C) 98.4 F (36.9 C) 98.4 F (36.9 C)  TempSrc: Oral Oral Oral Oral  SpO2: 100% 99% 100% 99%  Weight:      Height:        Intake/Output Summary (Last 24 hours) at 11/23/2019 1503 Last data filed at 11/23/2019 1444 Gross per 24 hour  Intake 825 ml  Output 500 ml  Net 325 ml   Filed Weights   11/20/19 0718 11/20/19 1355  Weight: 87.1 kg 87 kg    Examination: General appearance: Elderly adult male, alert and in no acute distress.   HEENT: Anicteric, conjunctiva pink, lids and lashes  normal. No nasal deformity, discharge, epistaxis.  Lips moist,  OP tacky dry, no oral lesions.   Skin: Warm and dry.  No suspicious rashes or lesions.  No bruising or discharge from his wound incision. Cardiac: RRR, no murmurs appreciated.  No LE edema.    Respiratory: Normal respiratory rate and rhythm.  CTAB without rales or wheezes. Abdomen: Abdomen soft.  No tenderness palpation or ascites. No ascites, distension, hepatosplenomegaly.   MSK: No deformities or effusions of the large joints of the upper or lower extremities bilaterally.  The right thigh appears somewhat  swollen, but not bruised or discolored. Neuro: Awake and alert. Naming is grossly intact, and the patient's recall, recent and remote, as well as general fund of knowledge seem within normal limits.  Muscle tone normal, without fasciculations.  Moves upper extremities equally and with normal coordination.  Lower extremities not tested due to pain.  Speech fluent.    Psych: Sensorium intact and responding to questions, attention normal. Affect normal.  Judgment and insight appear normal.      Data Reviewed: I have personally reviewed following labs and imaging studies:  CBC: Recent Labs  Lab 11/20/19 0800 11/21/19 0803 11/22/19 0356 11/22/19 0818 11/23/19 0502  WBC 5.1 8.7 9.4  --  8.8  NEUTROABS 3.6  --   --   --   --   HGB 11.7* 7.0* 7.1* 7.1* 6.7*  HCT 36.5* 21.8* 21.6* 22.6* 20.6*  MCV 98.4 99.1 96.4  --  97.6  PLT 151 132* 129*  --  137*   Basic Metabolic Panel: Recent Labs  Lab 11/20/19 0800 11/21/19 0803 11/22/19 0356 11/23/19 0502  NA 139 136 135 135  K 3.3* 4.1 4.1 4.1  CL 103 103 104 101  CO2 26 24 26 25   GLUCOSE 124* 151* 132* 118*  BUN 27* 26* 27* 34*  CREATININE 1.07 1.11 0.96 1.01  CALCIUM 8.9 8.0* 8.2* 8.1*  MG  --  1.6* 2.0  --    GFR: Estimated Creatinine Clearance: 52.1 mL/min (by C-G formula based on SCr of 1.01 mg/dL). Liver Function Tests: Recent Labs  Lab 11/20/19 0800  AST 19  ALT 11  ALKPHOS 55  BILITOT 0.9  PROT 7.2  ALBUMIN 4.0   No results for input(s): LIPASE, AMYLASE in the last 168 hours. No results for input(s): AMMONIA in the last 168 hours. Coagulation Profile: Recent Labs  Lab 11/20/19 0800  INR 1.0   Cardiac Enzymes: No results for input(s): CKTOTAL, CKMB, CKMBINDEX, TROPONINI in the last 168 hours. BNP (last 3 results) No results for input(s): PROBNP in the last 8760 hours. HbA1C: No results for input(s): HGBA1C in the last 72 hours. CBG: No results for input(s): GLUCAP in the last 168 hours. Lipid Profile: No  results for input(s): CHOL, HDL, LDLCALC, TRIG, CHOLHDL, LDLDIRECT in the last 72 hours. Thyroid Function Tests: No results for input(s): TSH, T4TOTAL, FREET4, T3FREE, THYROIDAB in the last 72 hours. Anemia Panel: Recent Labs    11/21/19 0803 11/21/19 0804  VITAMINB12  --  173*  FOLATE 6.9  --   FERRITIN 180  --   TIBC 210*  --   IRON 27*  --   RETICCTPCT 2.6  --    Urine analysis: No results found for: COLORURINE, APPEARANCEUR, LABSPEC, PHURINE, GLUCOSEU, HGBUR, BILIRUBINUR, KETONESUR, PROTEINUR, UROBILINOGEN, NITRITE, LEUKOCYTESUR Sepsis Labs: @LABRCNTIP (procalcitonin:4,lacticacidven:4)  ) Recent Results (from the past 240 hour(s))  Respiratory Panel by RT PCR (Flu A&B, Covid) - Nasopharyngeal Swab  Status: None   Collection Time: 11/20/19  7:21 AM   Specimen: Nasopharyngeal Swab  Result Value Ref Range Status   SARS Coronavirus 2 by RT PCR NEGATIVE NEGATIVE Final    Comment: (NOTE) SARS-CoV-2 target nucleic acids are NOT DETECTED. The SARS-CoV-2 RNA is generally detectable in upper respiratoy specimens during the acute phase of infection. The lowest concentration of SARS-CoV-2 viral copies this assay can detect is 131 copies/mL. A negative result does not preclude SARS-Cov-2 infection and should not be used as the sole basis for treatment or other patient management decisions. A negative result may occur with  improper specimen collection/handling, submission of specimen other than nasopharyngeal swab, presence of viral mutation(s) within the areas targeted by this assay, and inadequate number of viral copies (<131 copies/mL). A negative result must be combined with clinical observations, patient history, and epidemiological information. The expected result is Negative. Fact Sheet for Patients:  https://www.moore.com/ Fact Sheet for Healthcare Providers:  https://www.young.biz/ This test is not yet ap proved or cleared by the  [place] FDA and  has been authorized for detection and/or diagnosis of SARS-CoV-2 by FDA under an Emergency Use Authorization (EUA). This EUA will remain  in effect (meaning this test can be used) for the duration of the COVID-19 declaration under Section 564(b)(1) of the Act, 21 U.S.C. section 360bbb-3(b)(1), unless the authorization is terminated or revoked sooner.    Influenza A by PCR NEGATIVE NEGATIVE Final   Influenza B by PCR NEGATIVE NEGATIVE Final    Comment: (NOTE) The Xpert Xpress SARS-CoV-2/FLU/RSV assay is intended as an aid in  the diagnosis of influenza from Nasopharyngeal swab specimens and  should not be used as a sole basis for treatment. Nasal washings and  aspirates are unacceptable for Xpert Xpress SARS-CoV-2/FLU/RSV  testing. Fact Sheet for Patients: https://www.moore.com/ Fact Sheet for Healthcare Providers: https://www.young.biz/ This test is not yet approved or cleared by the [place] FDA and  has been authorized for detection and/or diagnosis of SARS-CoV-2 by  FDA under an Emergency Use Authorization (EUA). This EUA will remain  in effect (meaning this test can be used) for the duration of the  Covid-19 declaration under Section 564(b)(1) of the Act, 21  U.S.C. section 360bbb-3(b)(1), unless the authorization is  terminated or revoked. Performed at [facility], [address], [place], [place] [address]          Radiology Studies: No results found.      Scheduled Meds: . sodium chloride   Intravenous Once  . sodium chloride   Intravenous Once  . aspirin EC  81 mg Oral Daily  . cephALEXin  500 mg Oral BID  . docusate sodium  100 mg Oral BID  . feeding supplement (ENSURE ENLIVE)  237 mL Oral BID BM  . finasteride  5 mg Oral Daily  . furosemide  40 mg Oral Daily  . multivitamin with minerals  1 tablet Oral Daily  . nutrition supplement (JUVEN)  1 packet Oral BID BM  . pantoprazole   40 mg Oral Daily  . spironolactone  12.5 mg Oral Daily  . tranexamic acid (CYKLOKAPRON) topical -INTRAOP  2,000 mg Topical Once   Continuous Infusions:   LOS: 3 days    Time spent: 25 minutes    Alberteen Sam, MD [facility] 11/23/2019, 3:03 PM     Please page though AMION or Epic secure chat:  For [employer], Higher education careers adviser

## 2019-11-24 LAB — BASIC METABOLIC PANEL
Anion gap: 7 (ref 5–15)
BUN: 33 mg/dL — ABNORMAL HIGH (ref 8–23)
CO2: 29 mmol/L (ref 22–32)
Calcium: 8 mg/dL — ABNORMAL LOW (ref 8.9–10.3)
Chloride: 99 mmol/L (ref 98–111)
Creatinine, Ser: 0.98 mg/dL (ref 0.61–1.24)
GFR calc Af Amer: >60 mL/min (ref >60)
GFR calc non Af Amer: >60 mL/min (ref >60)
Glucose, Bld: 101 mg/dL — ABNORMAL HIGH (ref 70–99)
Potassium: 3.6 mmol/L (ref 3.5–5.1)
Sodium: 135 mmol/L (ref 135–145)

## 2019-11-24 LAB — TYPE AND SCREEN
ABO/RH(D): A POS
Antibody Screen: NEGATIVE
Unit division: 0
Unit division: 0

## 2019-11-24 LAB — BPAM RBC
Blood Product Expiration Date: 202102222359
Blood Product Expiration Date: 202102232359
ISSUE DATE / TIME: 202101281803
ISSUE DATE / TIME: 202101301139
Unit Type and Rh: 6200
Unit Type and Rh: 6200

## 2019-11-24 LAB — CBC
HCT: 22.6 % — ABNORMAL LOW (ref 39.0–52.0)
Hemoglobin: 7.2 g/dL — ABNORMAL LOW (ref 13.0–17.0)
MCH: 30.4 pg (ref 26.0–34.0)
MCHC: 31.9 g/dL (ref 30.0–36.0)
MCV: 95.4 fL (ref 80.0–100.0)
Platelets: 156 10*3/uL (ref 150–400)
RBC: 2.37 MIL/uL — ABNORMAL LOW (ref 4.22–5.81)
RDW: 16.7 % — ABNORMAL HIGH (ref 11.5–15.5)
WBC: 7.1 10*3/uL (ref 4.0–10.5)
nRBC: 0.3 % — ABNORMAL HIGH (ref 0.0–0.2)

## 2019-11-24 MED ORDER — BISACODYL 10 MG RE SUPP
10.0000 mg | Freq: Once | RECTAL | Status: AC
  Administered 2019-11-24: 10 mg via RECTAL
  Filled 2019-11-24: qty 1

## 2019-11-24 MED ORDER — FERROUS SULFATE 325 (65 FE) MG PO TABS
325.0000 mg | ORAL_TABLET | Freq: Every day | ORAL | Status: DC
  Administered 2019-11-24 – 2019-11-25 (×2): 325 mg via ORAL
  Filled 2019-11-24 (×2): qty 1

## 2019-11-24 NOTE — Progress Notes (Signed)
  Subjective:  POD #4 s/p intramedullary fixation of right intertrochanteric hip fracture.   Patient reports right hip pain as mild.  Patient up out of bed to chair.  Family members at the bedside.  Objective:   VITALS:   Vitals:   11/23/19 1444 11/23/19 1557 11/24/19 0000 11/24/19 0837  BP: 118/61 (!) 125/52 115/65 112/71  Pulse: 66 62 73 74  Resp: 19  18 18   Temp: 98.4 F (36.9 C) 99.1 F (37.3 C) 98.6 F (37 C) 97.9 F (36.6 C)  TempSrc: Oral Oral Oral   SpO2: 99% 99% 100% 98%  Weight:      Height:        PHYSICAL EXAM: Right lower extremity: Neurovascular intact Sensation intact distally Intact pulses distally Dorsiflexion/Plantar flexion intact Aquacel dressing: moderate drainage No cellulitis present Compartment soft  LABS  Results for orders placed or performed during the hospital encounter of 11/20/19 (from the past 24 hour(s))  CBC     Status: Abnormal   Collection Time: 11/24/19  5:09 AM  Result Value Ref Range   WBC 7.1 4.0 - 10.5 K/uL   RBC 2.37 (L) 4.22 - 5.81 MIL/uL   Hemoglobin 7.2 (L) 13.0 - 17.0 g/dL   HCT 11/26/19 (L) 16.3 - 84.6 %   MCV 95.4 80.0 - 100.0 fL   MCH 30.4 26.0 - 34.0 pg   MCHC 31.9 30.0 - 36.0 g/dL   RDW 65.9 (H) 93.5 - 70.1 %   Platelets 156 150 - 400 K/uL   nRBC 0.3 (H) 0.0 - 0.2 %  Basic metabolic panel     Status: Abnormal   Collection Time: 11/24/19  5:09 AM  Result Value Ref Range   Sodium 135 135 - 145 mmol/L   Potassium 3.6 3.5 - 5.1 mmol/L   Chloride 99 98 - 111 mmol/L   CO2 29 22 - 32 mmol/L   Glucose, Bld 101 (H) 70 - 99 mg/dL   BUN 33 (H) 8 - 23 mg/dL   Creatinine, Ser 11/26/19 0.61 - 1.24 mg/dL   Calcium 8.0 (L) 8.9 - 10.3 mg/dL   GFR calc non Af Amer >60 >60 mL/min   GFR calc Af Amer >60 >60 mL/min   Anion gap 7 5 - 15    No results found.  Assessment/Plan: 4 Days Post-Op   Principal Problem:   Closed comminuted fracture of right hip (HCC) Active Problems:   Hypertension   Chronic systolic CHF (congestive  heart failure) (HCC)   GERD (gastroesophageal reflux disease)   Fall   Hypokalemia   PAF (paroxysmal atrial fibrillation) (HCC)   Closed right hip fracture (HCC)  Patient has improvement in his hemoglobin today after transfusion yesterday.  Patient is stable from an orthopedic standpoint.  Patient is hemodynamically stable.  Continue physical therapy.  Patient is weightbearing as tolerated. Anticoagulation on hold due to patient's anemia.   3.90 , MD 11/24/2019, 3:39 PM

## 2019-11-24 NOTE — Progress Notes (Signed)
Physical Therapy Treatment Patient Details Name: Danny Brady MRN: [id number] DOB: 25-May-1927 Today's Date: 11/24/2019    History of Present Illness Pt is 86 yo male s/p IM nailing for R hip fx. 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    PT Comments    Pre-medicated before session.  HgB remains low 7.2 but no further transfusions planned at this time.  Participated in exercises as described below.  To EOB with mod ax  2.  Initially mod a x 1 for balance but progressed to min guard prior to standing.  Stood with min/mod a x 2 to walker and was able to turn to chair then take 3 steps forward with min/mod a x 1 and close recliner follow.  Fatigues quickly with some buckling/forward lean when stepping. Overall progressing with mobility but still remains significantly limited and appropriate for SNF upon discharge.   Follow Up Recommendations  SNF     Equipment Recommendations       Recommendations for Other Services       Precautions / Restrictions Precautions Precautions: Fall Restrictions Weight Bearing Restrictions: Yes RLE Weight Bearing: Weight bearing as tolerated    Mobility  Bed Mobility Overal bed mobility: Needs Assistance Bed Mobility: Supine to Sit;Sit to Supine(HOB elevated)     Supine to sit: Max assist;HOB elevated;Mod assist;+2 for physical assistance        Transfers Overall transfer level: Needs assistance Equipment used: Rolling walker (2 wheeled) Transfers: Sit to/from Stand Sit to Stand: Mod assist;+2 physical assistance            Ambulation/Gait Ambulation/Gait assistance: Mod assist;Min assist;+2 physical assistance;+2 safety/equipment Gait Distance (Feet): 4 Feet Assistive device: Rolling walker (2 wheeled) Gait Pattern/deviations: Step-to pattern Gait velocity: decreased   General Gait Details: Able to step to recliner then take 3 steps forwared with  close recliner follow   Stairs             Wheelchair Mobility    Modified Rankin (Stroke Patients Only)       Balance Overall balance assessment: Needs assistance Sitting-balance support: No upper extremity supported;Bilateral upper extremity supported Sitting balance-Leahy Scale: Poor Sitting balance - Comments: Initially mod a x 1 but progressed to min gaurd prior to transfer Postural control: Left lateral lean Standing balance support: Bilateral upper extremity supported Standing balance-Leahy Scale: Poor Standing balance comment: heavy reliance on walker                            Cognition Arousal/Alertness: Awake/alert Behavior During Therapy: WFL for tasks assessed/performed Overall Cognitive Status: Within Functional Limits for tasks assessed                                 General Comments: Pt was A and O      Exercises General Exercises - Lower Extremity Ankle Circles/Pumps: AROM;Both;20 reps Short Arc Quad: AAROM;Strengthening;Right;10 reps Heel Slides: AAROM;Right;10 reps;Supine Hip ABduction/ADduction: AAROM;Strengthening;Right;10 reps    General Comments        Pertinent Vitals/Pain Pain Assessment: Faces Faces Pain Scale: Hurts even more Pain Location: R hip-with mobility Pain Descriptors / Indicators: Grimacing;Guarding;Sore;Moaning;Aching Pain Intervention(s): Premedicated before session;Repositioned    Home Living  Prior Function            PT Goals (current goals can now be found in the care plan section) Progress towards PT goals: Progressing toward goals    Frequency    BID      PT Plan Current plan remains appropriate    Co-evaluation              AM-PAC PT "6 Clicks" Mobility   Outcome Measure  Help needed turning from your back to your side while in a flat bed without using bedrails?: A Lot Help needed moving from lying on your back to sitting on the  side of a flat bed without using bedrails?: A Lot Help needed moving to and from a bed to a chair (including a wheelchair)?: A Lot Help needed standing up from a chair using your arms (e.g., wheelchair or bedside chair)?: A Lot Help needed to walk in hospital room?: A Lot Help needed climbing 3-5 steps with a railing? : Total 6 Click Score: 11    End of Session Equipment Utilized During Treatment: Gait belt;Oxygen Activity Tolerance: Patient limited by fatigue Patient left: in chair;with call bell/phone within reach;with chair alarm set Nurse Communication: Mobility status Pain - Right/Left: Right Pain - part of body: Hip     Time: 4287-6811 PT Time Calculation (min) (ACUTE ONLY): 25 min  Charges:  $Gait Training: 8-22 mins $Therapeutic Exercise: 8-22 mins                    Chesley Noon, PTA 11/24/19, 10:37 AM

## 2019-11-24 NOTE — Progress Notes (Signed)
PROGRESS NOTE    Danny Brady  RKY:[id number] DOB: 27-Feb-1927 DOA: 11/20/2019 PCP: Rusty Aus, MD      Brief Narrative:  Mr. Heiberger is a 86 y.o. M with hx Afib not on AC, sCHF EF 45%, HTN who presented with fall and hip pain.  Patient was putting on his pants, when he lost balance and fell, had immediate right hip pain and could not walk.    In the ER, radiograph showed comminuted RIGHT femur fracture.  CXR clear and ECG showed rate controlled Afib.  Dr. Harlow Mares consulted and recommended operative fixation.       Assessment & Plan:  RIGHT hip fracture S/p right intramedullary nailing 1/27 by Dr. Harlow Mares -Consult Orthopedic surgery -Pt Eval   Anemia, acute blood loss, suspect post-surgical Hgb 11 on admission (this appears to be baseline from 2019).    Post-op patient had Hgb drop to 7 g/dL. Transfused 1 unit PRBCs 1/28, hemoglobin still trending down.  No obvious external bleeding, melena or hematochezia.  Transfused 1 unit again 1/30.  Surgical site looks fine, hip swollen, suspect intrathigh hematoma.  Discussed with Dr. Harlow Mares and Dr. Mack Guise, surgery.  Rate of bleed appears very slow probably has stopped at this point.  In this context, imaging would not direct changes to management. If unstable hemodynamics (which at no point has appeared to be the case) we would obtain stat CT angiography of this hip and thigh.  Today Hgb still trending down, 7.2 g/dL. -Hold Eliquis -Trend hemoglobin  -Transfusion threshold 7 g/dL -Start iron/docusate  Atrial fibrillation, persistent CHA2DS2-Vasc 4.   -Hold Eliquis at present given unstable Hgb but will need to be started as outpatient.  Rate controlled off nodal blocking agents.   Hypertension Chronic systolic CHF BP controlled Appears euvolemic but at risk of CHF with transfusion. -Continue furosemide, spironolactone  Hypokalemia Resolved  Recent skin excision -Continue cephalexin   At risk of malnutrition  -Continue Ensure  BPH -Continue finasteride       Disposition: The patient was admitted with a right hip fracture.  Postoperatively, his course has been complicated by likely intrathigh hematoma, and acute blood loss anemia requiring transfusion of 2 units of packed red blood cells.  At present his hemoglobin does not appear to be stabilized yet.  We will repeat hemoglobin tomorrow, if he has clear improvement in his hemoglobin by tomorrow, and requires no further transfusions, potentially to SNF tomorrow, pending bed availability.    MDM: The below labs and imaging reports reviewed and summarized above.  Medication management as above.         DVT prophylaxis: Aspirin and SCDs for now Code Status: FULL Family Communication: Son at the bedside    Consultants:   Orthopedic Surgery  Cardiology  Procedures:   1/27 ORIF right hip by Dr. Harlow Mares  1/28 blood transfusion x1  1/30 blood transfusion x1  Antimicrobials:   Cephalexin 1.28 >>   Culture data:   None           Subjective: No fever, confusion, dyspnea, cough, sputum.  He has had no melena, hematochezia, hematuria, epistaxis, or bleeding from his wound site.  He has had no bruising in the right side, although this is somewhat large.     Objective: Vitals:   11/23/19 1444 11/23/19 1557 11/24/19 0000 11/24/19 0837  BP: 118/61 (!) 125/52 115/65 112/71  Pulse: 66 62 73 74  Resp: 19  18 18   Temp: 98.4 F (36.9 C) 99.1 F (37.3  C) 98.6 F (37 C) 97.9 F (36.6 C)  TempSrc: Oral Oral Oral   SpO2: 99% 99% 100% 98%  Weight:      Height:        Intake/Output Summary (Last 24 hours) at 11/24/2019 1526 Last data filed at 11/24/2019 1300 Gross per 24 hour  Intake 720 ml  Output 1300 ml  Net -580 ml   Filed Weights   11/20/19 0718 11/20/19 1355  Weight: 87.1 kg 87 kg    Examination: General appearance: Elderly adult male, alert and in no acute distress.  Lying in bed, later sitting in  recliner. HEENT: Anicteric, conjunctiva pink, lids and lashes normal. No nasal deformity, discharge, epistaxis.  Lips moist, teeth normal. OP moist, no oral lesions.  Hearing slightly diminished. Skin: Warm and dry.  No suspicious rashes or lesions.  Numerous seborrheic keratoses.  Chronic venous stasis change in the bilateral lower extremities. Cardiac: RRR, no murmurs appreciated.  Mild right-sided LE edema due to known intrathigh hematoma in the right.    Respiratory: Normal respiratory rate and rhythm.  CTAB without rales or wheezes. Abdomen: Abdomen soft.  No tenderness palpation or guarding. No ascites, distension, hepatosplenomegaly.   MSK: The right leg is externally rotated.  The right thigh is somewhat swollen, although not bruised. Neuro: Awake and alert. Naming is grossly intact, and the patient's recall, recent and remote, as well as general fund of knowledge seem within normal limits.  Muscle tone normal, without fasciculations.  Moves upper extremities equally and with normal coordination.  Lower extremity testing not done due to pain speech fluent.    Psych: Sensorium intact and responding to questions, attention normal. Affect normal.  Judgment and insight appear normal.        Data Reviewed: I have personally reviewed following labs and imaging studies:  CBC: Recent Labs  Lab 11/20/19 0800 11/20/19 0800 11/21/19 0803 11/22/19 0356 11/22/19 0818 11/23/19 0502 11/24/19 0509  WBC 5.1  --  8.7 9.4  --  8.8 7.1  NEUTROABS 3.6  --   --   --   --   --   --   HGB 11.7*   < > 7.0* 7.1* 7.1* 6.7* 7.2*  HCT 36.5*   < > 21.8* 21.6* 22.6* 20.6* 22.6*  MCV 98.4  --  99.1 96.4  --  97.6 95.4  PLT 151  --  132* 129*  --  137* 156   < > = values in this interval not displayed.   Basic Metabolic Panel: Recent Labs  Lab 11/20/19 0800 11/21/19 0803 11/22/19 0356 11/23/19 0502 11/24/19 0509  NA 139 136 135 135 135  K 3.3* 4.1 4.1 4.1 3.6  CL 103 103 104 101 99  CO2 26 24  26 25 29   GLUCOSE 124* 151* 132* 118* 101*  BUN 27* 26* 27* 34* 33*  CREATININE 1.07 1.11 0.96 1.01 0.98  CALCIUM 8.9 8.0* 8.2* 8.1* 8.0*  MG  --  1.6* 2.0  --   --    GFR: Estimated Creatinine Clearance: 53.7 mL/min (by C-G formula based on SCr of 0.98 mg/dL). Liver Function Tests: Recent Labs  Lab 11/20/19 0800  AST 19  ALT 11  ALKPHOS 55  BILITOT 0.9  PROT 7.2  ALBUMIN 4.0   No results for input(s): LIPASE, AMYLASE in the last 168 hours. No results for input(s): AMMONIA in the last 168 hours. Coagulation Profile: Recent Labs  Lab 11/20/19 0800  INR 1.0   Cardiac Enzymes: No results  for input(s): CKTOTAL, CKMB, CKMBINDEX, TROPONINI in the last 168 hours. BNP (last 3 results) No results for input(s): PROBNP in the last 8760 hours. HbA1C: No results for input(s): HGBA1C in the last 72 hours. CBG: No results for input(s): GLUCAP in the last 168 hours. Lipid Profile: No results for input(s): CHOL, HDL, LDLCALC, TRIG, CHOLHDL, LDLDIRECT in the last 72 hours. Thyroid Function Tests: No results for input(s): TSH, T4TOTAL, FREET4, T3FREE, THYROIDAB in the last 72 hours. Anemia Panel: No results for input(s): VITAMINB12, FOLATE, FERRITIN, TIBC, IRON, RETICCTPCT in the last 72 hours. Urine analysis: No results found for: COLORURINE, APPEARANCEUR, LABSPEC, PHURINE, GLUCOSEU, HGBUR, BILIRUBINUR, KETONESUR, PROTEINUR, UROBILINOGEN, NITRITE, LEUKOCYTESUR Sepsis Labs: @LABRCNTIP (procalcitonin:4,lacticacidven:4)  ) Recent Results (from the past 240 hour(s))  Respiratory Panel by RT PCR (Flu A&B, Covid) - Nasopharyngeal Swab     Status: None   Collection Time: 11/20/19  7:21 AM   Specimen: Nasopharyngeal Swab  Result Value Ref Range Status   SARS Coronavirus 2 by RT PCR NEGATIVE NEGATIVE Final    Comment: (NOTE) SARS-CoV-2 target nucleic acids are NOT DETECTED. The SARS-CoV-2 RNA is generally detectable in upper respiratoy specimens during the acute phase of infection. The  lowest concentration of SARS-CoV-2 viral copies this assay can detect is 131 copies/mL. A negative result does not preclude SARS-Cov-2 infection and should not be used as the sole basis for treatment or other patient management decisions. A negative result may occur with  improper specimen collection/handling, submission of specimen other than nasopharyngeal swab, presence of viral mutation(s) within the areas targeted by this assay, and inadequate number of viral copies (<131 copies/mL). A negative result must be combined with clinical observations, patient history, and epidemiological information. The expected result is Negative. Fact Sheet for Patients:  11/22/19 Fact Sheet for Healthcare Providers:  https://www.moore.com/ This test is not yet ap proved or cleared by the https://www.young.biz/ FDA and  has been authorized for detection and/or diagnosis of SARS-CoV-2 by FDA under an Emergency Use Authorization (EUA). This EUA will remain  in effect (meaning this test can be used) for the duration of the COVID-19 declaration under Section 564(b)(1) of the Act, 21 U.S.C. section 360bbb-3(b)(1), unless the authorization is terminated or revoked sooner.    Influenza A by PCR NEGATIVE NEGATIVE Final   Influenza B by PCR NEGATIVE NEGATIVE Final    Comment: (NOTE) The Xpert Xpress SARS-CoV-2/FLU/RSV assay is intended as an aid in  the diagnosis of influenza from Nasopharyngeal swab specimens and  should not be used as a sole basis for treatment. Nasal washings and  aspirates are unacceptable for Xpert Xpress SARS-CoV-2/FLU/RSV  testing. Fact Sheet for Patients: [place] Fact Sheet for Healthcare Providers: https://www.moore.com/ This test is not yet approved or cleared by the https://www.young.biz/ FDA and  has been authorized for detection and/or diagnosis of SARS-CoV-2 by  FDA under an Emergency  Use Authorization (EUA). This EUA will remain  in effect (meaning this test can be used) for the duration of the  Covid-19 declaration under Section 564(b)(1) of the Act, 21  U.S.C. section 360bbb-3(b)(1), unless the authorization is  terminated or revoked. Performed at [facility], [address] [place], [place]   SARS CORONAVIRUS 2 (TAT 6-24 HRS) Nasopharyngeal Nasopharyngeal Swab     Status: None   Collection Time: 11/23/19 12:10 PM   Specimen: Nasopharyngeal Swab  Result Value Ref Range Status   SARS Coronavirus 2 NEGATIVE NEGATIVE Final    Comment: (NOTE) SARS-CoV-2 target nucleic acids  are NOT DETECTED. The SARS-CoV-2 RNA is generally detectable in upper and lower respiratory specimens during the acute phase of infection. Negative results do not preclude SARS-CoV-2 infection, do not rule out co-infections with other pathogens, and should not be used as the sole basis for treatment or other patient management decisions. Negative results must be combined with clinical observations, patient history, and epidemiological information. The expected result is Negative. Fact Sheet for Patients: HairSlick.no Fact Sheet for Healthcare Providers: quierodirigir.com This test is not yet approved or cleared by the [place] FDA and  has been authorized for detection and/or diagnosis of SARS-CoV-2 by FDA under an Emergency Use Authorization (EUA). This EUA will remain  in effect (meaning this test can be used) for the duration of the COVID-19 declaration under Section 56 4(b)(1) of the Act, 21 U.S.C. section 360bbb-3(b)(1), unless the authorization is terminated or revoked sooner. Performed at [facility], [address] [place], [place] [address]          Radiology Studies: No results found.      Scheduled Meds: . sodium chloride   Intravenous Once  . sodium chloride   Intravenous Once   . aspirin EC  81 mg Oral Daily  . bisacodyl  10 mg Rectal Once  . cephALEXin  500 mg Oral BID  . docusate sodium  100 mg Oral BID  . feeding supplement (ENSURE ENLIVE)  237 mL Oral BID BM  . ferrous sulfate  325 mg Oral Q breakfast  . finasteride  5 mg Oral Daily  . furosemide  40 mg Oral Daily  . multivitamin with minerals  1 tablet Oral Daily  . nutrition supplement (JUVEN)  1 packet Oral BID BM  . pantoprazole  40 mg Oral Daily  . spironolactone  12.5 mg Oral Daily  . tranexamic acid (CYKLOKAPRON) topical -INTRAOP  2,000 mg Topical Once   Continuous Infusions:   LOS: 4 days    Time spent: 25 minutes    Alberteen Sam, MD [facility] 11/24/2019, 3:26 PM     Please page though AMION or Epic secure chat:  For [employer], Higher education careers adviser

## 2019-11-25 DIAGNOSIS — S72091A Other fracture of head and neck of right femur, initial encounter for closed fracture: Secondary | ICD-10-CM

## 2019-11-25 LAB — CBC
HCT: 23.1 % — ABNORMAL LOW (ref 39.0–52.0)
Hemoglobin: 7.3 g/dL — ABNORMAL LOW (ref 13.0–17.0)
MCH: 30.4 pg (ref 26.0–34.0)
MCHC: 31.6 g/dL (ref 30.0–36.0)
MCV: 96.3 fL (ref 80.0–100.0)
Platelets: 196 10*3/uL (ref 150–400)
RBC: 2.4 MIL/uL — ABNORMAL LOW (ref 4.22–5.81)
RDW: 15.9 % — ABNORMAL HIGH (ref 11.5–15.5)
WBC: 7.5 10*3/uL (ref 4.0–10.5)
nRBC: 0.4 % — ABNORMAL HIGH (ref 0.0–0.2)

## 2019-11-25 LAB — BASIC METABOLIC PANEL
Anion gap: 14 (ref 5–15)
BUN: 33 mg/dL — ABNORMAL HIGH (ref 8–23)
CO2: 26 mmol/L (ref 22–32)
Calcium: 8.2 mg/dL — ABNORMAL LOW (ref 8.9–10.3)
Chloride: 97 mmol/L — ABNORMAL LOW (ref 98–111)
Creatinine, Ser: 0.93 mg/dL (ref 0.61–1.24)
GFR calc Af Amer: >60 mL/min (ref >60)
GFR calc non Af Amer: >60 mL/min (ref >60)
Glucose, Bld: 119 mg/dL — ABNORMAL HIGH (ref 70–99)
Potassium: 4.1 mmol/L (ref 3.5–5.1)
Sodium: 137 mmol/L (ref 135–145)

## 2019-11-25 MED ORDER — POLYETHYLENE GLYCOL 3350 17 G PO PACK
34.0000 g | PACK | ORAL | Status: DC
  Administered 2019-11-25 (×2): 34 g via ORAL
  Filled 2019-11-25 (×2): qty 2

## 2019-11-25 MED ORDER — SORBITOL 70 % SOLN
960.0000 mL | TOPICAL_OIL | Freq: Once | ORAL | Status: AC
  Administered 2019-11-25: 960 mL via RECTAL
  Filled 2019-11-25: qty 473

## 2019-11-25 MED ORDER — ACETAMINOPHEN 500 MG PO TABS: 500.0000 mg | ORAL_TABLET | Freq: Four times a day (QID) | ORAL | 0 refills | Status: DC | PRN

## 2019-11-25 MED ORDER — METHOCARBAMOL 500 MG PO TABS: 500.0000 mg | ORAL_TABLET | Freq: Three times a day (TID) | ORAL | Status: DC | PRN

## 2019-11-25 MED ORDER — OXYCODONE-ACETAMINOPHEN 5-325 MG PO TABS: 1.0000 | ORAL_TABLET | Freq: Four times a day (QID) | ORAL | 0 refills | Status: DC | PRN

## 2019-11-25 MED ORDER — FERROUS SULFATE 325 (65 FE) MG PO TABS: 325.0000 mg | ORAL_TABLET | Freq: Every day | ORAL | 3 refills | Status: DC

## 2019-11-25 NOTE — Progress Notes (Signed)
Pt had large soft BM after enema.

## 2019-11-25 NOTE — Progress Notes (Signed)
Handoff report called to [facility] receiving nurse at [facility]. Son in room with pt and is aware of pt discharge to [facility].

## 2019-11-25 NOTE — Progress Notes (Signed)
Handoff report given to EMS. Pt discharged to [facility] via EMS. Belongings sent with pt.

## 2019-11-25 NOTE — Progress Notes (Signed)
  Subjective:  Patient reports pain as mild.    Objective:   VITALS:   Vitals:   11/24/19 0000 11/24/19 0837 11/24/19 2340 11/25/19 0752  BP: 115/65 112/71 112/61 (!) 119/59  Pulse: 73 74 77 73  Resp: 18 18 18    Temp: 98.6 F (37 C) 97.9 F (36.6 C) 98.6 F (37 C) 98.1 F (36.7 C)  TempSrc: Oral   Oral  SpO2: 100% 98% 98% 98%  Weight:      Height:        PHYSICAL EXAM:  Neurologically intact ABD soft Neurovascular intact Sensation intact distally Intact pulses distally Dorsiflexion/Plantar flexion intact Incision: scant drainage No cellulitis present Compartment soft Dressing changed  LABS  Results for orders placed or performed during the hospital encounter of 11/20/19 (from the past 24 hour(s))  CBC     Status: Abnormal   Collection Time: 11/25/19  4:03 AM  Result Value Ref Range   WBC 7.5 4.0 - 10.5 K/uL   RBC 2.40 (L) 4.22 - 5.81 MIL/uL   Hemoglobin 7.3 (L) 13.0 - 17.0 g/dL   HCT 01/23/20 (L) 94.7 - 09.6 %   MCV 96.3 80.0 - 100.0 fL   MCH 30.4 26.0 - 34.0 pg   MCHC 31.6 30.0 - 36.0 g/dL   RDW 28.3 (H) 66.2 - 94.7 %   Platelets 196 150 - 400 K/uL   nRBC 0.4 (H) 0.0 - 0.2 %  Basic metabolic panel     Status: Abnormal   Collection Time: 11/25/19  4:03 AM  Result Value Ref Range   Sodium 137 135 - 145 mmol/L   Potassium 4.1 3.5 - 5.1 mmol/L   Chloride 97 (L) 98 - 111 mmol/L   CO2 26 22 - 32 mmol/L   Glucose, Bld 119 (H) 70 - 99 mg/dL   BUN 33 (H) 8 - 23 mg/dL   Creatinine, Ser 01/23/20 0.61 - 1.24 mg/dL   Calcium 8.2 (L) 8.9 - 10.3 mg/dL   GFR calc non Af Amer >60 >60 mL/min   GFR calc Af Amer >60 >60 mL/min   Anion gap 14 5 - 15    No results found.  Assessment/Plan: 5 Days Post-Op   Principal Problem:   Closed comminuted fracture of right hip (HCC) Active Problems:   Hypertension   Chronic systolic CHF (congestive heart failure) (HCC)   GERD (gastroesophageal reflux disease)   Fall   Hypokalemia   PAF (paroxysmal atrial fibrillation) (HCC)  Closed right hip fracture (HCC)   Advance diet Up with therapy Patient's hemoglobin holding steady today after transfusion Saturday.   Patient is stable from an orthopedic standpoint.   Patient is hemodynamically stable.   Patient is weightbearing as tolerated RLE Anticoagulation on hold due to patient's anemia. Continue pain controlOxycodone Discharge per medicineSNF vs patient prefers home with HHPT if possible Follow up in 2 weeks with DR. Bowers office for staple removal. Call for appt [phone]  [phone] , PA-C 11/25/2019, 8:13 AM

## 2019-11-25 NOTE — TOC Progression Note (Signed)
Transition of Care [facility]) - Progression Note    Patient Details  Name: Danny Brady MRN: [id number] Date of Birth: [birth year]  Transition of Care [facility]) CM/SW Contact  Barrie Dunker, RN Phone Number: 11/25/2019, 9:12 AM  Clinical Narrative:    Discussed bed offers with the patient and he said he wanted to go to [facility], I notified Tina at [facility], He will get Discharged today   Expected Discharge Plan: Home w Home Health Services Barriers to Discharge: Continued Medical Work up  Expected Discharge Plan and Services Expected Discharge Plan: Home w Home Health Services   Discharge Planning Services: CM Consult   Living arrangements for the past 2 months: Single Family Home                 DME Arranged: N/A         HH Arranged: PT HH Agency: [facility] Date HH Agency Contacted: 11/22/19 Time HH Agency Contacted: 1205 Representative spoke with at [facility] Agency: Rosey Bath   Social Determinants of Health (SDOH) Interventions    Readmission Risk Interventions No flowsheet data found.

## 2019-11-25 NOTE — TOC Progression Note (Signed)
Transition of Care [facility]) - Progression Note    Patient Details  Name: EZEL VALLONE MRN: [id number] Date of Birth: May 09, 1927  Transition of Care [facility]) CM/SW Contact  Barrie Dunker, RN Phone Number: 11/25/2019, 9:23 AM  Clinical Narrative:     [facility] to start insurance authorization for the patient to go to [facility], faxed clinical notes to [place] [id number] ref number [id number]  Expected Discharge Plan: Home w Home Health Services Barriers to Discharge: Continued Medical Work up  Expected Discharge Plan and Services Expected Discharge Plan: Home w Home Health Services   Discharge Planning Services: CM Consult   Living arrangements for the past 2 months: Single Family Home                 DME Arranged: N/A         HH Arranged: PT HH Agency: [facility] Date HH Agency Contacted: 11/22/19 Time HH Agency Contacted: 1205 Representative spoke with at [facility] Agency: Rosey Bath   Social Determinants of Health (SDOH) Interventions    Readmission Risk Interventions No flowsheet data found.

## 2019-11-25 NOTE — Progress Notes (Signed)
Physical Therapy Treatment Patient Details Name: Danny Brady MRN: [id number] DOB: [birth year] Today's Date: 11/25/2019    History of Present Illness Pt is 86 yo male s/p IM nailing for R hip fx. 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    PT Comments    Co-tx overlap session for transfers for 6 minutes.    Pt had pain medication prior to session.  Participated in exercises as described below.  During ex's pt stated he had a skin cancerous area removed on L upper back prior to fall that he had forgotten about.  RN in to check and dressing changed.  To EOB with heavy mod/max a x 2 with pt hesitant to flex at hips leaning backwards.  Verbal and tactile cues to correct and he was able to sit EOB with min guard x 5 minutes.  While sitting OT arrived to assist with transfer.  Stood with mod a x 2 from elevated bed and was able to take several small steps heavily dependant on walker to recliner.  Fatigued with activity and was unable to progress gait today.  "I feel like I'm going to give out."  Remained in recliner to finish OT session.   Follow Up Recommendations  SNF     Equipment Recommendations       Recommendations for Other Services       Precautions / Restrictions Precautions Precautions: Fall Restrictions Weight Bearing Restrictions: Yes RLE Weight Bearing: Weight bearing as tolerated    Mobility  Bed Mobility Overal bed mobility: Needs Assistance Bed Mobility: Supine to Sit     Supine to sit: Mod assist;Max assist;+2 for physical assistance     General bed mobility comments: requires heavy +2 assist with cues to bend at hips and not lean back during transition  Transfers Overall transfer level: Needs assistance Equipment used: Rolling walker (2 wheeled) Transfers: Sit to/from Stand Sit to Stand: Mod assist;+2 physical assistance            Ambulation/Gait Ambulation/Gait  assistance: Mod assist;Min assist;+2 physical assistance;+2 safety/equipment Gait Distance (Feet): 3 Feet Assistive device: Rolling walker (2 wheeled) Gait Pattern/deviations: Step-to pattern;Wide base of support Gait velocity: decreased   General Gait Details: overall seemed to be in more pain today limiting gait to transfer only.   Stairs             Wheelchair Mobility    Modified Rankin (Stroke Patients Only)       Balance Overall balance assessment: Needs assistance Sitting-balance support: No upper extremity supported;Bilateral upper extremity supported Sitting balance-Leahy Scale: Poor Sitting balance - Comments: Initially mod a x 1 but progressed to min gaurd prior to transfer Postural control: Left lateral lean Standing balance support: Bilateral upper extremity supported Standing balance-Leahy Scale: Poor Standing balance comment: heavy reliance on walker                            Cognition Arousal/Alertness: Awake/alert Behavior During Therapy: WFL for tasks assessed/performed Overall Cognitive Status: Within Functional Limits for tasks assessed                                        Exercises General Exercises - Lower Extremity Ankle Circles/Pumps: AROM;Both;20 reps Short Arc Quad: AAROM;Strengthening;Right;10 reps Heel Slides: AAROM;Right;10 reps;Supine Hip  ABduction/ADduction: AAROM;Strengthening;Right;10 reps Other Exercises Other Exercises: sitting EOB x 5 mintues with min guard.    General Comments        Pertinent Vitals/Pain Pain Assessment: 0-10 Faces Pain Scale: Hurts even more Pain Location: R hip-with mobility Pain Descriptors / Indicators: Grimacing;Guarding;Sore;Moaning;Aching    Home Living                      Prior Function            PT Goals (current goals can now be found in the care plan section) Progress towards PT goals: Progressing toward goals    Frequency    BID       PT Plan Current plan remains appropriate    Co-evaluation PT/OT/SLP Co-Evaluation/Treatment: Yes Reason for Co-Treatment: Complexity of the patient's impairments (multi-system involvement);For patient/therapist safety PT goals addressed during session: Mobility/safety with mobility;Balance OT goals addressed during session: ADL's and self-care      AM-PAC PT "6 Clicks" Mobility   Outcome Measure  Help needed turning from your back to your side while in a flat bed without using bedrails?: A Lot Help needed moving from lying on your back to sitting on the side of a flat bed without using bedrails?: A Lot Help needed moving to and from a bed to a chair (including a wheelchair)?: A Lot Help needed standing up from a chair using your arms (e.g., wheelchair or bedside chair)?: A Lot Help needed to walk in hospital room?: A Lot Help needed climbing 3-5 steps with a railing? : Total 6 Click Score: 11    End of Session Equipment Utilized During Treatment: Gait belt;Oxygen Activity Tolerance: Patient limited by fatigue Patient left: in chair;with call bell/phone within reach;with chair alarm set Nurse Communication: Mobility status Pain - Right/Left: Right Pain - part of body: Hip     Time: 7096-2836 PT Time Calculation (min) (ACUTE ONLY): 23 min  Charges:  $Therapeutic Exercise: 8-22 mins                    Danielle Dess, PTA 11/25/19, 10:28 AM

## 2019-11-25 NOTE — TOC Transition Note (Signed)
Transition of Care [facility]) - CM/SW Discharge Note   Patient Details  Name: Danny Brady MRN: [id number] Date of Birth: [birth year]  Transition of Care [facility]) CM/SW Contact:  Barrie Dunker, RN Phone Number: 11/25/2019, 11:45 AM   Clinical Narrative:    Patient to DC to [facility] room 605 today via EMS  The patient's daughter Danny Brady made aware by VM  The bedside nurse to call report to [facility] and call EMS when ready The DC packet placed on the chart   Final next level of care: Skilled Nursing Facility Barriers to Discharge: Barriers Resolved   Patient Goals and CMS Choice Patient states their goals for this hospitalization and ongoing recovery are:: go home with family taking turns staying with him      Discharge Placement              Patient chooses bed at: [facility] Patient to be transferred to facility by: EMS Name of family member notified: Danny Brady Patient and family notified of of transfer: 11/25/19  Discharge Plan and Services   Discharge Planning Services: CM Consult            DME Arranged: N/A         HH Arranged: PT HH Agency: [facility] Date HH Agency Contacted: 11/22/19 Time HH Agency Contacted: 1205 Representative spoke with at [facility] Agency: Rosey Bath  Social Determinants of Health (SDOH) Interventions     Readmission Risk Interventions No flowsheet data found.

## 2019-11-25 NOTE — Progress Notes (Signed)
Occupational Therapy Treatment Patient Details Name: Danny Brady MRN: [id number] DOB: May 28, 1927 Today's Date: 11/25/2019    History of present illness Pt is 86 yo male s/p IM nailing for R hip fx. 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   OT comments  Pt seen for OT co-tx with PT this date. Pt required +2 Mod A for STS transfers. Pt instructed in seated LB dressing tasks using reacher to support initiation of donning underwear and pants over feet in seated position (pt fell while attempting to don pants in standing, leading to this admission). Pt verbalized understanding of visual demo and verbal instruction but in too much pain to trial himself. Pt continues to benefit from skilled OT services; continue to recommend SNF.   Follow Up Recommendations  SNF    Equipment Recommendations  None recommended by OT    Recommendations for Other Services      Precautions / Restrictions Precautions Precautions: Fall Restrictions Weight Bearing Restrictions: Yes RLE Weight Bearing: Weight bearing as tolerated       Mobility Bed Mobility Overal bed mobility: Needs Assistance Bed Mobility: Supine to Sit     Supine to sit: Mod assist;Max assist;+2 for physical assistance     General bed mobility comments: pt seated EOB with PTA upon OT's arrival  Transfers Overall transfer level: Needs assistance Equipment used: Rolling walker (2 wheeled) Transfers: Sit to/from Stand Sit to Stand: Mod assist;+2 physical assistance         General transfer comment: cues for hand placement and RLE mgt    Balance Overall balance assessment: Needs assistance Sitting-balance support: No upper extremity supported;Bilateral upper extremity supported Sitting balance-Leahy Scale: Poor Sitting balance - Comments: Initially mod a x 1 but progressed to min gaurd prior to transfer Postural control: Left lateral  lean Standing balance support: Bilateral upper extremity supported Standing balance-Leahy Scale: Poor Standing balance comment: heavy reliance on walker                           ADL either performed or assessed with clinical judgement   ADL Overall ADL's : Needs assistance/impaired                                       General ADL Comments: Continues to require significant assist for LB ADL tasks, pain limited, and +2 for ADL transfers     Vision Baseline Vision/History: Wears glasses Wears Glasses: At all times Patient Visual Report: No change from baseline     Perception     Praxis      Cognition Arousal/Alertness: Awake/alert Behavior During Therapy: WFL for tasks assessed/performed Overall Cognitive Status: Within Functional Limits for tasks assessed                                          Exercises  Other Exercises: Pt instructed in seated LB dressing tasks using reacher to support initiation of donning underwear and pants over feet in seated position (pt fell while attempting to don pants in standing, leading to this admission). Pt verbalized understanding of visual demo and verbal instruction but in too much pain to trial himself   Shoulder Instructions  General Comments      Pertinent Vitals/ Pain       Pain Assessment: 0-10 Pain Score: 10-Worst pain ever Faces Pain Scale: Hurts even more Pain Location: R hip-with mobility, decreasing to 5-6 at rest once in recliner Pain Descriptors / Indicators: Grimacing;Guarding;Sore;Moaning;Aching Pain Intervention(s): Limited activity within patient's tolerance;Monitored during session;Repositioned  Home Living                                          Prior Functioning/Environment              Frequency  Min 2X/week        Progress Toward Goals  OT Goals(current goals can now be found in the care plan section)  Progress towards OT  goals: Progressing toward goals  Acute Rehab OT Goals Patient Stated Goal: " I feel ok but ready to go home" OT Goal Formulation: With patient Time For Goal Achievement: 12/06/19 Potential to Achieve Goals: Good  Plan Discharge plan remains appropriate;Frequency remains appropriate    Co-evaluation      Reason for Co-Treatment: Complexity of the patient's impairments (multi-system involvement);For patient/therapist safety PT goals addressed during session: Mobility/safety with mobility;Balance OT goals addressed during session: ADL's and self-care      AM-PAC OT "6 Clicks" Daily Activity     Outcome Measure   Help from another person eating meals?: None Help from another person taking care of personal grooming?: A Little Help from another person toileting, which includes using toliet, bedpan, or urinal?: Total Help from another person bathing (including washing, rinsing, drying)?: A Lot Help from another person to put on and taking off regular upper body clothing?: A Little Help from another person to put on and taking off regular lower body clothing?: A Lot 6 Click Score: 15    End of Session Equipment Utilized During Treatment: Gait belt;Rolling walker  OT Visit Diagnosis: Other abnormalities of gait and mobility (R26.89);Pain;History of falling (Z91.81) Pain - Right/Left: Right Pain - part of body: Hip   Activity Tolerance Patient tolerated treatment well   Patient Left in chair;with call bell/phone within reach;with chair alarm set   Nurse Communication Other (comment)(check BLE for skin tears prior to reapplying SCDs)        Time: 3474-2595 OT Time Calculation (min): 16 min  Charges: OT General Charges $OT Visit: 1 Visit OT Treatments $Self Care/Home Management : 8-22 mins  Jeni Salles, MPH, MS, OTR/L ascom [phone] 11/25/19, 11:47 AM

## 2019-11-25 NOTE — Care Management Important Message (Signed)
Important Message  Patient Details  Name: Danny Brady MRN: [id number] Date of Birth: September 28, 1927   Medicare Important Message Given:  Yes     [name] A [name] 11/25/2019, 11:41 AM

## 2019-11-25 NOTE — Discharge Summary (Signed)
Physician Discharge Summary   Danny Brady  male DOB: April 20, 1927  QQI:[id number]  PCP: Danella Penton, MD  Admit date: 11/20/2019 Discharge date: 11/25/2019  Admitted From: home Disposition:  SNF CODE STATUS: Full code  Discharge Instructions    Diet - low sodium heart healthy   Complete by: As directed    Discharge instructions   Complete by: As directed    Weightbearing as tolerated with right leg.  Follow up in 2 weeks with DR. Bowers office for staple removal.  Call for appt [phone]  Follow up in 1 week with PCP to check hemoglobin level.  Dr. Darlin Priestly - -   Increase activity slowly   Complete by: As directed        Hospital Course:  For full details, please see H&P, progress notes, consult notes and ancillary notes.  Briefly,  Danny Brady is a 86 y.o. M with hx Afib not on AC, sCHF EF 45%, HTN who presented with fall and hip pain.  Patient was putting on his pants, when he lost balance and fell, had immediate right hip pain and could not walk.    In the ER, radiograph showed comminuted RIGHT femur fracture.  CXR clear and ECG showed rate controlled Afib.  Dr. Odis Luster consulted and recommended operative fixation.  RIGHT hip fracture S/p right intramedullary nailing 1/27 by Dr. Odis Luster Pt was seen by ortho on the day of discharge, and cleared for discharge.  Patient is weightbearing as tolerated RLE.  Continue pain control with Percocet PRN.  Pt will follow up in 2 weeks with DR. Bowers office for staple removal. Call for appt [phone].  Anemia, acute blood loss, suspect post-surgical Hgb 11 on admission (this appears to be baseline from 2019).  Post-op patient had Hgb drop to 7 g/dL. Transfused 1 unit PRBCs 1/28, hemoglobin still trending down.  No obvious external bleeding, melena or hematochezia.  Transfused 1 unit again 1/30.  Surgical site looks fine, hip swollen, suspect intrathigh hematoma.  Discussed with Dr. Odis Luster and Dr. Martha Clan, surgery.   Rate of bleed appeared very slow likely has stopped at this point.  Hgb remained stable in 7's for 2 days after last transfusion.  On the day of discharge, Hgb 7.3.  Pt is discharged on iron supplement.  Please follow up with PCP 1 week after discharge to check Hgb.  Atrial fibrillation, persistent, not on anticoagulation prior to presentation. CHA2DS2-Vasc 4.  Eliquis was not started during this hospitalization due to likely post-op bleeds and Hgb drop.  Rate controlled off nodal blocking agents.  Will defer decision to start anticoagulation for stroke ppx at outpatient followup.  Hypertension Chronic systolic CHF BP controlled.  Appeared euvolemic.  Continued home furosemide, spironolactone  Hypokalemia Resolved  Recent skin excision Finished 7 days of cephalexin.  No need to continue after discharge.   At risk of malnutrition Continued Ensure  BPH Continued finasteride    Discharge Diagnoses:  Principal Problem:   Closed comminuted fracture of right hip (HCC) Active Problems:   Hypertension   Chronic systolic CHF (congestive heart failure) (HCC)   GERD (gastroesophageal reflux disease)   Fall   Hypokalemia   PAF (paroxysmal atrial fibrillation) (HCC)   Closed right hip fracture [facility])    Discharge Instructions:  Allergies as of 11/25/2019      Reactions   Prednisone Other (See Comments)   Not able to sleep. Not able to sleep.   Tamsulosin Other (See Comments)  Unknown insomnia      Medication List    STOP taking these medications   cephALEXin 500 MG capsule Commonly known as: KEFLEX     TAKE these medications   acetaminophen 500 MG tablet Commonly known as: TYLENOL Take 1 tablet (500 mg total) by mouth every 6 (six) hours as needed. What changed:   when to take this  reasons to take this   aspirin 81 MG EC tablet Take 1 tablet (81 mg total) by mouth daily.   ferrous sulfate 325 (65 FE) MG tablet Take 1 tablet (325 mg total) by mouth daily  with breakfast. Start taking on: November 26, 2019   finasteride 5 MG tablet Commonly known as: PROSCAR Take 5 mg by mouth daily.   furosemide 20 MG tablet Commonly known as: LASIX Take 40 mg by mouth daily.   methocarbamol 500 MG tablet Commonly known as: ROBAXIN Take 1 tablet (500 mg total) by mouth every 8 (eight) hours as needed for muscle spasms.   omeprazole 20 MG capsule Commonly known as: PRILOSEC Take 20 mg by mouth daily.   oxyCODONE-acetaminophen 5-325 MG tablet Commonly known as: PERCOCET/ROXICET Take 1 tablet by mouth every 6 (six) hours as needed for moderate pain.   spironolactone 25 MG tablet Commonly known as: ALDACTONE Take 0.5 mg by mouth daily.        Contact information for follow-up providers    Lovell Sheehan, MD. Schedule an appointment as soon as possible for a visit on 12/10/2019.   Specialty: Orthopedic Surgery Why: @ 10:00 am Contact information: [place] [address] [phone]        Rusty Aus, MD. Schedule an appointment as soon as possible for a visit in 1 week.   Specialty: Internal Medicine Why: check hemoglobin.; patient to call for Appt Contact information: [place] [address] [phone]            Contact information for after-discharge care    Destination    [facility] Preferred SNF .   Service: Skilled Nursing Contact information: [place] [phone]                  Allergies  Allergen Reactions  . Prednisone Other (See Comments)    Not able to sleep. Not able to sleep.   . Tamsulosin Other (See Comments)    Unknown insomnia      The results of significant diagnostics from this hospitalization (including imaging, microbiology, ancillary and laboratory) are listed below for reference.   Consultations:   Procedures/Studies: DG Chest 1  View  Result Date: 11/20/2019 CLINICAL DATA:  [place]. Right hip pain. Suspected fracture. EXAM: CHEST  1 VIEW COMPARISON:  Chest x-ray 05/13/2018 FINDINGS: The heart is enlarged but stable. There is tortuosity and mild ectasia of the thoracic aorta. Chronic bronchitic type interstitial lung changes but no acute pulmonary findings. No pleural effusions. The bony thorax appears intact. Stable radiopaque foreign body noted in the right axilla. IMPRESSION: No acute cardiopulmonary findings. Stable cardiac enlargement. Electronically Signed   By: Marijo Sanes M.D.   On: 11/20/2019 08:57   CT Head Wo Contrast  Result Date: 11/20/2019 CLINICAL DATA:  Pt presents to ED via [facility] with c/o fall. Per EMS pt was putting his pants on when he fell onto the floor. Per EMS pt with outward rotation baseline, however rotation worse after fall, and shortening to  R leg, swelling is baseli.*comment was truncated*Headache, post traumatic EXAM: CT HEAD WITHOUT CONTRAST TECHNIQUE: Contiguous axial images were obtained from the base of the skull through the vertex without intravenous contrast. COMPARISON:  07/25/2018 FINDINGS: Brain: No acute intracranial hemorrhage. No focal mass lesion. No CT evidence of acute infarction. No midline shift or mass effect. No hydrocephalus. Basilar cisterns are patent. Chronic low-density extra-axial fluid collection over the LEFT frontal lobe not changed from prior. There are periventricular and subcortical white matter hypodensities. Generalized cortical atrophy. Vascular: No hyperdense vessel or unexpected calcification. Skull: Normal. Negative for fracture or focal lesion. Sinuses/Orbits: Paranasal sinuses and mastoid air cells are clear. Orbits are clear. Other: None. IMPRESSION: 1. No acute intracranial trauma. No change from prior. 2. White matter microvascular disease and atrophy. 3. 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 Electronically Signed   By: Genevive BiStewart  Edmunds M.D.   On:  11/20/2019 09:09   CT Hip Right Wo Contrast  Result Date: 11/20/2019 CLINICAL DATA:  Fall today with right hip pain and swelling. Intertrochanteric right femur fracture. EXAM: CT OF THE RIGHT HIP WITHOUT CONTRAST TECHNIQUE: Multidetector CT imaging of the right hip was performed according to the standard protocol. Multiplanar CT image reconstructions were also generated. COMPARISON:  Radiographs same date. FINDINGS: Bones/Joint/Cartilage Comminuted intertrochanteric right femur fracture is associated with mild superior displacement and varus angulation. The lesser trochanter is displaced medially by less than 1 cm. There is no significant extension of the fracture into the femoral neck or femoral head. The femoral head is located. No evidence of dislocation of the visualized right hemipelvis. Mild underlying right hip degenerative changes with a small right hip joint effusion. Ligaments Suboptimally assessed by CT. Muscles and Tendons Mild atrophy of the gluteus musculature. No evident tendon tear. Soft tissues No focal periarticular hematoma. There is mild edema within the subcutaneous fat anterolateral to the right hip. Iliofemoral atherosclerosis and mild sigmoid diverticulosis are noted. IMPRESSION: Comminuted intertrochanteric right femur fracture as described. Underlying mild right hip degenerative changes. Electronically Signed   By: Carey BullocksWilliam  Veazey M.D.   On: 11/20/2019 08:52   DG HIP OPERATIVE UNILAT W OR W/O PELVIS RIGHT  Result Date: 11/20/2019 CLINICAL DATA:  Fracture fixation. EXAM: OPERATIVE left HIP (WITH PELVIS IF PERFORMED) 4 VIEWS TECHNIQUE: Fluoroscopic spot image(s) were submitted for interpretation post-operatively. COMPARISON:  Radiographs 11/20/2019 FINDINGS: Fluoroscopic spot images demonstrate fixation of the intertrochanteric fracture with a intramedullary gamma nail and a fixating proximal dynamic hip screw. Good position and alignment without complicating features. IMPRESSION:  Internal fixation of intertrochanteric fracture. Electronically Signed   By: Rudie MeyerP.  Gallerani M.D.   On: 11/20/2019 15:38   DG Hip Unilat W or Wo Pelvis 2-3 Views Right  Result Date: 11/20/2019 CLINICAL DATA:  Fall today. Right hip pain and swelling. EXAM: DG HIP (WITH OR WITHOUT PELVIS) 2-3V RIGHT COMPARISON:  None. FINDINGS: The bones appear mildly demineralized. There is a comminuted and mildly displaced intertrochanteric right femur fracture. No dislocation or pelvic fracture identified. There are minimal degenerative changes of both hips and sacroiliac joints. Greater degenerative changes are noted within the lower lumbar spine. IMPRESSION: Comminuted and mildly displaced intertrochanteric right femur fracture. Electronically Signed   By: Carey BullocksWilliam  Veazey M.D.   On: 11/20/2019 08:54      Labs: BNP (last 3 results) Recent Labs    11/20/19 0804  BNP 286.0*   Basic Metabolic Panel: Recent Labs  Lab 11/21/19 0803 11/22/19 0356 11/23/19 0502 11/24/19 0509 11/25/19 0403  NA  136 135 135 135 137  K 4.1 4.1 4.1 3.6 4.1  CL 103 104 101 99 97*  CO2 24 26 25 29 26   GLUCOSE 151* 132* 118* 101* 119*  BUN 26* 27* 34* 33* 33*  CREATININE 1.11 0.96 1.01 0.98 0.93  CALCIUM 8.0* 8.2* 8.1* 8.0* 8.2*  MG 1.6* 2.0  --   --   --    Liver Function Tests: Recent Labs  Lab 11/20/19 0800  AST 19  ALT 11  ALKPHOS 55  BILITOT 0.9  PROT 7.2  ALBUMIN 4.0   No results for input(s): LIPASE, AMYLASE in the last 168 hours. No results for input(s): AMMONIA in the last 168 hours. CBC: Recent Labs  Lab 11/20/19 0800 11/20/19 0800 11/21/19 0803 11/21/19 0803 11/22/19 0356 11/22/19 0818 11/23/19 0502 11/24/19 0509 11/25/19 0403  WBC 5.1   < > 8.7  --  9.4  --  8.8 7.1 7.5  NEUTROABS 3.6  --   --   --   --   --   --   --   --   HGB 11.7*   < > 7.0*   < > 7.1* 7.1* 6.7* 7.2* 7.3*  HCT 36.5*   < > 21.8*   < > 21.6* 22.6* 20.6* 22.6* 23.1*  MCV 98.4   < > 99.1  --  96.4  --  97.6 95.4 96.3  PLT  151   < > 132*  --  129*  --  137* 156 196   < > = values in this interval not displayed.   Cardiac Enzymes: No results for input(s): CKTOTAL, CKMB, CKMBINDEX, TROPONINI in the last 168 hours. BNP: Invalid input(s): POCBNP CBG: No results for input(s): GLUCAP in the last 168 hours. D-Dimer No results for input(s): DDIMER in the last 72 hours. Hgb A1c No results for input(s): HGBA1C in the last 72 hours. Lipid Profile No results for input(s): CHOL, HDL, LDLCALC, TRIG, CHOLHDL, LDLDIRECT in the last 72 hours. Thyroid function studies No results for input(s): TSH, T4TOTAL, T3FREE, THYROIDAB in the last 72 hours.  Invalid input(s): FREET3 Anemia work up No results for input(s): VITAMINB12, FOLATE, FERRITIN, TIBC, IRON, RETICCTPCT in the last 72 hours. Urinalysis No results found for: COLORURINE, APPEARANCEUR, LABSPEC, PHURINE, GLUCOSEU, HGBUR, BILIRUBINUR, KETONESUR, PROTEINUR, UROBILINOGEN, NITRITE, LEUKOCYTESUR Sepsis Labs Invalid input(s): PROCALCITONIN,  WBC,  LACTICIDVEN Microbiology Recent Results (from the past 240 hour(s))  Respiratory Panel by RT PCR (Flu A&B, Covid) - Nasopharyngeal Swab     Status: None   Collection Time: 11/20/19  7:21 AM   Specimen: Nasopharyngeal Swab  Result Value Ref Range Status   SARS Coronavirus 2 by RT PCR NEGATIVE NEGATIVE Final    Comment: (NOTE) SARS-CoV-2 target nucleic acids are NOT DETECTED. The SARS-CoV-2 RNA is generally detectable in upper respiratoy specimens during the acute phase of infection. The lowest concentration of SARS-CoV-2 viral copies this assay can detect is 131 copies/mL. A negative result does not preclude SARS-Cov-2 infection and should not be used as the sole basis for treatment or other patient management decisions. A negative result may occur with  improper specimen collection/handling, submission of specimen other than nasopharyngeal swab, presence of viral mutation(s) within the areas targeted by this assay, and  inadequate number of viral copies (<131 copies/mL). A negative result must be combined with clinical observations, patient history, and epidemiological information. The expected result is Negative. Fact Sheet for Patients:  11/22/19 Fact Sheet for Healthcare Providers:  https://www.moore.com/ This test is not  yet ap proved or cleared by the [place] and  has been authorized for detection and/or diagnosis of SARS-CoV-2 by FDA under an Emergency Use Authorization (EUA). This EUA will remain  in effect (meaning this test can be used) for the duration of the COVID-19 declaration under Section 564(b)(1) of the Act, 21 U.S.C. section 360bbb-3(b)(1), unless the authorization is terminated or revoked sooner.    Influenza A by PCR NEGATIVE NEGATIVE Final   Influenza B by PCR NEGATIVE NEGATIVE Final    Comment: (NOTE) The Xpert Xpress SARS-CoV-2/FLU/RSV assay is intended as an aid in  the diagnosis of influenza from Nasopharyngeal swab specimens and  should not be used as a sole basis for treatment. Nasal washings and  aspirates are unacceptable for Xpert Xpress SARS-CoV-2/FLU/RSV  testing. Fact Sheet for Patients: https://www.moore.com/ Fact Sheet for Healthcare Providers: https://www.young.biz/ This test is not yet approved or cleared by the [place] FDA and  has been authorized for detection and/or diagnosis of SARS-CoV-2 by  FDA under an Emergency Use Authorization (EUA). This EUA will remain  in effect (meaning this test can be used) for the duration of the  Covid-19 declaration under Section 564(b)(1) of the Act, 21  U.S.C. section 360bbb-3(b)(1), unless the authorization is  terminated or revoked. Performed at [facility], [address] [place], [place] [address]   SARS CORONAVIRUS 2 (TAT 6-24 HRS) Nasopharyngeal Nasopharyngeal Swab     Status: None   Collection  Time: 11/23/19 12:10 PM   Specimen: Nasopharyngeal Swab  Result Value Ref Range Status   SARS Coronavirus 2 NEGATIVE NEGATIVE Final    Comment: (NOTE) SARS-CoV-2 target nucleic acids are NOT DETECTED. The SARS-CoV-2 RNA is generally detectable in upper and lower respiratory specimens during the acute phase of infection. Negative results do not preclude SARS-CoV-2 infection, do not rule out co-infections with other pathogens, and should not be used as the sole basis for treatment or other patient management decisions. Negative results must be combined with clinical observations, patient history, and epidemiological information. The expected result is Negative. Fact Sheet for Patients: HairSlick.no Fact Sheet for Healthcare Providers: quierodirigir.com This test is not yet approved or cleared by the [place] FDA and  has been authorized for detection and/or diagnosis of SARS-CoV-2 by FDA under an Emergency Use Authorization (EUA). This EUA will remain  in effect (meaning this test can be used) for the duration of the COVID-19 declaration under Section 56 4(b)(1) of the Act, 21 U.S.C. section 360bbb-3(b)(1), unless the authorization is terminated or revoked sooner. Performed at [facility], [address], [place], [place] [address]      Total time spend on discharging this patient, including the last patient exam, discussing the hospital stay, instructions for ongoing care as it relates to all pertinent caregivers, as well as preparing the medical discharge records, prescriptions, and/or referrals as applicable, is 35 minutes.    Darlin Priestly, MD  [facility] 11/25/2019, 11:30 AM  If 7PM-7AM, please contact night-coverage

## 2019-12-04 DIAGNOSIS — Z8781 Personal history of (healed) traumatic fracture: Secondary | ICD-10-CM | POA: Insufficient documentation

## 2019-12-13 ENCOUNTER — Inpatient Hospital Stay
Admission: EM | Admit: 2019-12-13 | Discharge: 2019-12-16 | DRG: 292 | Disposition: A | Discharge status: Home Health | Payer: Medicare PPO | Attending: Internal Medicine | Admitting: Internal Medicine

## 2019-12-13 ENCOUNTER — Encounter: Payer: Self-pay | Admitting: Emergency Medicine

## 2019-12-13 ENCOUNTER — Emergency Department: Payer: Medicare PPO

## 2019-12-13 ENCOUNTER — Other Ambulatory Visit: Payer: Self-pay

## 2019-12-13 ENCOUNTER — Other Ambulatory Visit: Payer: Self-pay | Admitting: Family Medicine

## 2019-12-13 ENCOUNTER — Other Ambulatory Visit (HOSPITAL_COMMUNITY): Payer: Self-pay | Admitting: Family Medicine

## 2019-12-13 DIAGNOSIS — R748 Abnormal levels of other serum enzymes: Secondary | ICD-10-CM

## 2019-12-13 DIAGNOSIS — E872 Acidosis, unspecified: Secondary | ICD-10-CM

## 2019-12-13 DIAGNOSIS — I5032 Chronic diastolic (congestive) heart failure: Secondary | ICD-10-CM | POA: Diagnosis present

## 2019-12-13 DIAGNOSIS — E876 Hypokalemia: Secondary | ICD-10-CM

## 2019-12-13 DIAGNOSIS — D62 Acute posthemorrhagic anemia: Secondary | ICD-10-CM | POA: Diagnosis not present

## 2019-12-13 DIAGNOSIS — R109 Unspecified abdominal pain: Secondary | ICD-10-CM

## 2019-12-13 DIAGNOSIS — I959 Hypotension, unspecified: Secondary | ICD-10-CM | POA: Diagnosis present

## 2019-12-13 DIAGNOSIS — I700 Atherosclerosis of aorta: Secondary | ICD-10-CM | POA: Diagnosis present

## 2019-12-13 DIAGNOSIS — I4819 Other persistent atrial fibrillation: Secondary | ICD-10-CM | POA: Diagnosis present

## 2019-12-13 DIAGNOSIS — S41112A Laceration without foreign body of left upper arm, initial encounter: Secondary | ICD-10-CM | POA: Diagnosis present

## 2019-12-13 DIAGNOSIS — K5790 Diverticulosis of intestine, part unspecified, without perforation or abscess without bleeding: Secondary | ICD-10-CM | POA: Diagnosis present

## 2019-12-13 DIAGNOSIS — D696 Thrombocytopenia, unspecified: Secondary | ICD-10-CM | POA: Diagnosis not present

## 2019-12-13 DIAGNOSIS — I480 Paroxysmal atrial fibrillation: Secondary | ICD-10-CM | POA: Diagnosis present

## 2019-12-13 DIAGNOSIS — I10 Essential (primary) hypertension: Secondary | ICD-10-CM | POA: Diagnosis present

## 2019-12-13 DIAGNOSIS — R7401 Elevation of levels of liver transaminase levels: Secondary | ICD-10-CM

## 2019-12-13 DIAGNOSIS — K8020 Calculus of gallbladder without cholecystitis without obstruction: Secondary | ICD-10-CM | POA: Diagnosis present

## 2019-12-13 DIAGNOSIS — Z79899 Other long term (current) drug therapy: Secondary | ICD-10-CM

## 2019-12-13 DIAGNOSIS — Z20822 Contact with and (suspected) exposure to covid-19: Secondary | ICD-10-CM | POA: Diagnosis present

## 2019-12-13 DIAGNOSIS — I110 Hypertensive heart disease with heart failure: Principal | ICD-10-CM | POA: Diagnosis present

## 2019-12-13 DIAGNOSIS — I5022 Chronic systolic (congestive) heart failure: Secondary | ICD-10-CM | POA: Diagnosis present

## 2019-12-13 DIAGNOSIS — S72091D Other fracture of head and neck of right femur, subsequent encounter for closed fracture with routine healing: Secondary | ICD-10-CM

## 2019-12-13 DIAGNOSIS — R7989 Other specified abnormal findings of blood chemistry: Secondary | ICD-10-CM | POA: Diagnosis not present

## 2019-12-13 DIAGNOSIS — S72001D Fracture of unspecified part of neck of right femur, subsequent encounter for closed fracture with routine healing: Secondary | ICD-10-CM | POA: Diagnosis not present

## 2019-12-13 DIAGNOSIS — S72091A Other fracture of head and neck of right femur, initial encounter for closed fracture: Secondary | ICD-10-CM | POA: Diagnosis present

## 2019-12-13 DIAGNOSIS — R17 Unspecified jaundice: Secondary | ICD-10-CM

## 2019-12-13 DIAGNOSIS — X58XXXD Exposure to other specified factors, subsequent encounter: Secondary | ICD-10-CM | POA: Diagnosis present

## 2019-12-13 LAB — LACTIC ACID, PLASMA
Lactic Acid, Venous: 5.3 mmol/L (ref 0.5–1.9)
Lactic Acid, Venous: 4.5 mmol/L (ref 0.5–1.9)

## 2019-12-13 LAB — COMPREHENSIVE METABOLIC PANEL
ALT: 98 U/L — ABNORMAL HIGH (ref 0–44)
AST: 180 U/L — ABNORMAL HIGH (ref 15–41)
Albumin: 2.8 g/dL — ABNORMAL LOW (ref 3.5–5.0)
Alkaline Phosphatase: 211 U/L — ABNORMAL HIGH (ref 38–126)
Anion gap: 18 — ABNORMAL HIGH (ref 5–15)
BUN: 24 mg/dL — ABNORMAL HIGH (ref 8–23)
CO2: 20 mmol/L — ABNORMAL LOW (ref 22–32)
Calcium: 8.4 mg/dL — ABNORMAL LOW (ref 8.9–10.3)
Chloride: 94 mmol/L — ABNORMAL LOW (ref 98–111)
Creatinine, Ser: 1.3 mg/dL — ABNORMAL HIGH (ref 0.61–1.24)
GFR calc Af Amer: 56 mL/min — ABNORMAL LOW (ref >60)
GFR calc non Af Amer: 48 mL/min — ABNORMAL LOW (ref >60)
Glucose, Bld: 126 mg/dL — ABNORMAL HIGH (ref 70–99)
Potassium: 3.1 mmol/L — ABNORMAL LOW (ref 3.5–5.1)
Sodium: 132 mmol/L — ABNORMAL LOW (ref 135–145)
Total Bilirubin: 5.4 mg/dL — ABNORMAL HIGH (ref 0.3–1.2)
Total Protein: 5.7 g/dL — ABNORMAL LOW (ref 6.5–8.1)

## 2019-12-13 LAB — ACETAMINOPHEN LEVEL: Acetaminophen (Tylenol), Serum: 16 ug/mL (ref 10–30)

## 2019-12-13 LAB — RESPIRATORY PANEL BY RT PCR (FLU A&B, COVID)
Influenza A by PCR: NEGATIVE
Influenza B by PCR: NEGATIVE
SARS Coronavirus 2 by RT PCR: NEGATIVE

## 2019-12-13 LAB — LIPASE, BLOOD: Lipase: 23 U/L (ref 11–51)

## 2019-12-13 MED ORDER — SODIUM CHLORIDE 0.9 % IV BOLUS
1000.0000 mL | Freq: Once | INTRAVENOUS | Status: AC
  Administered 2019-12-13: 1000 mL via INTRAVENOUS

## 2019-12-13 MED ORDER — DEXTROSE 5 % IV SOLN
15.0000 mg/kg/h | INTRAVENOUS | Status: DC
  Administered 2019-12-13 – 2019-12-14 (×2): 15 mg/kg/h via INTRAVENOUS
  Filled 2019-12-13 (×4): qty 120

## 2019-12-13 MED ORDER — POTASSIUM CHLORIDE IN NACL 40-0.9 MEQ/L-% IV SOLN
INTRAVENOUS | Status: DC
  Administered 2019-12-13: 125 mL/h via INTRAVENOUS
  Filled 2019-12-13 (×3): qty 1000

## 2019-12-13 MED ORDER — IOHEXOL 300 MG/ML  SOLN
100.0000 mL | Freq: Once | INTRAMUSCULAR | Status: AC | PRN
  Administered 2019-12-13: 100 mL via INTRAVENOUS

## 2019-12-13 MED ORDER — PANTOPRAZOLE SODIUM 40 MG PO TBEC
40.0000 mg | DELAYED_RELEASE_TABLET | Freq: Every day | ORAL | Status: DC
  Administered 2019-12-14 – 2019-12-16 (×3): 40 mg via ORAL
  Filled 2019-12-13 (×3): qty 1

## 2019-12-13 MED ORDER — FINASTERIDE 5 MG PO TABS
5.0000 mg | ORAL_TABLET | Freq: Every day | ORAL | Status: DC
  Administered 2019-12-14 – 2019-12-16 (×3): 5 mg via ORAL
  Filled 2019-12-13 (×3): qty 1

## 2019-12-13 MED ORDER — SODIUM CHLORIDE 0.9 % IV SOLN
1.0000 g | Freq: Once | INTRAVENOUS | Status: AC
  Administered 2019-12-13: 1 g via INTRAVENOUS
  Filled 2019-12-13: qty 10

## 2019-12-13 MED ORDER — OXYCODONE HCL 5 MG PO TABS: 5.0000 mg | ORAL_TABLET | ORAL | Status: DC | PRN

## 2019-12-13 MED ORDER — ENOXAPARIN SODIUM 40 MG/0.4ML ~~LOC~~ SOLN
40.0000 mg | SUBCUTANEOUS | Status: DC
  Administered 2019-12-13 – 2019-12-15 (×3): 40 mg via SUBCUTANEOUS
  Filled 2019-12-13 (×3): qty 0.4

## 2019-12-13 MED ORDER — METHOCARBAMOL 500 MG PO TABS
500.0000 mg | ORAL_TABLET | Freq: Three times a day (TID) | ORAL | Status: DC | PRN
  Filled 2019-12-13: qty 1

## 2019-12-13 MED ORDER — ACETYLCYSTEINE LOAD VIA INFUSION
150.0000 mg/kg | Freq: Once | INTRAVENOUS | Status: AC
  Administered 2019-12-13: 13050 mg via INTRAVENOUS
  Filled 2019-12-13: qty 327

## 2019-12-13 NOTE — H&P (Signed)
History and Physical        Hospital Admission Note Date: 12/13/2019  Patient name: Danny Brady Medical record number: [id number] Date of birth: Apr 05, 1927 Age: 86 y.o. Gender: male  PCP: Rusty Aus, MD    Patient coming from: Home, directed to go to ER by PCP   I have reviewed all records in the [facility].    Chief Complaint:  Abdominal Pain  Vomiting  Transaminitis   HPI: Danny Brady is 86 y.o. male with PMH of HTN, HFrEF, recent right hip fracture, and Afib who presents to ED for abdominal pain, nausea, and vomiting after having been found to have elevated LFTs at PCPs office today. Reports that his abdominal pain started last night about 2 hours after eating chili. Abdominal pain was diffuse and not localized. This morning he had nausea and vomiting, about 4-5 episodes of clear emesis, non-bloody. Son reports he took patient's rectal temperature and it was 101.8 this morning but it was normal at PCP's office. Patient had a recent hip fracture. Has been taking 1000 mg of Tylenol between 2-4x per day. Last dose was around lunchtime today.    ED work-up/course:   Patient found to have elevated LFTs, Tbili, Alk Phos. Tylenol level ordered, pending at admission. RUQ without evidence of cholecystitis or cholangitis. CTX x1 given. CT abdomen ordered and pending.   Review of Systems: Positives marked in 'bold' Constitutional: Denies fever, chills, diaphoresis, poor appetite and fatigue.  HEENT: Denies photophobia, eye pain, redness, hearing loss, ear pain, congestion, sore throat, rhinorrhea, sneezing, mouth sores, trouble swallowing, neck pain, neck stiffness and tinnitus.   Respiratory: Denies SOB, DOE, cough, chest tightness,  and wheezing.   Cardiovascular: Denies chest pain, palpitations and leg swelling.  Gastrointestinal: Denies nausea, vomiting, abdominal pain, diarrhea, constipation, blood in stool and  abdominal distention.  Genitourinary: Denies dysuria, urgency, frequency, hematuria, flank pain and difficulty urinating.  Musculoskeletal: Denies myalgias, back pain, joint swelling, arthralgias and gait problem.  Skin: Denies pallor, rash and wound.  Neurological: Denies dizziness, seizures, syncope, weakness, light-headedness, numbness and headaches.  Hematological: Denies adenopathy. Easy bruising, personal or family bleeding history  Psychiatric/Behavioral: Denies suicidal ideation, mood changes, confusion, nervousness, sleep disturbance and agitation  Past Medical History: Past Medical History:  Diagnosis Date  . Edema   . Hypertension     Past Surgical History:  Procedure Laterality Date  . BLADDER SURGERY    . HERNIA REPAIR    . INTRAMEDULLARY (IM) NAIL INTERTROCHANTERIC Right 11/20/2019   Procedure: INTRAMEDULLARY (IM) NAIL INTERTROCHANTRIC;  Surgeon: Lovell Sheehan, MD;  Location: [facility] ORS;  Service: Orthopedics;  Laterality: Right;    Medications: Prior to Admission medications   Medication Sig Start Date End Date Taking? Authorizing Provider  acetaminophen (TYLENOL) 500 MG tablet Take 1 tablet (500 mg total) by mouth every 6 (six) hours as needed. 11/25/19   Enzo Bi, MD  aspirin EC 81 MG EC tablet Take 1 tablet (81 mg total) by mouth daily. 11/22/19   Carlynn Spry, PA-C  ferrous sulfate 325 (65 FE) MG tablet Take 1 tablet (325 mg total) by mouth daily with breakfast. 11/26/19   Billie Ruddy,  Otila Kluver, MD  finasteride (PROSCAR) 5 MG tablet Take 5 mg by mouth daily. 11/12/19   [provider]  furosemide (LASIX) 20 MG tablet Take 40 mg by mouth daily. 07/18/19   [provider]  methocarbamol (ROBAXIN) 500 MG tablet Take 1 tablet (500 mg total) by mouth every 8 (eight) hours as needed for muscle spasms. 11/25/19   Enzo Bi, MD  omeprazole (PRILOSEC) 20 MG capsule Take 20 mg by mouth daily. 10/31/19   [provider]  oxyCODONE-acetaminophen (PERCOCET/ROXICET) 5-325  MG tablet Take 1 tablet by mouth every 6 (six) hours as needed for moderate pain. 11/25/19   Enzo Bi, MD  spironolactone (ALDACTONE) 25 MG tablet Take 0.5 mg by mouth daily. 11/19/19   [provider]    Allergies:   Allergies  Allergen Reactions  . Prednisone Other (See Comments)    Not able to sleep. Not able to sleep.   . Tamsulosin Other (See Comments)    Unknown insomnia     Social History:  reports that he has never smoked. He has never used smokeless tobacco. He reports previous alcohol use. He reports that he does not use drugs.  Family History: History reviewed. No pertinent family history.  Physical Exam: Blood pressure (!) 90/57, pulse 83, temperature 98.1 F (36.7 C), temperature source Oral, resp. rate (!) 21, height 5' 8"  (1.727 m), weight 87 kg, SpO2 99 %. General: Alert, awake, oriented x3, in no acute distress. Eyes: pink conjunctiva,sclearal icterus, pupils equal and reactive to light and accomodation, HEENT: normocephalic, atraumatic, oropharynx clear Neck: supple, no masses or lymphadenopathy, no goiter, no bruits, no JVD CVS: Regular rate and irregular rhythm, without murmurs, rubs or gallops. 2+ pitting LE edema (chronic) Resp : Clear to auscultation bilaterally, no wheezing, rales or rhonchi. GI : Soft, nontender, nondistended, positive bowel sounds, no masses. No hepatomegaly. No hernia.  Musculoskeletal: No clubbing or cyanosis, positive pedal pulses. No contracture. ROM intact  Neuro: Grossly intact, no focal neurological deficits, strength 5/5 upper and lower extremities bilaterally Psych: alert and oriented x 3, normal mood and affect Skin: no rashes or lesions, warm and dry, appears jaundiced    LABS on Admission: I have personally reviewed all the labs and imagings below    Basic Metabolic Panel: Recent Labs  Lab 12/13/19 1747  NA 132*  K 3.1*  CL 94*  CO2 20*  GLUCOSE 126*  BUN 24*  CREATININE 1.30*  CALCIUM 8.4*   Liver  Function Tests: Recent Labs  Lab 12/13/19 1747  AST 180*  ALT 98*  ALKPHOS 211*  BILITOT 5.4*  PROT 5.7*  ALBUMIN 2.8*   Recent Labs  Lab 12/13/19 1747  LIPASE 23   No results for input(s): AMMONIA in the last 168 hours. CBC: No results for input(s): WBC, NEUTROABS, HGB, HCT, MCV, PLT in the last 168 hours. Cardiac Enzymes: No results for input(s): CKTOTAL, CKMB, CKMBINDEX, TROPONINI in the last 168 hours. BNP: Invalid input(s): POCBNP CBG: No results for input(s): GLUCAP in the last 168 hours.  Radiological Exams on Admission:  CT ABDOMEN PELVIS W CONTRAST  Result Date: 12/13/2019 CLINICAL DATA:  Generalized abdominal pain status post eating [place]. EXAM: CT ABDOMEN AND PELVIS WITH CONTRAST TECHNIQUE: Multidetector CT imaging of the abdomen and pelvis was performed using the standard protocol following bolus administration of intravenous contrast. CONTRAST:  159m OMNIPAQUE IOHEXOL 300 MG/ML  SOLN COMPARISON:  None. FINDINGS: Lower chest: The lung bases are clear. The heart is enlarged. Hepatobiliary: The liver  is normal. There is suggestion of pericholecystic free fluid with mild gallbladder wall thickening.There is no biliary ductal dilation. Pancreas: Normal contours without ductal dilatation. No peripancreatic fluid collection. Spleen: The spleen is borderline enlarged. Adrenals/Urinary Tract: --Adrenal glands: No adrenal hemorrhage. --Right kidney/ureter: No hydronephrosis or perinephric hematoma. --Left kidney/ureter: No hydronephrosis or perinephric hematoma. --Urinary bladder: There is mild bladder wall thickening. Stomach/Bowel: --Stomach/Duodenum: No hiatal hernia or other gastric abnormality. Normal duodenal course and caliber. --Small bowel: No dilatation or inflammation. --Colon: Rectosigmoid diverticulosis without acute inflammation. --Appendix: Not visualized. No right lower quadrant inflammation or free fluid. Vascular/Lymphatic: Atherosclerotic calcification is present  within the non-aneurysmal abdominal aorta, without hemodynamically significant stenosis. --No retroperitoneal lymphadenopathy. --No mesenteric lymphadenopathy. --No pelvic or inguinal lymphadenopathy. Reproductive: Unremarkable Other: No ascites or free air. The abdominal wall is normal. Musculoskeletal. Again noted is an L1 compression fracture. There are degenerative changes throughout the lumbar spine. There is no definite acute displaced fracture identified on this exam. The patient is status post prior intramedullary nail placement for an intratrochanteric fracture of the right femur. There are old right-sided anterior rib fractures. IMPRESSION: 1. Findings suspicious for gallbladder inflammation. Correlation with dedicated ultrasound is recommended. 2. Rectosigmoid diverticulosis without acute diverticulitis. 3. Mild bladder wall thickening. Recommend correlation with urinalysis. 4. Cardiomegaly. 5. Aortic Atherosclerosis (ICD10-I70.0). Electronically Signed   By: Constance Holster M.D.   On: 12/13/2019 20:12   US Abdomen Limited RUQ  Result Date: 12/13/2019 CLINICAL DATA:  Transaminitis EXAM: ULTRASOUND ABDOMEN LIMITED RIGHT UPPER QUADRANT COMPARISON:  None. FINDINGS: Gallbladder: A negative sonographic Percell Miller sign was reported by the sonographer. There is a single visible gallstone. No pericholecystic fluid or gallbladder wall thickening. Common bile duct: Diameter: 5 mm Liver: No focal lesion identified. Within normal limits in parenchymal echogenicity. Portal vein is patent on color Doppler imaging with normal direction of blood flow towards the liver. Other: None. IMPRESSION: Cholelithiasis without other evidence of acute cholecystitis. Electronically Signed   By: Ulyses Jarred M.D.   On: 12/13/2019 18:55      EKG: Independently reviewed.    Assessment/Plan Active Problems:   Hypertension   Chronic systolic CHF (congestive heart failure) (HCC)   Closed comminuted fracture of right hip  (HCC)   Hypokalemia   PAF (paroxysmal atrial fibrillation) (HCC)   Transaminitis   Transaminitis AST 180. ALT 89. Alk Phos 211. Tbili 5.4. Anion gap metabolic acidosis present. Lactic acid 5.3>4.5. RUQ ultrasound shows cholelithiasis without evidence of acute cholecystitis. CT abdomen unremarkable aside from findings concerning for gallbladder thickening and diverticulosis. Patient given CTX for potential intraabdominal infection in ER. Tylenol level 16. Patient has been ingesting tylenol chronically since hip fracture in late Jan. Given this was not an acute ingestion, Rumack-Matthew nomogram is not valid. Spoke to pharmacy who was connected to poison control; recommended proceeding with Acetadote infusion. Elevated liver function could potentially be related to a viral illness as well.  -COVID pending  -hepatic panel pending -salicylate level pending  -blood culture pending  -abx not continued for now  -Acetadote protocol per pharmacy  -check hepatitis panel and salicylate level  -repeat tylenol level about 22-24 hours after starting antidote  -repeat CMET in AM  -additional 1L NS bolus followed by 125 cc/hr  -consider GI consult if not improving   Electrolyte Derangements K 3.1 likely related to emesis. Na 132 likely related to poor PO intake and losses.  -additional 1L NS bolus followed by 125 cc/hr  -39mq KCl per 1L NS over 12 hours  -  repeat electrolytes in AM   HFpEF  Unknown EF as unable to find echo on record. Patient is hypotensive at presentation, improved with 1L NS.  -monitor volume status closely; warrants additional IVFs now given hypotension and lactic acidosis  -hold Lasix and Spironolactone with hypotension   Atrial Fibrillation  Without RVR. Not on anticoagulation given recent hip fracture.     DVT prophylaxis: Lovenox   CODE STATUS: FULL   Consults called: NONE   Family Communication: Admission, patients condition and plan of care including tests being  ordered have been discussed with the patient and patient's son who indicates understanding and agree with the plan and Code Status  Admission status: Inpatient   The medical decision making on this patient was of high complexity and the patient is at high risk for clinical deterioration, therefore this is a level 3 admission.  Severity of Illness:     Moderate  The appropriate patient status for this patient is INPATIENT. Inpatient status is judged to be reasonable and necessary in order to provide the required intensity of service to ensure the patient's safety. The patient's presenting symptoms, physical exam findings, and initial radiographic and laboratory data in the context of their chronic comorbidities is felt to place them at high risk for further clinical deterioration. Furthermore, it is not anticipated that the patient will be medically stable for discharge from the hospital within 2 midnights of admission. The following factors support the patient status of inpatient.   " The patient's presenting symptoms include abdominal pain, nausea/vomiting. " The worrisome physical exam findings include scleral icterus and jaundice. " The initial radiographic and laboratory data are worrisome because of elevated LFTs, elevated bili, lactic acidosis. " The chronic co-morbidities include HFrEF, Afib.   * I certify that at the point of admission it is my clinical judgment that the patient will require inpatient hospital care spanning beyond 2 midnights from the point of admission due to high intensity of service, high risk for further deterioration and high frequency of surveillance required.*    Time Spent on Admission: 58 minutes      Melina Schools D.O.  [facility] 12/13/2019, 9:02 PM

## 2019-12-13 NOTE — ED Triage Notes (Signed)
Pt presents to ED via [facility] c/o elevated liver labs from PCP this morning. Pt also had urine and covid test done today. BP in triage 79/43, IV started, pt brought to rm 10. Son with patient.

## 2019-12-13 NOTE — ED Notes (Signed)
MD at bedside. 

## 2019-12-13 NOTE — ED Provider Notes (Signed)
[facility] Emergency Department Provider Note   ____________________________________________   I have reviewed the triage vital signs and the nursing notes.   HISTORY  Chief Complaint Abnormal Lab   History limited by: Not Limited   HPI Danny Brady is a 86 y.o. male who presents to the emergency department today because of concerns of elevated liver enzymes.  The patient started having problems with the stomach yesterday after eating chili.  He developed pain across his lower abdomen.  He then had episodes of vomiting.  Only one slightly soft stool movement.  Son reports that the patient did have fever at home earlier today. This morning the patient went to primary care doctor's office for evaluation and had blood work drawn.  He was called back and was told that his liver enzymes were elevated.  The time my exam the patient denies any abdominal pain.  Denied any current nausea.  The son states that the patient has been taking Tylenol since having a hip surgery last month.  Records reviewed. Per medical record review patient has a history of recent hip fracture with surgery.  Past Medical History:  Diagnosis Date  . Edema   . Hypertension     Patient Active Problem List   Diagnosis Date Noted  . Closed right hip fracture ([place]) 11/20/2019  . Hypertension   . Chronic systolic CHF (congestive heart failure) ([place])   . GERD (gastroesophageal reflux disease)   . Fall   . Closed comminuted fracture of right hip ([place])   . Hypokalemia   . PAF (paroxysmal atrial fibrillation) ([place])     Past Surgical History:  Procedure Laterality Date  . BLADDER SURGERY    . HERNIA REPAIR    . INTRAMEDULLARY (IM) NAIL INTERTROCHANTERIC Right 11/20/2019   Procedure: INTRAMEDULLARY (IM) NAIL INTERTROCHANTRIC;  Surgeon: Lovell Sheehan, MD;  Location: [facility] ORS;  Service: Orthopedics;  Laterality: Right;    Prior to Admission medications   Medication Sig Start Date End  Date Taking? Authorizing Provider  acetaminophen (TYLENOL) 500 MG tablet Take 1 tablet (500 mg total) by mouth every 6 (six) hours as needed. 11/25/19   Enzo Bi, MD  aspirin EC 81 MG EC tablet Take 1 tablet (81 mg total) by mouth daily. 11/22/19   Carlynn Spry, PA-C  ferrous sulfate 325 (65 FE) MG tablet Take 1 tablet (325 mg total) by mouth daily with breakfast. 11/26/19   Enzo Bi, MD  finasteride (PROSCAR) 5 MG tablet Take 5 mg by mouth daily. 11/12/19   [provider]  furosemide (LASIX) 20 MG tablet Take 40 mg by mouth daily. 07/18/19   [provider]  methocarbamol (ROBAXIN) 500 MG tablet Take 1 tablet (500 mg total) by mouth every 8 (eight) hours as needed for muscle spasms. 11/25/19   Enzo Bi, MD  omeprazole (PRILOSEC) 20 MG capsule Take 20 mg by mouth daily. 10/31/19   [provider]  oxyCODONE-acetaminophen (PERCOCET/ROXICET) 5-325 MG tablet Take 1 tablet by mouth every 6 (six) hours as needed for moderate pain. 11/25/19   Enzo Bi, MD  spironolactone (ALDACTONE) 25 MG tablet Take 0.5 mg by mouth daily. 11/19/19   [provider]    Allergies Prednisone and Tamsulosin  History reviewed. No pertinent family history.  Social History Social History   Tobacco Use  . Smoking status: Never Smoker  . Smokeless tobacco: Never Used  Substance Use Topics  . Alcohol use: Not Currently  . Drug use: Never  Review of Systems Constitutional: Positive for fever. Eyes: No visual changes. ENT: No sore throat. Cardiovascular: Denies chest pain. Respiratory: Denies shortness of breath. Gastrointestinal: Positive for abdominal pain, nausea and vomiting.    Genitourinary: Negative for dysuria. Musculoskeletal: Negative for back pain. Skin: Negative for rash. Neurological: Negative for headaches, focal weakness or numbness.  ____________________________________________   PHYSICAL EXAM:  VITAL SIGNS: ED Triage Vitals  Enc Vitals Group     BP  12/13/19 1728 (!) 79/43     Pulse Rate 12/13/19 1728 86     Resp 12/13/19 1728 (!) 22     Temp 12/13/19 1728 98.1 F (36.7 C)     Temp Source 12/13/19 1728 Oral     SpO2 12/13/19 1728 95 %     Weight 12/13/19 1734 191 lb 12.8 oz (87 kg)     Height 12/13/19 1734 5' 8"  (1.727 m)     Head Circumference --      Peak Flow --      Pain Score 12/13/19 1734 0   Constitutional: Alert and oriented.  Eyes: Conjunctivae are normal.  ENT      Head: Normocephalic and atraumatic.      Nose: No congestion/rhinnorhea.      Mouth/Throat: Mucous membranes are moist.      Neck: No stridor. Hematological/Lymphatic/Immunilogical: No cervical lymphadenopathy. Cardiovascular: Normal rate, regular rhythm.  No murmurs, rubs, or gallops.  Respiratory: Normal respiratory effort without tachypnea nor retractions. Breath sounds are clear and equal bilaterally. No wheezes/rales/rhonchi. Gastrointestinal: Soft and non tender. No rebound. No guarding.  Genitourinary: Deferred Musculoskeletal: Normal range of motion in all extremities. No lower extremity edema. Neurologic:  Normal speech and language. No gross focal neurologic deficits are appreciated.  Skin:  Skin is warm, dry and intact. No rash noted. Psychiatric: Mood and affect are normal. Speech and behavior are normal. Patient exhibits appropriate insight and judgment.  ____________________________________________    LABS (pertinent positives/negatives)  Labwork from earlier office visit reviewed through care everywhere Lactic acid level 5.3 Lipase 23 CMP na 132, k 3.1, glu 126, cr 1.30, ast 180, alt 98, alk phos 211, t bili 5.4 ____________________________________________   EKG  None  ____________________________________________    RADIOLOGY  [place] RUQ Cholelithiasis without other concerning findings  ____________________________________________   PROCEDURES  Procedures  ____________________________________________   INITIAL  IMPRESSION / ASSESSMENT AND PLAN / ED COURSE  Pertinent labs & imaging results that were available during my care of the patient were reviewed by me and considered in my medical decision making (see chart for details).   Patient presented to the emergency department today because of concerns for elevated liver enzymes found to primary care doctor's office.  Patient went there because of concerns for abdominal pain nausea vomiting.  Patient's liver enzymes were elevated here as well.  This time is unclear etiology.  Right upper quadrant ultrasound was performed which did not show signs of cholecystitis or ductal dilatation.  Even that the patient had been on Tylenol since a hip fracture I did write for him to receive acetylcysteine.  However it is possible that it is non-Tylenol overdose could be viral infection or other cause of transaminitis.  Will plan on admission to the hospital service.  Discussed plan with patient and family.  ____________________________________________   FINAL CLINICAL IMPRESSION(S) / ED DIAGNOSES  Final diagnoses:  Transaminitis  Hypotension, unspecified hypotension type  Elevated lactic acid level     Note: This dictation was prepared with Dragon dictation. Any transcriptional  errors that result from this process are unintentional     Nance Pear, MD 12/13/19 2355

## 2019-12-13 NOTE — ED Notes (Signed)
Pt transported to CT ?

## 2019-12-13 NOTE — Progress Notes (Signed)
MEDICATION RELATED CONSULT NOTE - INITIAL   Pharmacy Consult for  APAP overdose  Indication:   Allergies  Allergen Reactions  . Prednisone Other (See Comments)    Not able to sleep. Not able to sleep.   . Tamsulosin Other (See Comments)    Unknown insomnia     Patient Measurements: Height: 5\' 8"  (172.7 cm) Weight: 191 lb 12.8 oz (87 kg) IBW/kg (Calculated) : 68.4 Adjusted Body Weight:   Vital Signs: Temp: 98.1 F (36.7 C) (02/19 1728) Temp Source: Oral (02/19 1728) BP: 110/68 (02/19 2145) Pulse Rate: 79 (02/19 2145) Intake/Output from previous day: No intake/output data recorded. Intake/Output from this shift: Total I/O In: 100 [IV Piggyback:100] Out: -   Labs: Recent Labs    12/13/19 1747  CREATININE 1.30*  ALBUMIN 2.8*  PROT 5.7*  AST 180*  ALT 98*  ALKPHOS 211*  BILITOT 5.4*   Estimated Creatinine Clearance: 40.5 mL/min (A) (by C-G formula based on SCr of 1.3 mg/dL (H)).   Microbiology: Recent Results (from the past 720 hour(s))  Respiratory Panel by RT PCR (Flu A&B, Covid) - Nasopharyngeal Swab     Status: None   Collection Time: 11/20/19  7:21 AM   Specimen: Nasopharyngeal Swab  Result Value Ref Range Status   SARS Coronavirus 2 by RT PCR NEGATIVE NEGATIVE Final    Comment: (NOTE) SARS-CoV-2 target nucleic acids are NOT DETECTED. The SARS-CoV-2 RNA is generally detectable in upper respiratoy specimens during the acute phase of infection. The lowest concentration of SARS-CoV-2 viral copies this assay can detect is 131 copies/mL. A negative result does not preclude SARS-Cov-2 infection and should not be used as the sole basis for treatment or other patient management decisions. A negative result may occur with  improper specimen collection/handling, submission of specimen other than nasopharyngeal swab, presence of viral mutation(s) within the areas targeted by this assay, and inadequate number of viral copies (<131 copies/mL). A negative result  must be combined with clinical observations, patient history, and epidemiological information. The expected result is Negative. Fact Sheet for Patients:  11/22/19 Fact Sheet for Healthcare Providers:  https://www.moore.com/ This test is not yet ap proved or cleared by the https://www.young.biz/ FDA and  has been authorized for detection and/or diagnosis of SARS-CoV-2 by FDA under an Emergency Use Authorization (EUA). This EUA will remain  in effect (meaning this test can be used) for the duration of the COVID-19 declaration under Section 564(b)(1) of the Act, 21 U.S.C. section 360bbb-3(b)(1), unless the authorization is terminated or revoked sooner.    Influenza A by PCR NEGATIVE NEGATIVE Final   Influenza B by PCR NEGATIVE NEGATIVE Final    Comment: (NOTE) The Xpert Xpress SARS-CoV-2/FLU/RSV assay is intended as an aid in  the diagnosis of influenza from Nasopharyngeal swab specimens and  should not be used as a sole basis for treatment. Nasal washings and  aspirates are unacceptable for Xpert Xpress SARS-CoV-2/FLU/RSV  testing. Fact Sheet for Patients: [place] Fact Sheet for Healthcare Providers: https://www.moore.com/ This test is not yet approved or cleared by the https://www.young.biz/ FDA and  has been authorized for detection and/or diagnosis of SARS-CoV-2 by  FDA under an Emergency Use Authorization (EUA). This EUA will remain  in effect (meaning this test can be used) for the duration of the  Covid-19 declaration under Section 564(b)(1) of the Act, 21  U.S.C. section 360bbb-3(b)(1), unless the authorization is  terminated or revoked. Performed at [facility], [address], [place], [place]  SARS CORONAVIRUS 2 (TAT 6-24 HRS) Nasopharyngeal Nasopharyngeal Swab     Status: None   Collection Time: 11/23/19 12:10 PM   Specimen: Nasopharyngeal Swab  Result Value  Ref Range Status   SARS Coronavirus 2 NEGATIVE NEGATIVE Final    Comment: (NOTE) SARS-CoV-2 target nucleic acids are NOT DETECTED. The SARS-CoV-2 RNA is generally detectable in upper and lower respiratory specimens during the acute phase of infection. Negative results do not preclude SARS-CoV-2 infection, do not rule out co-infections with other pathogens, and should not be used as the sole basis for treatment or other patient management decisions. Negative results must be combined with clinical observations, patient history, and epidemiological information. The expected result is Negative. Fact Sheet for Patients: HairSlick.no Fact Sheet for Healthcare Providers: quierodirigir.com This test is not yet approved or cleared by the [place] FDA and  has been authorized for detection and/or diagnosis of SARS-CoV-2 by FDA under an Emergency Use Authorization (EUA). This EUA will remain  in effect (meaning this test can be used) for the duration of the COVID-19 declaration under Section 56 4(b)(1) of the Act, 21 U.S.C. section 360bbb-3(b)(1), unless the authorization is terminated or revoked sooner. Performed at [facility], [address], [place], [place] [address]   Respiratory Panel by RT PCR (Flu A&B, Covid) - Nasopharyngeal Swab     Status: None   Collection Time: 12/13/19  8:14 PM   Specimen: Nasopharyngeal Swab  Result Value Ref Range Status   SARS Coronavirus 2 by RT PCR NEGATIVE NEGATIVE Final    Comment: (NOTE) SARS-CoV-2 target nucleic acids are NOT DETECTED. The SARS-CoV-2 RNA is generally detectable in upper respiratoy specimens during the acute phase of infection. The lowest concentration of SARS-CoV-2 viral copies this assay can detect is 131 copies/mL. A negative result does not preclude SARS-Cov-2 infection and should not be used as the sole basis for treatment or other patient management decisions. A  negative result may occur with  improper specimen collection/handling, submission of specimen other than nasopharyngeal swab, presence of viral mutation(s) within the areas targeted by this assay, and inadequate number of viral copies (<131 copies/mL). A negative result must be combined with clinical observations, patient history, and epidemiological information. The expected result is Negative. Fact Sheet for Patients:  https://www.moore.com/ Fact Sheet for Healthcare Providers:  https://www.young.biz/ This test is not yet ap proved or cleared by the [place] FDA and  has been authorized for detection and/or diagnosis of SARS-CoV-2 by FDA under an Emergency Use Authorization (EUA). This EUA will remain  in effect (meaning this test can be used) for the duration of the COVID-19 declaration under Section 564(b)(1) of the Act, 21 U.S.C. section 360bbb-3(b)(1), unless the authorization is terminated or revoked sooner.    Influenza A by PCR NEGATIVE NEGATIVE Final   Influenza B by PCR NEGATIVE NEGATIVE Final    Comment: (NOTE) The Xpert Xpress SARS-CoV-2/FLU/RSV assay is intended as an aid in  the diagnosis of influenza from Nasopharyngeal swab specimens and  should not be used as a sole basis for treatment. Nasal washings and  aspirates are unacceptable for Xpert Xpress SARS-CoV-2/FLU/RSV  testing. Fact Sheet for Patients: https://www.moore.com/ Fact Sheet for Healthcare Providers: https://www.young.biz/ This test is not yet approved or cleared by the [place] FDA and  has been authorized for detection and/or diagnosis of SARS-CoV-2 by  FDA under an Emergency Use Authorization (EUA). This EUA will remain  in effect (meaning this test can be used) for the duration  of the  Covid-19 declaration under Section 564(b)(1) of the Act, 21  U.S.C. section 360bbb-3(b)(1), unless the authorization is   terminated or revoked. Performed at [facility], [address] [place], [place] [address]     Medical History: Past Medical History:  Diagnosis Date  . Edema   . Hypertension     Medications:  Medications Prior to Admission  Medication Sig Dispense Refill Last Dose  . acetaminophen (TYLENOL) 500 MG tablet Take 1 tablet (500 mg total) by mouth every 6 (six) hours as needed.  0 12/13/2019 at 0900  . aspirin EC 81 MG EC tablet Take 1 tablet (81 mg total) by mouth daily. 30 tablet 0 12/13/2019 at 0900  . ferrous sulfate 325 (65 FE) MG tablet Take 1 tablet (325 mg total) by mouth daily with breakfast.  3 12/13/2019 at 0900  . finasteride (PROSCAR) 5 MG tablet Take 5 mg by mouth daily.   12/13/2019 at 0900  . furosemide (LASIX) 20 MG tablet Take 40 mg by mouth daily.   12/13/2019 at 0900  . omeprazole (PRILOSEC) 20 MG capsule Take 20 mg by mouth daily.   12/13/2019 at 0900  . spironolactone (ALDACTONE) 25 MG tablet Take 0.5 mg by mouth daily.   12/13/2019 at 0900  . methocarbamol (ROBAXIN) 500 MG tablet Take 1 tablet (500 mg total) by mouth every 8 (eight) hours as needed for muscle spasms.   unknown at prn  . ondansetron (ZOFRAN) 4 MG tablet Take 4 mg by mouth every 8 (eight) hours as needed for nausea/vomiting.     Marland Kitchen oxyCODONE-acetaminophen (PERCOCET/ROXICET) 5-325 MG tablet Take 1 tablet by mouth every 6 (six) hours as needed for moderate pain. 30 tablet 0 unknown at prn    Assessment: Pharmacy consulted to manage possible APAP overdose in this 86 year old male.  Pt was on ~ APAP 1 gm PO TID for past 3 weeks in addition to percocet 5/325 mg PO Q6H prn.   Pt was referred to ER by PCP due to elevated LFTS on 2/19.    Labs :  APAP @ 1922 = 16                 AST @ 2841 = 180                ALT @ 3244 = 98    Plan:  Contacted poison control.  They recommended starting N-AC and continuing for 24 hrs.  Will start N-AC 13,050 mg (150 mg/kg) IV X 1 bolus followed by N-AC 15  mg/kg/hr X 23 hrs.  Will recheck AST, ALT and APAP ~ 22 hrs after starting N-AC.  If labs are WNL, ok to d/c.  Contact [facility]ison control prior to d/c N-AC.   Julliana Whitmyer D 12/13/2019,10:39 PM

## 2019-12-13 NOTE — ED Notes (Signed)
Labs at bedside to draw blood, acetone level and blood cultures.

## 2019-12-13 NOTE — Consult Note (Deleted)
MEDICATION RELATED CONSULT NOTE - INITIAL   Pharmacy Consult for Acetylcysteine Indication: APAP overdose  Allergies  Allergen Reactions  . Prednisone Other (See Comments)    Not able to sleep. Not able to sleep.   . Tamsulosin Other (See Comments)    Unknown insomnia     Patient Measurements: Height: 5\' 8"  (172.7 cm) Weight: 191 lb 12.8 oz (87 kg) IBW/kg (Calculated) : 68.4  Vital Signs: Temp: 98.1 F (36.7 C) (02/19 1728) Temp Source: Oral (02/19 1728) BP: 110/68 (02/19 2145) Pulse Rate: 79 (02/19 2145)  Labs: Recent Labs    12/13/19 1747  CREATININE 1.30*  ALBUMIN 2.8*  PROT 5.7*  AST 180*  ALT 98*  ALKPHOS 211*  BILITOT 5.4*   Estimated Creatinine Clearance: 40.5 mL/min (A) (by C-G formula based on SCr of 1.3 mg/dL (H)).   Medical History: Past Medical History:  Diagnosis Date  . Edema   . Hypertension     Assessment: Patient is a 86 y/o M who presented from PCP office due to elevated LFTs. Patient has reportedly been ingesting 1000 mg APAP 2-4x/day. Upon admission labs were notable for transaminitis with values above patient's baseline. Anion gap elevated to 18. Lactate 5.3. APAP level 16. Pharmacy was consulted to initiate N-acetylcysteine for suspected APAP toxicity and to consult poison control center.   Poison control center okay with continuing NAC therapy but suggests investigating other etiologies for transaminitis. Not clear if APAP induced. Recommended re-checking APAP level 22 hours after initiation of NAC and trending LFTs and lactate. Consider ASA level. Spoke with attending regarding recommendations.   Goal of Therapy:  Replete glutathione stores to prevent hepatotoxicity  Plan:  -N-acetylcysteine 150 mg/kg load over 1 hour followed by 15 mg/kg/hr per order set (single-bag method) -Follow-up APAP level and CMP  95  Pharmacy Resident 12/13/2019,10:35 PM

## 2019-12-13 NOTE — ED Notes (Signed)
Admitting MD at bedside.

## 2019-12-14 ENCOUNTER — Other Ambulatory Visit: Payer: Self-pay

## 2019-12-14 LAB — COMPREHENSIVE METABOLIC PANEL
ALT: 89 U/L — ABNORMAL HIGH (ref 0–44)
ALT: 86 U/L — ABNORMAL HIGH (ref 0–44)
AST: 111 U/L — ABNORMAL HIGH (ref 15–41)
AST: 86 U/L — ABNORMAL HIGH (ref 15–41)
Albumin: 2.6 g/dL — ABNORMAL LOW (ref 3.5–5.0)
Albumin: 2.5 g/dL — ABNORMAL LOW (ref 3.5–5.0)
Alkaline Phosphatase: 182 U/L — ABNORMAL HIGH (ref 38–126)
Alkaline Phosphatase: 171 U/L — ABNORMAL HIGH (ref 38–126)
Anion gap: 20 — ABNORMAL HIGH (ref 5–15)
Anion gap: 14 (ref 5–15)
BUN: 27 mg/dL — ABNORMAL HIGH (ref 8–23)
BUN: 26 mg/dL — ABNORMAL HIGH (ref 8–23)
CO2: 18 mmol/L — ABNORMAL LOW (ref 22–32)
CO2: 18 mmol/L — ABNORMAL LOW (ref 22–32)
Calcium: 8 mg/dL — ABNORMAL LOW (ref 8.9–10.3)
Calcium: 8 mg/dL — ABNORMAL LOW (ref 8.9–10.3)
Chloride: 99 mmol/L (ref 98–111)
Chloride: 101 mmol/L (ref 98–111)
Creatinine, Ser: 1.01 mg/dL (ref 0.61–1.24)
Creatinine, Ser: 0.86 mg/dL (ref 0.61–1.24)
GFR calc Af Amer: >60 mL/min (ref >60)
GFR calc Af Amer: >60 mL/min (ref >60)
GFR calc non Af Amer: >60 mL/min (ref >60)
GFR calc non Af Amer: >60 mL/min (ref >60)
Glucose, Bld: 81 mg/dL (ref 70–99)
Glucose, Bld: 108 mg/dL — ABNORMAL HIGH (ref 70–99)
Potassium: 3.6 mmol/L (ref 3.5–5.1)
Potassium: 2.9 mmol/L — ABNORMAL LOW (ref 3.5–5.1)
Sodium: 137 mmol/L (ref 135–145)
Sodium: 133 mmol/L — ABNORMAL LOW (ref 135–145)
Total Bilirubin: 6.6 mg/dL — ABNORMAL HIGH (ref 0.3–1.2)
Total Bilirubin: 4.6 mg/dL — ABNORMAL HIGH (ref 0.3–1.2)
Total Protein: 5.8 g/dL — ABNORMAL LOW (ref 6.5–8.1)
Total Protein: 5.5 g/dL — ABNORMAL LOW (ref 6.5–8.1)

## 2019-12-14 LAB — CBC
HCT: 29.3 % — ABNORMAL LOW (ref 39.0–52.0)
Hemoglobin: 9.4 g/dL — ABNORMAL LOW (ref 13.0–17.0)
MCH: 31.1 pg (ref 26.0–34.0)
MCHC: 32.1 g/dL (ref 30.0–36.0)
MCV: 97 fL (ref 80.0–100.0)
Platelets: 178 10*3/uL (ref 150–400)
RBC: 3.02 MIL/uL — ABNORMAL LOW (ref 4.22–5.81)
RDW: 17.1 % — ABNORMAL HIGH (ref 11.5–15.5)
WBC: 22.8 10*3/uL — ABNORMAL HIGH (ref 4.0–10.5)
nRBC: 0 % (ref 0.0–0.2)

## 2019-12-14 LAB — ACETAMINOPHEN LEVEL: Acetaminophen (Tylenol), Serum: <10 ug/mL — ABNORMAL LOW (ref 10–30)

## 2019-12-14 LAB — SALICYLATE LEVEL: Salicylate Lvl: <7 mg/dL — ABNORMAL LOW (ref 7.0–30.0)

## 2019-12-14 MED ORDER — TRAZODONE HCL 50 MG PO TABS
50.0000 mg | ORAL_TABLET | Freq: Every evening | ORAL | Status: DC | PRN
  Administered 2019-12-14 – 2019-12-15 (×2): 50 mg via ORAL
  Filled 2019-12-14 (×3): qty 1

## 2019-12-14 NOTE — Progress Notes (Addendum)
Pt was admitted on the floor with no signs of distress. Pt was alert and oriented x4. VSS. Pt ascom is within pt reached and pt was educated bout safety. Will continue to monitor.  Update 0119: Pt is requesting something for sleep. Can we have an order for it. Notify prime. Will continue to monitor.  Update 0124: Ouma NP place order for trazodone 50 mg PRN for sleep. Will continue to monitor.  Update 0229: pt lactic at 4.5 now from 5.3. Notify prime if wants to do a recheck for lactic acid. Will continue to monitor. Talked to Ouma at [phone] and states will add the lactic acid lab in the morning.

## 2019-12-14 NOTE — Plan of Care (Signed)
  Problem: Education: Goal: Knowledge of General Education information will improve Description: Including pain rating scale, medication(s)/side effects and non-pharmacologic comfort measures Outcome: Progressing   Problem: Clinical Measurements: Goal: Ability to maintain clinical measurements within normal limits will improve Outcome: Progressing   Problem: Safety: Goal: Ability to remain free from injury will improve Outcome: Progressing   

## 2019-12-14 NOTE — Consult Note (Signed)
MEDICATION RELATED CONSULT NOTE   Pharmacy Consult for Investigation of drug-induced hepatotoxicity Indication: Elevated LFTs  Labs: Recent Labs    12/13/19 1747 12/14/19 0440  WBC  --  22.8*  HGB  --  9.4*  HCT  --  29.3*  PLT  --  178  CREATININE 1.30* 1.01  ALBUMIN 2.8* 2.6*  PROT 5.7* 5.8*  AST 180* 111*  ALT 98* 89*  ALKPHOS 211* 182*  BILITOT 5.4* 6.6*    Assessment: Patient is a 86 y/o M who presented to [facility] ED from PCP office for evaluation of elevated LFTs. Patient had been reportedly taking 1000 mg APAP 2-4x/day prior to presentation. Admission labs notable for elevated LFTs above baseline, anion gap to 18, and lactate of 5.3. Patient was started on NAC for suspected APAP induced hepatotoxicity. Pharmacy has been consulted to investigate possible drug-induced hepatotoxicity.   From chart it does not appear patient consumes alcohol and denied herbal supplements. Home medication list reviewed.  Conclusion:  -Home medications absent of drugs commonly associated with liver injury besides APAP -PPIs are rarely associated with hepatic injury (<1%)  Tressie Ellis  Pharmacy Resident 12/14/2019,2:42 PM

## 2019-12-14 NOTE — Consult Note (Signed)
Midge Minium, MD [facility]  [address], [address] [place], [place] [address] Phone: [phone] Fax : [phone]  Consultation  Referring Provider:     Dr. Irene Limbo Primary Care Physician:  Danella Penton, MD Primary Gastroenterologist:      Gentry Fitz Reason for Consultation:     Elevated liver enzyme  Date of Admission:  12/13/2019 Date of Consultation:  12/14/2019         HPI:   Danny Brady is a 86 y.o. male who presented to the emergency department with a report of stomach pains and the pain being in the low abdomen.  The patient is with his daughter during this interview.  On admission the son reported the patient did have a fever at home early that day and went to the primary care provider's office with blood drawn at that time.  At the primary care provider's office the patient was found to have a bilirubin of 4 with an AST of 224 ALT of 103 and an alkaline phosphatase of 275.  The patient was requested to go to the emergency department where repeat labs showed:  Component     Latest Ref Rng & Units 12/13/2019 12/14/2019  AST     15 - 41 U/L 180 (H) 111 (H)  ALT     0 - 44 U/L 98 (H) 89 (H)  Alkaline Phosphatase     38 - 126 U/L 211 (H) 182 (H)  Total Bilirubin     0.3 - 1.2 mg/dL 5.4 (H) 6.6 (H)   The patient's CT scan of the abdomen showed:  IMPRESSION: 1. Findings suspicious for gallbladder inflammation. Correlation with dedicated ultrasound is recommended. 2. Rectosigmoid diverticulosis without acute diverticulitis. 3. Mild bladder wall thickening. Recommend correlation with urinalysis. 4. Cardiomegaly. 5. Aortic Atherosclerosis   There is no sign of any bile duct dilatation or choledocholithiasis.  The patient was on Tylenol after having his hip replacement about a month ago.  The patient had a therapeutic Tylenol level but was started on treatment for possible Tylenol overdose in the ER.  Today the patient denies any further abdominal pain nausea or vomiting.   His appetite has been decreased somewhat since having the surgery.  Past Medical History:  Diagnosis Date  . Edema   . Hypertension     Past Surgical History:  Procedure Laterality Date  . BLADDER SURGERY    . HERNIA REPAIR    . INTRAMEDULLARY (IM) NAIL INTERTROCHANTERIC Right 11/20/2019   Procedure: INTRAMEDULLARY (IM) NAIL INTERTROCHANTRIC;  Surgeon: Lyndle Herrlich, MD;  Location: [facility] ORS;  Service: Orthopedics;  Laterality: Right;    Prior to Admission medications   Medication Sig Start Date End Date Taking? Authorizing Provider  acetaminophen (TYLENOL) 500 MG tablet Take 1 tablet (500 mg total) by mouth every 6 (six) hours as needed. 11/25/19  Yes Darlin Priestly, MD  aspirin EC 81 MG EC tablet Take 1 tablet (81 mg total) by mouth daily. 11/22/19  Yes Altamese Cabal, PA-C  ferrous sulfate 325 (65 FE) MG tablet Take 1 tablet (325 mg total) by mouth daily with breakfast. 11/26/19  Yes Darlin Priestly, MD  finasteride (PROSCAR) 5 MG tablet Take 5 mg by mouth daily. 11/12/19  Yes [provider]  furosemide (LASIX) 20 MG tablet Take 40 mg by mouth daily. 07/18/19  Yes [provider]  omeprazole (PRILOSEC) 20 MG capsule Take 20 mg by mouth daily. 10/31/19  Yes [provider]  spironolactone (ALDACTONE) 25 MG tablet  Take 0.5 mg by mouth daily. 11/19/19  Yes [provider]  methocarbamol (ROBAXIN) 500 MG tablet Take 1 tablet (500 mg total) by mouth every 8 (eight) hours as needed for muscle spasms. 11/25/19   Enzo Bi, MD  ondansetron (ZOFRAN) 4 MG tablet Take 4 mg by mouth every 8 (eight) hours as needed for nausea/vomiting. 12/13/19   [provider]  oxyCODONE-acetaminophen (PERCOCET/ROXICET) 5-325 MG tablet Take 1 tablet by mouth every 6 (six) hours as needed for moderate pain. 11/25/19   Enzo Bi, MD    History reviewed. No pertinent family history.   Social History   Tobacco Use  . Smoking status: Never Smoker  . Smokeless tobacco: Never Used  Substance  Use Topics  . Alcohol use: Not Currently  . Drug use: Never    Allergies as of 12/13/2019 - Review Complete 12/13/2019  Allergen Reaction Noted  . Prednisone Other (See Comments) 03/26/2014  . Tamsulosin Other (See Comments) 02/07/2014    Review of Systems:    All systems reviewed and negative except where noted in HPI.   Physical Exam:  Vital signs in last 24 hours: Temp:  [97.9 F (36.6 C)-98.7 F (37.1 C)] 98 F (36.7 C) (02/20 1219) Pulse Rate:  [72-94] 78 (02/20 1219) Resp:  [17-28] 18 (02/20 1219) BP: (68-118)/(39-76) 107/66 (02/20 1219) SpO2:  [95 %-100 %] 98 % (02/20 1219) Weight:  [87 kg-92.4 kg] 92.4 kg (02/19 2300) Last BM Date: 12/13/19 General:   Pleasant, cooperative in NAD Head:  Normocephalic and atraumatic. Eyes:   Positive scleral icterus.   Conjunctiva yellow. PERRLA. Ears:  Normal auditory acuity. Neck:  Supple; no masses or thyroidomegaly Lungs: Respirations even and unlabored. Lungs clear to auscultation bilaterally.   No wheezes, crackles, or rhonchi.  Heart: Irregular rate and rhythm;  Without murmur, clicks, rubs or gallops Abdomen:  Soft, nondistended, nontender. Normal bowel sounds. No appreciable masses or hepatomegaly.  No rebound or guarding.  Rectal:  Not performed. Msk:  Symmetrical without gross deformities.    Extremities:  Without edema, cyanosis or clubbing. Neurologic:  Alert and oriented x3;  grossly normal neurologically. Skin:  Intact without significant lesions or rashes.  Positive jaundice Cervical Nodes:  No significant cervical adenopathy. Psych:  Alert and cooperative. Normal affect.  LAB RESULTS: Recent Labs    12/14/19 0440  WBC 22.8*  HGB 9.4*  HCT 29.3*  PLT 178   BMET Recent Labs    12/13/19 1747 12/14/19 0440  NA 132* 137  K 3.1* 3.6  CL 94* 99  CO2 20* 18*  GLUCOSE 126* 81  BUN 24* 27*  CREATININE 1.30* 1.01  CALCIUM 8.4* 8.0*   LFT Recent Labs    12/14/19 0440  PROT 5.8*  ALBUMIN 2.6*  AST 111*   ALT 89*  ALKPHOS 182*  BILITOT 6.6*   PT/INR No results for input(s): LABPROT, INR in the last 72 hours.  STUDIES: CT ABDOMEN PELVIS W CONTRAST  Result Date: 12/13/2019 CLINICAL DATA:  Generalized abdominal pain status post eating [place]. EXAM: CT ABDOMEN AND PELVIS WITH CONTRAST TECHNIQUE: Multidetector CT imaging of the abdomen and pelvis was performed using the standard protocol following bolus administration of intravenous contrast. CONTRAST:  137mL OMNIPAQUE IOHEXOL 300 MG/ML  SOLN COMPARISON:  None. FINDINGS: Lower chest: The lung bases are clear. The heart is enlarged. Hepatobiliary: The liver is normal. There is suggestion of pericholecystic free fluid with mild gallbladder wall thickening.There is no biliary ductal dilation. Pancreas: Normal contours without ductal dilatation.  No peripancreatic fluid collection. Spleen: The spleen is borderline enlarged. Adrenals/Urinary Tract: --Adrenal glands: No adrenal hemorrhage. --Right kidney/ureter: No hydronephrosis or perinephric hematoma. --Left kidney/ureter: No hydronephrosis or perinephric hematoma. --Urinary bladder: There is mild bladder wall thickening. Stomach/Bowel: --Stomach/Duodenum: No hiatal hernia or other gastric abnormality. Normal duodenal course and caliber. --Small bowel: No dilatation or inflammation. --Colon: Rectosigmoid diverticulosis without acute inflammation. --Appendix: Not visualized. No right lower quadrant inflammation or free fluid. Vascular/Lymphatic: Atherosclerotic calcification is present within the non-aneurysmal abdominal aorta, without hemodynamically significant stenosis. --No retroperitoneal lymphadenopathy. --No mesenteric lymphadenopathy. --No pelvic or inguinal lymphadenopathy. Reproductive: Unremarkable Other: No ascites or free air. The abdominal wall is normal. Musculoskeletal. Again noted is an L1 compression fracture. There are degenerative changes throughout the lumbar spine. There is no definite acute  displaced fracture identified on this exam. The patient is status post prior intramedullary nail placement for an intratrochanteric fracture of the right femur. There are old right-sided anterior rib fractures. IMPRESSION: 1. Findings suspicious for gallbladder inflammation. Correlation with dedicated ultrasound is recommended. 2. Rectosigmoid diverticulosis without acute diverticulitis. 3. Mild bladder wall thickening. Recommend correlation with urinalysis. 4. Cardiomegaly. 5. Aortic Atherosclerosis (ICD10-I70.0). Electronically Signed   By: Katherine Mantle M.D.   On: 12/13/2019 20:12   US Abdomen Limited RUQ  Result Date: 12/13/2019 CLINICAL DATA:  Transaminitis EXAM: ULTRASOUND ABDOMEN LIMITED RIGHT UPPER QUADRANT COMPARISON:  None. FINDINGS: Gallbladder: A negative sonographic Eulah Pont sign was reported by the sonographer. There is a single visible gallstone. No pericholecystic fluid or gallbladder wall thickening. Common bile duct: Diameter: 5 mm Liver: No focal lesion identified. Within normal limits in parenchymal echogenicity. Portal vein is patent on color Doppler imaging with normal direction of blood flow towards the liver. Other: None. IMPRESSION: Cholelithiasis without other evidence of acute cholecystitis. Electronically Signed   By: Deatra Robinson M.D.   On: 12/13/2019 18:55      Impression / Plan:   Assessment: Active Problems:   Hypertension   Chronic systolic CHF (congestive heart failure) (HCC)   Closed comminuted fracture of right hip (HCC)   Hypokalemia   PAF (paroxysmal atrial fibrillation) (HCC)   Transaminitis   Lactic acidosis   Danny Brady is a 86 y.o. y/o male with who comes in with abnormal liver enzymes and stones in the bladder without any choledocholithiasis or bile duct dilatation.  With this level of abnormal labs I would expect a dilated common bile duct.  The patient's liver enzymes have come down slightly while his bilirubin has gone up.  Bilirubin is the  last thing to come down typically with liver inflammation.  The patient has some lab sent off and are pending for possible cause of his abnormal liver enzymes.  Plan: I discussed the imaging and labs with the patient and his family.  The risk of ERCP at this point with pancreatitis necrosis and death outweighs the risk of observation.  The patient is not a candidate for an MRI due to hardware in his back.  If his liver enzymes do not improve or start to increase then repeat imaging with a CT or ultrasound to look for bile duct dilation.  I discussed the possibility of a liver biopsy if no other cause is found to look for possible obstructive patterns versus increased white cell such as eosinophils as a reactive hepatitis to some ingested substance.  This can even be idiosyncratic with medications he is taking.  The patient has been explained the plan and agrees with the plan.  Thank you for involving me in the care of this patient.      LOS: 1 day   Midge Minium, MD  12/14/2019, 2:43 PM Pager [phone] 7am-5pm  Check AMION for 5pm -7am coverage and on weekends   Note: This dictation was prepared with Dragon dictation along with smaller phrase technology. Any transcriptional errors that result from this process are unintentional.

## 2019-12-14 NOTE — Progress Notes (Signed)
PROGRESS NOTE  Danny Brady XBJ:[id number] DOB: 31-Dec-1926 DOA: 12/13/2019 PCP: Danella Penton, MD  Brief History   86 year old man recently hospitalized for hip fracture was seen by PCP for abdominal pain that began after eating chili, lab work was checked showing elevated enzymes and she was referred to the emergency department.  Patient reported vomiting, pain and son reported fever.  Of note patient has been taking Tylenol since hip surgery.  Right upper quadrant ultrasound showed cholelithiasis without complicating features.  CT did suggest possible cholecystitis.  Patient was started on acetylcysteine on recommendation of poison control.  Admitted for mixed elevation LFTs: Alkaline phosphatase, AST, ALT and total bilirubin.  A & P  Elevated LFTs, mixed pattern including hyperbilirubinemia, transaminitis and elevated alkaline phosphatase.  Right upper quadrant ultrasound showed cholelithiasis without evidence of acute cholecystitis.  CT abdomen pelvis concerning for gallbladder wall thickening.  Patient taking Tylenol at home for hip fracture. --Tylenol level was therapeutic, however pharmacy and poison control recommended acetylcysteine infusion given chronic congestion.  Salicylate level negative.  Patient reports taking 500 mg tablets at home no more than 6 a day.  Denies overuse.  Denies herbal supplements. --Etiology unclear.  Possible Tylenol injury, viral hepatitis seems less likely, since choledocholithiasis and cholangitis suggested by history, but not by imaging which revealed no evidence of CBD obstruction or dilatation.   --Follow-up hepatitis panel --Repeat Tylenol level this evening, coordinate further acetylcysteine per poison control. --MRCP not an option as patient has metal implants which he reports caused him to feel hot and abort an MRI in the past.  He has been told never to have an MRI again. --Discussed with gastroenterology, consultation pending --We will ask pharmacist  to review patient's other home medications  Hypotension on admission --Resolved.  Etiology unclear.  Chronic systolic CHF --Appears well compensated.  Atrial fibrillation, persistent, not on anticoagulation prior to hospitalization late January.  Apixaban not started on discharge secondary to postop bleed.  Decision regarding anticoagulation initiation deferred to the outpatient setting at that time. --Follow-up in the outpatient setting  Chart review --Discharge 2/1, status post right intramedullary nailing right hip fracture, ABLA  Disposition Plan:  From: Home Anticipated disposition: Home Discussion: Patient with undifferentiated illness causing significant elevation of LFTs, concern for Tylenol injury versus hepatitis versus obstruction.  LFTs remain elevated and patient is on acetylcysteine infusion and not a candidate for discharge at this time.  Project at least 2 days in the hospital assuming improvement tomorrow.  DVT prophylaxis: enoxaparin Code Status: Full Family Communication: daughter at bedside    Danny Sacks, MD  [facility] Direct contact: see [email or web address] (further directions at bottom of note if needed) 7PM-7AM contact night coverage as at bottom of note 12/14/2019, 8:38 AM  LOS: 1 day   Significant Hospital Events   . 2/19 admitted for elevated LFTs   Consults:  . Gastroenterology   Procedures:  .   Significant Diagnostic Tests:  . SARS-CoV-2 negative . CT abdomen pelvis 2/19 findings suspicious for gallbladder inflammation.  Diverticulosis.  Mild bladder wall thickening . 2/19 right upper quadrant ultrasound cholelithiasis without evidence of acute cholecystitis   Micro Data:  . 2/19 blood cultures no growth to date   Antimicrobials:  . Ceftriaxone 2/19 >  Interval History/Subjective  Feels much better today.  No abdominal pain.  No nausea or vomiting no complaints.  He reports he was taking 500 mg of Tylenol up to 6 tablets a day but  did not do over-the-counter  supplements or herbal supplements.  Objective   Vitals:  Vitals:   12/14/19 0549 12/14/19 0824  BP: 103/63 118/76  Pulse: 85 72  Resp:  18  Temp: 98.2 F (36.8 C) 98.3 F (36.8 C)  SpO2: 98% 99%    Exam:  Constitutional.  Appears calm, comfortable. Respiratory.  Clear to auscultation bilaterally.  No wheezes, rales or rhonchi.  Normal respiratory effort. Cardiovascular.  Regular rate and rhythm.  No murmur, rub or gallop.  No lower extremity edema. Abdomen.  Skin appears unremarkable.  Soft, nontender, nondistended. Psychiatric.  Grossly normal mood and affect.  Speech fluent and appropriate.  I have personally reviewed the following:   Today's Data  . BMP unremarkable.  The alkaline phosphatase, AST, ALT all decreased.  Total bilirubin slightly higher at 6.6. . WBC 22.8, new value, CBC was not checked yesterday . Hemoglobin 9.4 improved from previous value of February 1.  Platelets within normal limits. . Salicylate level negative.  Scheduled Meds: . enoxaparin (LOVENOX) injection  40 mg Subcutaneous Q24H  . finasteride  5 mg Oral Daily  . pantoprazole  40 mg Oral Daily   Continuous Infusions: . 0.9 % NaCl with KCl 40 mEq / L 125 mL/hr (12/13/19 2348)  . acetylcysteine 15 mg/kg/hr (12/14/19 0731)    Active Problems:   Hypertension   Chronic systolic CHF (congestive heart failure) (HCC)   Closed comminuted fracture of right hip (HCC)   Hypokalemia   PAF (paroxysmal atrial fibrillation) (HCC)   Transaminitis   Lactic acidosis   LOS: 1 day   How to contact the [facility] Attending or Consulting provider [place] or covering provider during after hours [place], for this patient?  1. Check the care team in [facility] and look for a) attending/consulting [facility] provider listed and b) the [facility] team listed 2. Log into [email or web address] and use [place]'s universal password to access. If you do not have the password, please contact the hospital  operator. 3. Locate the [facility] provider you are looking for under [facility] and page to a number that you can be directly reached. 4. If you still have difficulty reaching the provider, please page the [facility] (Director on Call) for the Hospitalists listed on amion for assistance.

## 2019-12-14 NOTE — Plan of Care (Signed)
  Problem: Education: Goal: Knowledge of General Education information will improve Description: Including pain rating scale, medication(s)/side effects and non-pharmacologic comfort measures 12/14/2019 0257 by Myles Gip, RN Outcome: Progressing 12/14/2019 0237 by Myles Gip, RN Outcome: Progressing   Problem: Safety: Goal: Ability to remain free from injury will improve 12/14/2019 0257 by Myles Gip, RN Outcome: Progressing 12/14/2019 0237 by Myles Gip, RN Outcome: Progressing

## 2019-12-14 NOTE — Progress Notes (Addendum)
MEDICATION RELATED CONSULT NOTE - INITIAL   Pharmacy Consult for  APAP overdose  Indication:   Allergies  Allergen Reactions  . Prednisone Other (See Comments)    Not able to sleep. Not able to sleep.   . Tamsulosin Other (See Comments)    Unknown insomnia     Patient Measurements: Height: 5\' 7"  (170.2 cm) Weight: 203 lb 12.8 oz (92.4 kg) IBW/kg (Calculated) : 66.1 Adjusted Body Weight:   Vital Signs: Temp: 98.7 F (37.1 C) (02/20 1933) Temp Source: Oral (02/20 1933) BP: 111/61 (02/20 1933) Pulse Rate: 97 (02/20 1933) Intake/Output from previous day: 02/19 0701 - 02/20 0700 In: 1005.3 [I.V.:905.3; IV Piggyback:100] Out: 350 [Urine:350] Intake/Output from this shift: Total I/O In: 50 [I.V.:50] Out: 850 [Urine:850]  Labs: Recent Labs    12/13/19 1747 12/14/19 0440 12/14/19 1948  WBC  --  22.8*  --   HGB  --  9.4*  --   HCT  --  29.3*  --   PLT  --  178  --   CREATININE 1.30* 1.01 0.86  ALBUMIN 2.8* 2.6* 2.5*  PROT 5.7* 5.8* 5.5*  AST 180* 111* 86*  ALT 98* 89* 86*  ALKPHOS 211* 182* 171*  BILITOT 5.4* 6.6* 4.6*   Estimated Creatinine Clearance: 61.9 mL/min (by C-G formula based on SCr of 0.86 mg/dL).   Microbiology: Recent Results (from the past 720 hour(s))  Respiratory Panel by RT PCR (Flu A&B, Covid) - Nasopharyngeal Swab     Status: None   Collection Time: 11/20/19  7:21 AM   Specimen: Nasopharyngeal Swab  Result Value Ref Range Status   SARS Coronavirus 2 by RT PCR NEGATIVE NEGATIVE Final    Comment: (NOTE) SARS-CoV-2 target nucleic acids are NOT DETECTED. The SARS-CoV-2 RNA is generally detectable in upper respiratoy specimens during the acute phase of infection. The lowest concentration of SARS-CoV-2 viral copies this assay can detect is 131 copies/mL. A negative result does not preclude SARS-Cov-2 infection and should not be used as the sole basis for treatment or other patient management decisions. A negative result may occur with   improper specimen collection/handling, submission of specimen other than nasopharyngeal swab, presence of viral mutation(s) within the areas targeted by this assay, and inadequate number of viral copies (<131 copies/mL). A negative result must be combined with clinical observations, patient history, and epidemiological information. The expected result is Negative. Fact Sheet for Patients:  PinkCheek.be Fact Sheet for Healthcare Providers:  GravelBags.it This test is not yet ap proved or cleared by the [place] FDA and  has been authorized for detection and/or diagnosis of SARS-CoV-2 by FDA under an Emergency Use Authorization (EUA). This EUA will remain  in effect (meaning this test can be used) for the duration of the COVID-19 declaration under Section 564(b)(1) of the Act, 21 U.S.C. section 360bbb-3(b)(1), unless the authorization is terminated or revoked sooner.    Influenza A by PCR NEGATIVE NEGATIVE Final   Influenza B by PCR NEGATIVE NEGATIVE Final    Comment: (NOTE) The Xpert Xpress SARS-CoV-2/FLU/RSV assay is intended as an aid in  the diagnosis of influenza from Nasopharyngeal swab specimens and  should not be used as a sole basis for treatment. Nasal washings and  aspirates are unacceptable for Xpert Xpress SARS-CoV-2/FLU/RSV  testing. Fact Sheet for Patients: PinkCheek.be Fact Sheet for Healthcare Providers: GravelBags.it This test is not yet approved or cleared by the [place] FDA and  has been authorized for detection and/or diagnosis of SARS-CoV-2 by  FDA under an Emergency Use Authorization (EUA). This EUA will remain  in effect (meaning this test can be used) for the duration of the  Covid-19 declaration under Section 564(b)(1) of the Act, 21  U.S.C. section 360bbb-3(b)(1), unless the authorization is  terminated or revoked. Performed at  [facility], [address] [place], [place] [address]   SARS CORONAVIRUS 2 (TAT 6-24 HRS) Nasopharyngeal Nasopharyngeal Swab     Status: None   Collection Time: 11/23/19 12:10 PM   Specimen: Nasopharyngeal Swab  Result Value Ref Range Status   SARS Coronavirus 2 NEGATIVE NEGATIVE Final    Comment: (NOTE) SARS-CoV-2 target nucleic acids are NOT DETECTED. The SARS-CoV-2 RNA is generally detectable in upper and lower respiratory specimens during the acute phase of infection. Negative results do not preclude SARS-CoV-2 infection, do not rule out co-infections with other pathogens, and should not be used as the sole basis for treatment or other patient management decisions. Negative results must be combined with clinical observations, patient history, and epidemiological information. The expected result is Negative. Fact Sheet for Patients: HairSlick.no Fact Sheet for Healthcare Providers: quierodirigir.com This test is not yet approved or cleared by the [place] FDA and  has been authorized for detection and/or diagnosis of SARS-CoV-2 by FDA under an Emergency Use Authorization (EUA). This EUA will remain  in effect (meaning this test can be used) for the duration of the COVID-19 declaration under Section 56 4(b)(1) of the Act, 21 U.S.C. section 360bbb-3(b)(1), unless the authorization is terminated or revoked sooner. Performed at [facility], [address], [place], [place] [address]   Blood culture (routine x 2)     Status: None (Preliminary result)   Collection Time: 12/13/19  7:32 PM   Specimen: BLOOD  Result Value Ref Range Status   Specimen Description BLOOD BLOOD LEFT HAND  Final   Special Requests   Final    BOTTLES DRAWN AEROBIC AND ANAEROBIC Blood Culture adequate volume   Culture   Final    NO GROWTH < 12 HOURS Performed at [facility], [address], [place], [place] [address]     Report Status PENDING  Incomplete  Blood culture (routine x 2)     Status: None (Preliminary result)   Collection Time: 12/13/19  7:32 PM   Specimen: BLOOD  Result Value Ref Range Status   Specimen Description BLOOD LEFT ANTECUBITAL  Final   Special Requests   Final    BOTTLES DRAWN AEROBIC AND ANAEROBIC Blood Culture adequate volume   Culture   Final    NO GROWTH < 12 HOURS Performed at [facility] [place], [address] [place], [place] [address]    Report Status PENDING  Incomplete  Respiratory Panel by RT PCR (Flu A&B, Covid) - Nasopharyngeal Swab     Status: None   Collection Time: 12/13/19  8:14 PM   Specimen: Nasopharyngeal Swab  Result Value Ref Range Status   SARS Coronavirus 2 by RT PCR NEGATIVE NEGATIVE Final    Comment: (NOTE) SARS-CoV-2 target nucleic acids are NOT DETECTED. The SARS-CoV-2 RNA is generally detectable in upper respiratoy specimens during the acute phase of infection. The lowest concentration of SARS-CoV-2 viral copies this assay can detect is 131 copies/mL. A negative result does not preclude SARS-Cov-2 infection and should not be used as the sole basis for treatment or other patient management decisions. A negative result may occur with  improper specimen collection/handling, submission of specimen other than nasopharyngeal swab, presence  of viral mutation(s) within the areas targeted by this assay, and inadequate number of viral copies (<131 copies/mL). A negative result must be combined with clinical observations, patient history, and epidemiological information. The expected result is Negative. Fact Sheet for Patients:  https://www.moore.com/ Fact Sheet for Healthcare Providers:  https://www.young.biz/ This test is not yet ap proved or cleared by the [place] FDA and  has been authorized for detection and/or diagnosis of SARS-CoV-2 by FDA under an Emergency Use Authorization (EUA). This EUA will  remain  in effect (meaning this test can be used) for the duration of the COVID-19 declaration under Section 564(b)(1) of the Act, 21 U.S.C. section 360bbb-3(b)(1), unless the authorization is terminated or revoked sooner.    Influenza A by PCR NEGATIVE NEGATIVE Final   Influenza B by PCR NEGATIVE NEGATIVE Final    Comment: (NOTE) The Xpert Xpress SARS-CoV-2/FLU/RSV assay is intended as an aid in  the diagnosis of influenza from Nasopharyngeal swab specimens and  should not be used as a sole basis for treatment. Nasal washings and  aspirates are unacceptable for Xpert Xpress SARS-CoV-2/FLU/RSV  testing. Fact Sheet for Patients: https://www.moore.com/ Fact Sheet for Healthcare Providers: https://www.young.biz/ This test is not yet approved or cleared by the [place] FDA and  has been authorized for detection and/or diagnosis of SARS-CoV-2 by  FDA under an Emergency Use Authorization (EUA). This EUA will remain  in effect (meaning this test can be used) for the duration of the  Covid-19 declaration under Section 564(b)(1) of the Act, 21  U.S.C. section 360bbb-3(b)(1), unless the authorization is  terminated or revoked. Performed at [facility], [address] [place], [place] [address]     Medical History: Past Medical History:  Diagnosis Date  . Edema   . Hypertension     Medications:  Medications Prior to Admission  Medication Sig Dispense Refill Last Dose  . acetaminophen (TYLENOL) 500 MG tablet Take 1 tablet (500 mg total) by mouth every 6 (six) hours as needed.  0 12/13/2019 at 0900  . aspirin EC 81 MG EC tablet Take 1 tablet (81 mg total) by mouth daily. 30 tablet 0 12/13/2019 at 0900  . ferrous sulfate 325 (65 FE) MG tablet Take 1 tablet (325 mg total) by mouth daily with breakfast.  3 12/13/2019 at 0900  . finasteride (PROSCAR) 5 MG tablet Take 5 mg by mouth daily.   12/13/2019 at 0900  . furosemide (LASIX) 20 MG  tablet Take 40 mg by mouth daily.   12/13/2019 at 0900  . omeprazole (PRILOSEC) 20 MG capsule Take 20 mg by mouth daily.   12/13/2019 at 0900  . spironolactone (ALDACTONE) 25 MG tablet Take 0.5 mg by mouth daily.   12/13/2019 at 0900  . methocarbamol (ROBAXIN) 500 MG tablet Take 1 tablet (500 mg total) by mouth every 8 (eight) hours as needed for muscle spasms.   unknown at prn  . ondansetron (ZOFRAN) 4 MG tablet Take 4 mg by mouth every 8 (eight) hours as needed for nausea/vomiting.     Marland Kitchen oxyCODONE-acetaminophen (PERCOCET/ROXICET) 5-325 MG tablet Take 1 tablet by mouth every 6 (six) hours as needed for moderate pain. 30 tablet 0 unknown at prn    Assessment: Pharmacy consulted to manage possible APAP overdose in this 86 year old male.  Pt was on ~ APAP 1 gm PO TID for past 3 weeks in addition to percocet 5/325 mg PO Q6H prn.   Pt was referred to ER by PCP due to  elevated LFTS on 2/19.    Labs 2/19:  APAP @ 1922 = 16                 AST @ 1747 = 180                ALT @ 1747 = 98    Labs 2/20@2000 : APAP<10         ALT: 86         AST: 86         Plan:  Contacted poison control regarding new levels. Per Merdis Delay, ok to DC Acetadote as Tylenol level is no longer elevated and  Abnormal liver function tests are most likely not associated with overdose. Messaged MD regarding information given by Poison Control, agreed it was ok to DC infusion.  Will continue to monitor patients liver function with AM labs.  Arilyn Brierley A Valeri Sula 12/14/2019,8:32 PM

## 2019-12-15 DIAGNOSIS — I4819 Other persistent atrial fibrillation: Secondary | ICD-10-CM

## 2019-12-15 DIAGNOSIS — R748 Abnormal levels of other serum enzymes: Secondary | ICD-10-CM

## 2019-12-15 DIAGNOSIS — D696 Thrombocytopenia, unspecified: Secondary | ICD-10-CM

## 2019-12-15 LAB — CBC
HCT: 26.4 % — ABNORMAL LOW (ref 39.0–52.0)
Hemoglobin: 8.6 g/dL — ABNORMAL LOW (ref 13.0–17.0)
MCH: 31.4 pg (ref 26.0–34.0)
MCHC: 32.6 g/dL (ref 30.0–36.0)
MCV: 96.4 fL (ref 80.0–100.0)
Platelets: 138 10*3/uL — ABNORMAL LOW (ref 150–400)
RBC: 2.74 MIL/uL — ABNORMAL LOW (ref 4.22–5.81)
RDW: 17.1 % — ABNORMAL HIGH (ref 11.5–15.5)
WBC: 11.4 10*3/uL — ABNORMAL HIGH (ref 4.0–10.5)
nRBC: 0 % (ref 0.0–0.2)

## 2019-12-15 LAB — COMPREHENSIVE METABOLIC PANEL
ALT: 72 U/L — ABNORMAL HIGH (ref 0–44)
AST: 60 U/L — ABNORMAL HIGH (ref 15–41)
Albumin: 2.3 g/dL — ABNORMAL LOW (ref 3.5–5.0)
Alkaline Phosphatase: 154 U/L — ABNORMAL HIGH (ref 38–126)
Anion gap: 12 (ref 5–15)
BUN: 23 mg/dL (ref 8–23)
CO2: 21 mmol/L — ABNORMAL LOW (ref 22–32)
Calcium: 8 mg/dL — ABNORMAL LOW (ref 8.9–10.3)
Chloride: 102 mmol/L (ref 98–111)
Creatinine, Ser: 0.83 mg/dL (ref 0.61–1.24)
GFR calc Af Amer: >60 mL/min (ref >60)
GFR calc non Af Amer: >60 mL/min (ref >60)
Glucose, Bld: 86 mg/dL (ref 70–99)
Potassium: 2.6 mmol/L — CL (ref 3.5–5.1)
Sodium: 135 mmol/L (ref 135–145)
Total Bilirubin: 3.7 mg/dL — ABNORMAL HIGH (ref 0.3–1.2)
Total Protein: 5 g/dL — ABNORMAL LOW (ref 6.5–8.1)

## 2019-12-15 LAB — PROTIME-INR
INR: 1.3 — ABNORMAL HIGH (ref 0.8–1.2)
Prothrombin Time: 16.3 seconds — ABNORMAL HIGH (ref 11.4–15.2)

## 2019-12-15 LAB — MAGNESIUM: Magnesium: 1.5 mg/dL — ABNORMAL LOW (ref 1.7–2.4)

## 2019-12-15 MED ORDER — SODIUM CHLORIDE 0.9 % IV SOLN
2.0000 g | INTRAVENOUS | Status: DC
  Filled 2019-12-15: qty 20

## 2019-12-15 MED ORDER — SPIRONOLACTONE 25 MG PO TABS
12.5000 mg | ORAL_TABLET | Freq: Every day | ORAL | Status: DC
  Administered 2019-12-15 – 2019-12-16 (×2): 12.5 mg via ORAL
  Filled 2019-12-15: qty 0.5
  Filled 2019-12-15 (×2): qty 1
  Filled 2019-12-15: qty 0.5

## 2019-12-15 MED ORDER — SODIUM CHLORIDE 0.9 % IV SOLN
2.0000 g | Freq: Once | INTRAVENOUS | Status: AC
  Administered 2019-12-15: 2 g via INTRAVENOUS
  Filled 2019-12-15: qty 20

## 2019-12-15 MED ORDER — SODIUM CHLORIDE 0.9 % IV SOLN: 1.0000 g | INTRAVENOUS | Status: DC

## 2019-12-15 MED ORDER — MAGNESIUM SULFATE 2 GM/50ML IV SOLN
2.0000 g | Freq: Once | INTRAVENOUS | Status: AC
  Administered 2019-12-15: 2 g via INTRAVENOUS
  Filled 2019-12-15: qty 50

## 2019-12-15 MED ORDER — FUROSEMIDE 40 MG PO TABS
40.0000 mg | ORAL_TABLET | Freq: Every day | ORAL | Status: DC
  Administered 2019-12-15 – 2019-12-16 (×2): 40 mg via ORAL
  Filled 2019-12-15 (×2): qty 1

## 2019-12-15 MED ORDER — POTASSIUM CHLORIDE 10 MEQ/100ML IV SOLN
10.0000 meq | INTRAVENOUS | Status: AC
  Administered 2019-12-15 (×6): 10 meq via INTRAVENOUS
  Filled 2019-12-15 (×6): qty 100

## 2019-12-15 MED ORDER — POTASSIUM CHLORIDE CRYS ER 20 MEQ PO TBCR
40.0000 meq | EXTENDED_RELEASE_TABLET | Freq: Once | ORAL | Status: AC
  Administered 2019-12-15: 40 meq via ORAL
  Filled 2019-12-15: qty 2

## 2019-12-15 NOTE — Progress Notes (Addendum)
PROGRESS NOTE  Danny Brady FIE:[id number] DOB: 02-Apr-1927 DOA: 12/13/2019 PCP: Danella Penton, MD  Brief History   86 year old man recently hospitalized for hip fracture was seen by PCP for abdominal pain that began after eating chili, lab work was checked showing elevated enzymes and she was referred to the emergency department.  Patient reported vomiting, pain and son reported fever.  Of note patient has been taking Tylenol since hip surgery.  Right upper quadrant ultrasound showed cholelithiasis without complicating features.  CT did suggest possible cholecystitis.  Patient was started on acetylcysteine on recommendation of poison control.  Admitted for mixed elevation LFTs: Alkaline phosphatase, AST, ALT and total bilirubin.  He symptomatically improved rapidly.  He was seen by gastroenterology.  Etiology of elevated LFTs remains obscure as detailed below.  He seems to be improving at this point.  If his condition continues to improve anticipate discharge in the next 48 hours.  A & P  Elevated LFTs, mixed pattern including hyperbilirubinemia, transaminitis and elevated alkaline phosphatase.  Right upper quadrant ultrasound showed cholelithiasis without evidence of acute cholecystitis.  CT abdomen pelvis concerning for gallbladder wall thickening.  Neither study showed any abnormalities of the CBD and there is no evidence of choledocholithiasis.  Patient taking Tylenol at home for hip fracture.. Tylenol level was therapeutic, however pharmacy and poison control recommended acetylcysteine infusion given chronic congestion.  Salicylate level negative.  Patient reports taking 500 mg tablets at home no more than 6 a day.  Denies overuse.  Denies herbal supplements.  Uncomfortable poison control 2/20 in the evening, acetylcysteine was discontinued given improvement in LFTs and uncertain diagnosis. MRCP not an option as patient has metal implants which he reports caused him to feel hot and abort an MRI in  the past.  He has been told never to have an MRI again. --Etiology remains unclear.  Possible Tylenol injury, viral hepatitis seems less likely. Choledocholithiasis and cholangitis suggested by history, but not by imaging which revealed no evidence of CBD obstruction or dilatation.  WBC was significantly elevated on admission but is now trending down.  We will continue empiric antibiotics for now. --Follow-up hepatitis panel, hepatitis C antibody was negative, hepatitis A antibody IgM negative.  Hepatitis B pending. --Pharmacy to review of medications revealed no likely offenders.  Hypokalemia, hypomagnesemia --Aggressively replete.  Check BMP, phosphorus and magnesium level in a.m.  Acute thrombocytopenia, etiology unclear --Check CBC in a.m.  Chronic systolic CHF --Chronic lower extremity edema, appears better than usual per son.  Resume furosemide and spironolactone.  PAF, not on anticoagulation prior to hospitalization late January.  Apixaban not started on discharge secondary to postop bleed.  Decision regarding anticoagulation initiation deferred to the outpatient setting at that time. --Follow-up in the outpatient setting  Hypotension on admission --Resolved.  Etiology unclear.  Chart review --Discharge 2/1, status post right intramedullary nailing right hip fracture, ABLA  Disposition Plan:  From: Home Anticipated disposition: Home Discussion: Symptomatically the patient is improving.  LFTs are trending down and he remains afebrile.  Now off acetylcysteine infusion.  Etiology remains obscure, still waiting on hepatitis B results.  GI following.  Given clinical improvement, if LFTs continue to trend down he may be able to discharge home in the next 48 hours.  I have restarted antibiotics as he did have significant leukocytosis on admission and I think it reasonable to treat with a short course as I think transient CBD stone and cholangitis is a possibility.  DVT prophylaxis:  enoxaparin Code  Status: Full Family Communication: Discussed in detail with son at bedside    Murray Hodgkins, MD  [facility] Direct contact: see [email or web address] (further directions at bottom of note if needed) 7PM-7AM contact night coverage as at bottom of note 12/15/2019, 1:08 PM  LOS: 2 days   Significant Hospital Events   . 2/19 admitted for elevated LFTs   Consults:  . Gastroenterology   Procedures:  .   Significant Diagnostic Tests:  . SARS-CoV-2 negative . CT abdomen pelvis 2/19 findings suspicious for gallbladder inflammation.  Diverticulosis.  Mild bladder wall thickening . 2/19 right upper quadrant ultrasound cholelithiasis without evidence of acute cholecystitis   Micro Data:  . 2/19 blood cultures no growth to date   Antimicrobials:  . Ceftriaxone 2/19 >  Interval History/Subjective  Acetylcysteine was discontinued last night and conference with poison control. Feels okay today.  No pain.  No vomiting.  Appetite not very good.  Objective   Vitals:  Vitals:   12/15/19 0821 12/15/19 1149  BP: (!) 111/56 117/67  Pulse: 89 82  Resp: 16 16  Temp: 98.3 F (36.8 C) 98.1 F (36.7 C)  SpO2: 98% 99%    Exam:  Constitutional.  Appears calm, comfortable. Respiratory.  Clear to auscultation bilaterally.  No wheezes, rales or rhonchi.  Normal respiratory effort. Cardiovascular.  Regular rate and rhythm.  No murmur, rub or gallop.  2+ right lower extremity edema greater than left lower extremity edema.  Chronic per patient. Skin.  Chronic skin changes bilateral lower extremities Musculoskeletal.  Significant bunion right foot.  I have personally reviewed the following:   Today's Data  . Potassium 2.6, magnesium 1.5 . AST trending down, ALT trending down, total bilirubin trending down . WBC 11.4, significant decrease from yesterday . Hemoglobin is slightly lower at 8.6 . Platelets lower at 138 . Repeat Tylenol level last evening was negative  Scheduled  Meds: . enoxaparin (LOVENOX) injection  40 mg Subcutaneous Q24H  . finasteride  5 mg Oral Daily  . furosemide  40 mg Oral Daily  . pantoprazole  40 mg Oral Daily  . potassium chloride  40 mEq Oral Once  . spironolactone  12.5 mg Oral Daily   Continuous Infusions: . [START ON 12/16/2019] cefTRIAXone (ROCEPHIN)  IV    . cefTRIAXone (ROCEPHIN)  IV    . potassium chloride 10 mEq (12/15/19 1210)    Principal Problem:   Elevated liver enzymes Active Problems:   Hypertension   Chronic systolic CHF (congestive heart failure) (HCC)   Closed comminuted fracture of right hip (HCC)   Hypokalemia   PAF (paroxysmal atrial fibrillation) (HCC)   Transaminitis   Lactic acidosis   Hypomagnesemia   Thrombocytopenia ([place])   LOS: 2 days   How to contact the [facility] Attending or Consulting provider [place] or covering provider during after hours [place], for this patient?  1. Check the care team in [facility] and look for a) attending/consulting [facility] provider listed and b) the [facility] team listed 2. Log into [email or web address] and use [place]'s universal password to access. If you do not have the password, please contact the hospital operator. 3. Locate the [facility] provider you are looking for under [facility] and page to a number that you can be directly reached. 4. If you still have difficulty reaching the provider, please page the [facility] (Director on Call) for the Hospitalists listed on amion for assistance.

## 2019-12-15 NOTE — Progress Notes (Signed)
PHARMACY NOTE:  ANTIMICROBIAL DOSAGE ADJUSTMENT  Current antimicrobial regimen includes a mismatch between antimicrobial dosage and indication.    Current antimicrobial dosage:  Ceftriaxone 1 g IV q24h  Indication: IAI per order    Antimicrobial dosage has been changed to:  Ceftriaxone 2 g IV q24h  Additional comments: per BPA alert    Thank you for allowing pharmacy to be a part of this patient's care.  Marty Heck, [facility] 12/15/2019 8:16 AM

## 2019-12-15 NOTE — Progress Notes (Addendum)
Pt potassium at 2.9. notify prime. Will continue to monitor.  Update 0450: Talked to [facility] states will wait for the morning labs before placing order, but added mag to pt labs. Will continue to monitor. Also mentioned about pt lactic NP Ouma states that since the lactic trend down then no need to recheck lactic. Will continue to monitor.  Update 0628: lab called pt potassium at 2.6. Notify prime. Will continue to monitor.  Update 0633: Anna Genre states will place order. Will continue to monitor.

## 2019-12-15 NOTE — Progress Notes (Signed)
PT Cancellation Note  Patient Details Name: Danny Brady MRN: [id number] DOB: 07-01-1927   Cancelled Treatment:    Reason Eval/Treat Not Completed: Medical issues which prohibited therapy;Other (comment)(Patient consult received and reviewed. Potassium currently outside of therapeutic range for evaluation. Will continue to monitor patient and attempt again as medically appropriate.)  Precious Bard, PT, DPT   12/15/2019, 8:36 AM

## 2019-12-15 NOTE — Progress Notes (Signed)
Danny Lame, MD [facility]   [address], [place], [place] [address] Phone: [phone] Fax : [phone]   Subjective: The patient has no issues this morning he states that he has no abdominal pain and is hungry.  The patient is with his son at the time of the interview today.  He tolerated breakfast well and is looking forward to lunch.  His liver enzymes are slowly coming down.   Objective: Vital signs in last 24 hours: Vitals:   12/14/19 1933 12/15/19 0455 12/15/19 0821 12/15/19 1149  BP: 111/61 (!) 113/54 (!) 111/56 117/67  Pulse: 97 60 89 82  Resp: 18 17 16 16   Temp: 98.7 F (37.1 C) 98.4 F (36.9 C) 98.3 F (36.8 C) 98.1 F (36.7 C)  TempSrc: Oral Oral Oral Oral  SpO2: 97% 97% 98% 99%  Weight:      Height:       Weight change:   Intake/Output Summary (Last 24 hours) at 12/15/2019 1150 Last data filed at 12/14/2019 2032 Gross per 24 hour  Intake 49.99 ml  Output 850 ml  Net -800.01 ml     Exam: Heart:: Regular rate and rhythm, S1S2 present, without murmur or extra heart sounds  Lungs: normal and clear to auscultation and percussion Abdomen: soft, nontender, normal bowel sounds   Lab Results: @LABTEST2 @ Micro Results: Recent Results (from the past 240 hour(s))  Blood culture (routine x 2)     Status: None (Preliminary result)   Collection Time: 12/13/19  7:32 PM   Specimen: BLOOD  Result Value Ref Range Status   Specimen Description BLOOD BLOOD LEFT HAND  Final   Special Requests   Final    BOTTLES DRAWN AEROBIC AND ANAEROBIC Blood Culture adequate volume   Culture   Final    NO GROWTH 2 DAYS Performed at [facility], [address] [place], [place] [address]    Report Status PENDING  Incomplete  Blood culture (routine x 2)     Status: None (Preliminary result)   Collection Time: 12/13/19  7:32 PM   Specimen: BLOOD  Result Value Ref Range Status   Specimen Description BLOOD LEFT ANTECUBITAL  Final   Special Requests   Final   BOTTLES DRAWN AEROBIC AND ANAEROBIC Blood Culture adequate volume   Culture   Final    NO GROWTH 2 DAYS Performed at [facility], [address], [place], [place] [address]    Report Status PENDING  Incomplete  Respiratory Panel by RT PCR (Flu A&B, Covid) - Nasopharyngeal Swab     Status: None   Collection Time: 12/13/19  8:14 PM   Specimen: Nasopharyngeal Swab  Result Value Ref Range Status   SARS Coronavirus 2 by RT PCR NEGATIVE NEGATIVE Final    Comment: (NOTE) SARS-CoV-2 target nucleic acids are NOT DETECTED. The SARS-CoV-2 RNA is generally detectable in upper respiratoy specimens during the acute phase of infection. The lowest concentration of SARS-CoV-2 viral copies this assay can detect is 131 copies/mL. A negative result does not preclude SARS-Cov-2 infection and should not be used as the sole basis for treatment or other patient management decisions. A negative result may occur with  improper specimen collection/handling, submission of specimen other than nasopharyngeal swab, presence of viral mutation(s) within the areas targeted by this assay, and inadequate number of viral copies (<131 copies/mL). A negative result must be combined with clinical observations, patient history, and epidemiological information. The expected result is Negative. Fact Sheet for Patients:  PinkCheek.be Fact  Sheet for Healthcare Providers:  https://www.young.biz/ This test is not yet ap proved or cleared by the [place] FDA and  has been authorized for detection and/or diagnosis of SARS-CoV-2 by FDA under an Emergency Use Authorization (EUA). This EUA will remain  in effect (meaning this test can be used) for the duration of the COVID-19 declaration under Section 564(b)(1) of the Act, 21 U.S.C. section 360bbb-3(b)(1), unless the authorization is terminated or revoked sooner.    Influenza A by PCR NEGATIVE NEGATIVE Final    Influenza B by PCR NEGATIVE NEGATIVE Final    Comment: (NOTE) The Xpert Xpress SARS-CoV-2/FLU/RSV assay is intended as an aid in  the diagnosis of influenza from Nasopharyngeal swab specimens and  should not be used as a sole basis for treatment. Nasal washings and  aspirates are unacceptable for Xpert Xpress SARS-CoV-2/FLU/RSV  testing. Fact Sheet for Patients: https://www.moore.com/ Fact Sheet for Healthcare Providers: https://www.young.biz/ This test is not yet approved or cleared by the [place] FDA and  has been authorized for detection and/or diagnosis of SARS-CoV-2 by  FDA under an Emergency Use Authorization (EUA). This EUA will remain  in effect (meaning this test can be used) for the duration of the  Covid-19 declaration under Section 564(b)(1) of the Act, 21  U.S.C. section 360bbb-3(b)(1), unless the authorization is  terminated or revoked. Performed at [facility], [address] [place], [place] [address]    Studies/Results: CT ABDOMEN PELVIS W CONTRAST  Result Date: 12/13/2019 CLINICAL DATA:  Generalized abdominal pain status post eating [place]. EXAM: CT ABDOMEN AND PELVIS WITH CONTRAST TECHNIQUE: Multidetector CT imaging of the abdomen and pelvis was performed using the standard protocol following bolus administration of intravenous contrast. CONTRAST:  OMNIPAQUE IOHEXOL 300 MG/ML  SOLN COMPARISON:  None. FINDINGS: Lower chest: The lung bases are clear. The heart is enlarged. Hepatobiliary: The liver is normal. There is suggestion of pericholecystic free fluid with mild gallbladder wall thickening.There is no biliary ductal dilation. Pancreas: Normal contours without ductal dilatation. No peripancreatic fluid collection. Spleen: The spleen is borderline enlarged. Adrenals/Urinary Tract: --Adrenal glands: No adrenal hemorrhage. --Right kidney/ureter: No hydronephrosis or perinephric hematoma. --Left kidney/ureter: No  hydronephrosis or perinephric hematoma. --Urinary bladder: There is mild bladder wall thickening. Stomach/Bowel: --Stomach/Duodenum: No hiatal hernia or other gastric abnormality. Normal duodenal course and caliber. --Small bowel: No dilatation or inflammation. --Colon: Rectosigmoid diverticulosis without acute inflammation. --Appendix: Not visualized. No right lower quadrant inflammation or free fluid. Vascular/Lymphatic: Atherosclerotic calcification is present within the non-aneurysmal abdominal aorta, without hemodynamically significant stenosis. --No retroperitoneal lymphadenopathy. --No mesenteric lymphadenopathy. --No pelvic or inguinal lymphadenopathy. Reproductive: Unremarkable Other: No ascites or free air. The abdominal wall is normal. Musculoskeletal. Again noted is an L1 compression fracture. There are degenerative changes throughout the lumbar spine. There is no definite acute displaced fracture identified on this exam. The patient is status post prior intramedullary nail placement for an intratrochanteric fracture of the right femur. There are old right-sided anterior rib fractures. IMPRESSION: 1. Findings suspicious for gallbladder inflammation. Correlation with dedicated ultrasound is recommended. 2. Rectosigmoid diverticulosis without acute diverticulitis. 3. Mild bladder wall thickening. Recommend correlation with urinalysis. 4. Cardiomegaly. 5. Aortic Atherosclerosis (ICD10-I70.0). Electronically Signed   By: Katherine Mantle M.D.   On: 12/13/2019 20:12   US Abdomen Limited RUQ  Result Date: 12/13/2019 CLINICAL DATA:  Transaminitis EXAM: ULTRASOUND ABDOMEN LIMITED RIGHT UPPER QUADRANT COMPARISON:  None. FINDINGS: Gallbladder: A negative sonographic Eulah Pont sign was reported by the sonographer. There is a single  visible gallstone. No pericholecystic fluid or gallbladder wall thickening. Common bile duct: Diameter: 5 mm Liver: No focal lesion identified. Within normal limits in parenchymal  echogenicity. Portal vein is patent on color Doppler imaging with normal direction of blood flow towards the liver. Other: None. IMPRESSION: Cholelithiasis without other evidence of acute cholecystitis. Electronically Signed   By: Deatra Robinson M.D.   On: 12/13/2019 18:55   Medications: I have reviewed the patient's current medications. Scheduled Meds: . enoxaparin (LOVENOX) injection  40 mg Subcutaneous Q24H  . finasteride  5 mg Oral Daily  . pantoprazole  40 mg Oral Daily  . potassium chloride  40 mEq Oral Once   Continuous Infusions: . [START ON 12/16/2019] cefTRIAXone (ROCEPHIN)  IV    . cefTRIAXone (ROCEPHIN)  IV    . potassium chloride 10 mEq (12/15/19 1050)   PRN Meds:.methocarbamol, oxyCODONE, traZODone   Assessment: Active Problems:   Hypertension   Chronic systolic CHF (congestive heart failure) (HCC)   Closed comminuted fracture of right hip (HCC)   Hypokalemia   PAF (paroxysmal atrial fibrillation) (HCC)   Transaminitis   Lactic acidosis    Plan: This patient came in with abnormal liver enzymes.  The patient's liver enzymes are improving.  The cause is undetermined although he was being treated for possible acetaminophen toxicity.  The patient was noted to have a low potassium and is being treated for that.  There was no bile duct dilatation seen on imaging.  His white cell count is also improving.  I would continue conservative management and Dr. Allegra Lai will be taking over the service tomorrow and will follow up with patient.   LOS: 2 days   Midge Minium 12/15/2019, 11:50 AM Pager [phone] 7am-5pm  Check AMION for 5pm -7am coverage and on weekends

## 2019-12-16 DIAGNOSIS — R7401 Elevation of levels of liver transaminase levels: Secondary | ICD-10-CM

## 2019-12-16 LAB — COMPREHENSIVE METABOLIC PANEL
ALT: 53 U/L — ABNORMAL HIGH (ref 0–44)
AST: 30 U/L (ref 15–41)
Albumin: 2.3 g/dL — ABNORMAL LOW (ref 3.5–5.0)
Alkaline Phosphatase: 154 U/L — ABNORMAL HIGH (ref 38–126)
Anion gap: 7 (ref 5–15)
BUN: 23 mg/dL (ref 8–23)
CO2: 26 mmol/L (ref 22–32)
Calcium: 8.1 mg/dL — ABNORMAL LOW (ref 8.9–10.3)
Chloride: 103 mmol/L (ref 98–111)
Creatinine, Ser: 0.83 mg/dL (ref 0.61–1.24)
GFR calc Af Amer: >60 mL/min (ref >60)
GFR calc non Af Amer: >60 mL/min (ref >60)
Glucose, Bld: 95 mg/dL (ref 70–99)
Potassium: 3.3 mmol/L — ABNORMAL LOW (ref 3.5–5.1)
Sodium: 136 mmol/L (ref 135–145)
Total Bilirubin: 2.2 mg/dL — ABNORMAL HIGH (ref 0.3–1.2)
Total Protein: 5.1 g/dL — ABNORMAL LOW (ref 6.5–8.1)

## 2019-12-16 LAB — CBC
HCT: 27.9 % — ABNORMAL LOW (ref 39.0–52.0)
Hemoglobin: 8.9 g/dL — ABNORMAL LOW (ref 13.0–17.0)
MCH: 31.3 pg (ref 26.0–34.0)
MCHC: 31.9 g/dL (ref 30.0–36.0)
MCV: 98.2 fL (ref 80.0–100.0)
Platelets: 118 10*3/uL — ABNORMAL LOW (ref 150–400)
RBC: 2.84 MIL/uL — ABNORMAL LOW (ref 4.22–5.81)
RDW: 16.9 % — ABNORMAL HIGH (ref 11.5–15.5)
WBC: 8.6 10*3/uL (ref 4.0–10.5)
nRBC: 0 % (ref 0.0–0.2)

## 2019-12-16 LAB — HEPATITIS PANEL, ACUTE
HCV Ab: <0.1 s/co ratio — AB (ref 0.0–0.9)
Hep A IgM: NEGATIVE — AB
Hep B C IgM: NEGATIVE — AB
Hepatitis B Surface Ag: NEGATIVE — AB

## 2019-12-16 LAB — PROTIME-INR
INR: 1 (ref 0.8–1.2)
Prothrombin Time: 13.5 seconds (ref 11.4–15.2)

## 2019-12-16 LAB — PHOSPHORUS
Phosphorus: 2.2 mg/dL — ABNORMAL LOW (ref 2.5–4.6)
Phosphorus: 2.8 mg/dL (ref 2.5–4.6)

## 2019-12-16 LAB — MAGNESIUM
Magnesium: 1.6 mg/dL — ABNORMAL LOW (ref 1.7–2.4)
Magnesium: 1.9 mg/dL (ref 1.7–2.4)

## 2019-12-16 LAB — POTASSIUM: Potassium: 4.3 mmol/L (ref 3.5–5.1)

## 2019-12-16 MED ORDER — FUROSEMIDE 20 MG PO TABS: 20.0000 mg | ORAL_TABLET | Freq: Every day | ORAL | 0 refills | Status: AC

## 2019-12-16 MED ORDER — ASPIRIN EC 81 MG PO TBEC
81.0000 mg | DELAYED_RELEASE_TABLET | Freq: Every day | ORAL | Status: DC
  Administered 2019-12-16: 81 mg via ORAL
  Filled 2019-12-16: qty 1

## 2019-12-16 MED ORDER — POTASSIUM CHLORIDE CRYS ER 20 MEQ PO TBCR: 20.0000 meq | EXTENDED_RELEASE_TABLET | Freq: Every day | ORAL | Status: DC

## 2019-12-16 MED ORDER — K PHOS MONO-SOD PHOS DI & MONO 155-852-130 MG PO TABS
500.0000 mg | ORAL_TABLET | ORAL | Status: AC
  Administered 2019-12-16 (×2): 500 mg via ORAL
  Filled 2019-12-16 (×2): qty 2

## 2019-12-16 MED ORDER — DOXYCYCLINE HYCLATE 100 MG PO TABS: 100.0000 mg | ORAL_TABLET | Freq: Two times a day (BID) | ORAL | 0 refills | Status: AC

## 2019-12-16 MED ORDER — POTASSIUM CHLORIDE CRYS ER 10 MEQ PO TBCR: 10.0000 meq | EXTENDED_RELEASE_TABLET | Freq: Every day | ORAL | 1 refills | Status: DC

## 2019-12-16 MED ORDER — POTASSIUM CHLORIDE CRYS ER 20 MEQ PO TBCR: 20.0000 meq | EXTENDED_RELEASE_TABLET | Freq: Once | ORAL | Status: DC

## 2019-12-16 MED ORDER — SODIUM CHLORIDE 0.9 % IV SOLN
INTRAVENOUS | Status: DC | PRN
  Administered 2019-12-16: 250 mL via INTRAVENOUS

## 2019-12-16 MED ORDER — MAGNESIUM OXIDE 400 (241.3 MG) MG PO TABS: 400.0000 mg | ORAL_TABLET | Freq: Every day | ORAL | Status: DC

## 2019-12-16 MED ORDER — POTASSIUM CHLORIDE CRYS ER 20 MEQ PO TBCR
40.0000 meq | EXTENDED_RELEASE_TABLET | Freq: Once | ORAL | Status: AC
  Administered 2019-12-16: 40 meq via ORAL
  Filled 2019-12-16: qty 2

## 2019-12-16 MED ORDER — DOXYCYCLINE HYCLATE 100 MG PO TABS
100.0000 mg | ORAL_TABLET | Freq: Two times a day (BID) | ORAL | Status: DC
  Administered 2019-12-16: 100 mg via ORAL
  Filled 2019-12-16: qty 1

## 2019-12-16 MED ORDER — MAGNESIUM SULFATE 2 GM/50ML IV SOLN
2.0000 g | Freq: Once | INTRAVENOUS | Status: AC
  Administered 2019-12-16: 2 g via INTRAVENOUS
  Filled 2019-12-16: qty 50

## 2019-12-16 MED ORDER — ACETAMINOPHEN 500 MG PO TABS: 500.0000 mg | ORAL_TABLET | Freq: Three times a day (TID) | ORAL | 0 refills | Status: AC | PRN

## 2019-12-16 MED ORDER — MAGNESIUM OXIDE 400 (241.3 MG) MG PO TABS: 400.0000 mg | ORAL_TABLET | Freq: Every day | ORAL | 0 refills | Status: DC

## 2019-12-16 NOTE — Progress Notes (Signed)
Patient ID: Danny Brady, male   DOB: 1927/03/04, 86 y.o.   MRN: [id number] patient's magnesium is 1.9, potassium is 4.3 and phosphorus is 2.8 after replacement. Will add PO medium oxide and PO potassium for home. Patient can discharge home thereafter.

## 2019-12-16 NOTE — TOC Initial Note (Signed)
Transition of Care [facility]) - Initial/Assessment Note    Patient Details  Name: Danny Brady MRN: [id number] Date of Birth: June 25, 1927  Transition of Care [facility]) CM/SW Contact:    Trenton Founds, RN Phone Number: 12/16/2019, 4:38 PM  Clinical Narrative:        RNCM spoke with patient's son by phone per his request. Patient is discharging back to home after coming in for hypotension, he is cared for at home by his family and [facility] home health. He was recently here and went to SNF after hip fracture. Spoke with son Danny Brady who confirmed patient was open to [facility] and further that he has all needed equipment in the home. Danny Brady is planning on coming up to hospital shortly to pick patient up.  RNCM placed call to Brittney with [facility] and confirmed patient is open to them and will be discharging today with orders for SN, PT, OT.            Expected Discharge Plan: Home w Home Health Services Barriers to Discharge: No Barriers Identified   Patient Goals and CMS Choice        Expected Discharge Plan and Services Expected Discharge Plan: Home w Home Health Services   Discharge Planning Services: CM Consult Post Acute Care Choice: Home Health                             HH Arranged: RN, PT, OT [facility] Agency: [facility] Date [facility] Agency Contacted: 12/16/19 Time HH Agency Contacted: [phone] Representative spoke with at [facility] Agency: Brittney  Prior Living Arrangements/Services   Lives with:: Adult Children   Do you feel safe going back to the place where you live?: Yes      Need for Family Participation in Patient Care: Yes (Comment) Care giver support system in place?: Yes (comment) Current home services: Home OT, Home PT Criminal Activity/Legal Involvement Pertinent to Current Situation/Hospitalization: No - Comment as needed  Activities of Daily Living Home Assistive Devices/Equipment: Raised toilet seat with rails, Shower chair with back, Walker (specify type), Cane (specify  quad or straight)(4 wheels) ADL Screening (condition at time of admission) Patient's cognitive ability adequate to safely complete daily activities?: Yes Is the patient deaf or have difficulty hearing?: Yes Does the patient have difficulty seeing, even when wearing glasses/contacts?: No Does the patient have difficulty concentrating, remembering, or making decisions?: No Patient able to express need for assistance with ADLs?: Yes Does the patient have difficulty dressing or bathing?: No Independently performs ADLs?: No Communication: Independent Dressing (OT): Needs assistance Is this a change from baseline?: Pre-admission baseline Grooming: Needs assistance Is this a change from baseline?: Pre-admission baseline Feeding: Independent Bathing: Needs assistance Is this a change from baseline?: Pre-admission baseline Toileting: Needs assistance Is this a change from baseline?: Pre-admission baseline In/Out Bed: Needs assistance Does the patient have difficulty walking or climbing stairs?: Yes Weakness of Legs: Right Weakness of Arms/Hands: None  Permission Sought/Granted                  Emotional Assessment         Alcohol / Substance Use: Not Applicable Psych Involvement: No (comment)  Admission diagnosis:  Transaminitis [R74.01] Elevated lactic acid level [R79.89] Hypotension, unspecified hypotension type [I95.9] Patient Active Problem List   Diagnosis Date Noted  . Hypomagnesemia 12/15/2019  . Thrombocytopenia (HCC) 12/15/2019  . Elevated liver enzymes   . Transaminitis 12/13/2019  . Lactic acidosis  12/13/2019  . Closed right hip fracture ([place]) 11/20/2019  . Hypertension   . Chronic systolic CHF (congestive heart failure) ([place])   . GERD (gastroesophageal reflux disease)   . Fall   . Closed comminuted fracture of right hip ([place])   . Hypokalemia   . PAF (paroxysmal atrial fibrillation) ([place])    PCP:  Rusty Aus, MD Pharmacy:   [place], [place] - [address] [place] [address] Phone: [phone] Fax: [phone]     Social Determinants of Health (SDOH) Interventions    Readmission Risk Interventions No flowsheet data found.

## 2019-12-16 NOTE — Progress Notes (Signed)
Patient discharged home. Discharge instructions given with son at bedside. Patient and son verbalized understanding without any questions or concerns. IV's taken out.

## 2019-12-16 NOTE — Plan of Care (Signed)

## 2019-12-16 NOTE — Discharge Summary (Addendum)
Danny Brady    MR#:  [id number]  DATE OF BIRTH:  Dec 13, 1926  DATE OF ADMISSION:  12/13/2019 ADMITTING PHYSICIAN: No admitting provider for patient encounter.  DATE OF DISCHARGE: 12/16/2019  PRIMARY CARE PHYSICIAN: Rusty Aus, MD    ADMISSION DIAGNOSIS:  Transaminitis [R74.01] Elevated lactic acid level [R79.89] Hypotension, unspecified hypotension type [I95.9]  DISCHARGE DIAGNOSIS:  Transaminitis--unclear etiology--treated for Possible mild hepatic congestion from recentTylenol vs mild cholangitis--improved  SECONDARY DIAGNOSIS:   Past Medical History:  Diagnosis Date  . Edema   . Hypertension     HOSPITAL COURSE:  86 year old man recently hospitalized for hip fracture was seen by PCP for abdominal pain that began after eating chili, lab work was checked showing elevated enzymes and she was referred to the emergency department.  Patient reported vomiting, pain and son reported fever  Elevated LFTs, mixed pattern including hyperbilirubinemia, transaminitis and elevated alkaline phosphatase. -  Right upper quadrant ultrasound showed cholelithiasis (chornic) without evidence of acute cholecystitis.   -CT abdomen pelvis concerning for gallbladder wall thickening.  Neither study showed any abnormalities of the CBD and there is no evidence of choledocholithiasis.   -Patient taking Tylenol at home for hip fracture.. Tylenol level was therapeutic, however pharmacy and poison control recommended acetylcysteine infusion given chronic congestion. -  Salicylate level negative.  Patient reports taking 500 mg tablets at home no more than 6 a day--change to tid prn - Denies herbal supplements.   - MRCP not an option as patient has metal implants which he reports caused him to feel hot and abort an MRI in the past.  --Etiology remains unclear.  Possible Tylenol injury, viral hepatitis seems less likely.  Choledocholithiasis and cholangitis suggested by history, but not by imaging which revealed no evidence of CBD obstruction or dilatation.  -WBC was significantly elevated on admission but is now trending down.  We will continue empiric antibiotics --change to po doxycycline --Follow-up hepatitis panel, hepatitis C antibody was negative, hepatitis A antibody IgM negative.  Hepatitis B surface ag negative  --Pharmacy  review of medications revealed no likely offenders.  Hypokalemia, hypomagnesemia --Aggressively replete.  Check BMP, phosphorus and magnesium after IV /po replaced today prior to d/c  Acute thrombocytopenia, etiology unclear --d/ced lovenox and rocephin --Dr Sabra Heck will check cbc as out pt (sent message)  Chronic systolic CHF --Chronic lower extremity edema, appears better than usual per son.  Resume furosemide (decreased to 20 mg qd)  and spironolactone.  PAF, not on anticoagulation prior to hospitalization late January.Apixaban not started on discharge secondary to postop bleed.  - Decision regarding anticoagulation initiation deferred to the outpatient setting at that time. --Follow-up in the outpatient setting with dr Sabra Heck  Hypotension on admission --Resolved.  Etiology unclear.  Recent hip surgery --Discharge 2/1, status post right intramedullary nailing right hip fracture -resume HHPT  Disposition Plan:  From: Home Anticipated disposition: Home today after reviewing labs DVT prophylaxis: SCD Code Status: Full Family Communication: Discussed in detail with son Sohrab Keelan on the phone   CONSULTS OBTAINED:  Treatment Team:  Lucilla Lame, MD  DRUG ALLERGIES:   Allergies  Allergen Reactions  . Prednisone Other (See Comments)    Not able to sleep. Not able to sleep.   . Tamsulosin Other (See Comments)    Unknown insomnia     DISCHARGE MEDICATIONS:   Allergies as of 12/16/2019      Reactions   Prednisone Other (See  Comments)   Not able to  sleep. Not able to sleep.   Tamsulosin Other (See Comments)   Unknown insomnia      Medication List    STOP taking these medications   methocarbamol 500 MG tablet Commonly known as: ROBAXIN   oxyCODONE-acetaminophen 5-325 MG tablet Commonly known as: PERCOCET/ROXICET     TAKE these medications   acetaminophen 500 MG tablet Commonly known as: TYLENOL Take 1 tablet (500 mg total) by mouth every 8 (eight) hours as needed. What changed: when to take this   aspirin 81 MG EC tablet Take 1 tablet (81 mg total) by mouth daily.   doxycycline 100 MG tablet Commonly known as: VIBRA-TABS Take 1 tablet (100 mg total) by mouth every 12 (twelve) hours for 3 days.   ferrous sulfate 325 (65 FE) MG tablet Take 1 tablet (325 mg total) by mouth daily with breakfast.   finasteride 5 MG tablet Commonly known as: PROSCAR Take 5 mg by mouth daily.   furosemide 20 MG tablet Commonly known as: LASIX Take 1 tablet (20 mg total) by mouth daily. What changed: how much to take   magnesium oxide 400 (241.3 Mg) MG tablet Commonly known as: MAG-OX Take 1 tablet (400 mg total) by mouth daily.   omeprazole 20 MG capsule Commonly known as: PRILOSEC Take 20 mg by mouth daily.   ondansetron 4 MG tablet Commonly known as: ZOFRAN Take 4 mg by mouth every 8 (eight) hours as needed for nausea/vomiting.   potassium chloride 10 MEQ tablet Commonly known as: KLOR-CON Take 1 tablet (10 mEq total) by mouth daily.   spironolactone 25 MG tablet Commonly known as: ALDACTONE Take 0.5 mg by mouth daily.       If you experience worsening of your admission symptoms, develop shortness of breath, life threatening emergency, suicidal or homicidal thoughts you must seek medical attention immediately by calling 911 or calling your MD immediately  if symptoms less severe.  You Must read complete instructions/literature along with all the possible adverse reactions/side effects for all the Medicines you take  and that have been prescribed to you. Take any new Medicines after you have completely understood and accept all the possible adverse reactions/side effects.   Please note  You were cared for by a hospitalist during your hospital stay. If you have any questions about your discharge medications or the care you received while you were in the hospital after you are discharged, you can call the unit and asked to speak with the hospitalist on call if the hospitalist that took care of you is not available. Once you are discharged, your primary care physician will handle any further medical issues. Please note that NO REFILLS for any discharge medications will be authorized once you are discharged, as it is imperative that you return to your primary care physician (or establish a relationship with a primary care physician if you do not have one) for your aftercare needs so that they can reassess your need for medications and monitor your lab values. Today   SUBJECTIVE   Overall feels ok. Wondering if he will get to go home  VITAL SIGNS:  Blood pressure (!) 95/58, pulse 81, temperature 97.8 F (36.6 C), temperature source Oral, resp. rate 18, height 5\' 7"  (1.702 m), weight 92.4 kg, SpO2 100 %.  I/O:    Intake/Output Summary (Last 24 hours) at 12/16/2019 1741 Last data filed at 12/16/2019 1315 Gross per 24 hour  Intake 360 ml  Output --  Net 360 ml    PHYSICAL EXAMINATION:  GENERAL:  86 y.o.-year-old patient lying in the bed with no acute distress.  EYES: Pupils equal, round, reactive to light and accommodation. No scleral icterus.  HEENT: Head atraumatic, normocephalic. Oropharynx and nasopharynx clear.  NECK:  Supple, no jugular venous distention. No thyroid enlargement, no tenderness.  LUNGS: Normal breath sounds bilaterally, no wheezing, rales,rhonchi or crepitation. No use of accessory muscles of respiration.  CARDIOVASCULAR: S1, S2 normal. No murmurs, rubs, or gallops.  ABDOMEN: Soft,  non-tender, non-distended. Bowel sounds present. No organomegaly or mass.  EXTREMITIES: + pedal edema,no  cyanosis, or clubbing.  NEUROLOGIC: Cranial nerves II through XII are intact. Muscle strength 5/5 in all extremities. Sensation intact. Gait not checked.  PSYCHIATRIC: patient is alert and oriented x 3.  SKIN: chronic bilateral LE skin changes  DATA REVIEW:   CBC  Recent Labs  Lab 12/16/19 0455  WBC 8.6  HGB 8.9*  HCT 27.9*  PLT 118*    Chemistries  Recent Labs  Lab 12/16/19 0455 12/16/19 0455 12/16/19 1657  NA 136  --   --   K 3.3*   < > 4.3  CL 103  --   --   CO2 26  --   --   GLUCOSE 95  --   --   BUN 23  --   --   CREATININE 0.83  --   --   CALCIUM 8.1*  --   --   MG 1.6*   < > 1.9  AST 30  --   --   ALT 53*  --   --   ALKPHOS 154*  --   --   BILITOT 2.2*  --   --    < > = values in this interval not displayed.    Microbiology Results   Recent Results (from the past 240 hour(s))  Blood culture (routine x 2)     Status: None (Preliminary result)   Collection Time: 12/13/19  7:32 PM   Specimen: BLOOD  Result Value Ref Range Status   Specimen Description BLOOD BLOOD LEFT HAND  Final   Special Requests   Final    BOTTLES DRAWN AEROBIC AND ANAEROBIC Blood Culture adequate volume   Culture   Final    NO GROWTH 3 DAYS Performed at [facility], [address], [place], [place] [address]    Report Status PENDING  Incomplete  Blood culture (routine x 2)     Status: None (Preliminary result)   Collection Time: 12/13/19  7:32 PM   Specimen: BLOOD  Result Value Ref Range Status   Specimen Description BLOOD LEFT ANTECUBITAL  Final   Special Requests   Final    BOTTLES DRAWN AEROBIC AND ANAEROBIC Blood Culture adequate volume   Culture   Final    NO GROWTH 3 DAYS Performed at [facility], [address], [place], [place] [address]    Report Status PENDING  Incomplete  Respiratory Panel by RT PCR (Flu A&B, Covid) - Nasopharyngeal Swab      Status: None   Collection Time: 12/13/19  8:14 PM   Specimen: Nasopharyngeal Swab  Result Value Ref Range Status   SARS Coronavirus 2 by RT PCR NEGATIVE NEGATIVE Final    Comment: (NOTE) SARS-CoV-2 target nucleic acids are NOT DETECTED. The SARS-CoV-2 RNA is generally detectable in upper respiratoy specimens during the acute phase of infection. The lowest concentration of SARS-CoV-2 viral copies this assay can detect is 131  copies/mL. A negative result does not preclude SARS-Cov-2 infection and should not be used as the sole basis for treatment or other patient management decisions. A negative result may occur with  improper specimen collection/handling, submission of specimen other than nasopharyngeal swab, presence of viral mutation(s) within the areas targeted by this assay, and inadequate number of viral copies (<131 copies/mL). A negative result must be combined with clinical observations, patient history, and epidemiological information. The expected result is Negative. Fact Sheet for Patients:  https://www.moore.com/ Fact Sheet for Healthcare Providers:  https://www.young.biz/ This test is not yet ap proved or cleared by the [place] FDA and  has been authorized for detection and/or diagnosis of SARS-CoV-2 by FDA under an Emergency Use Authorization (EUA). This EUA will remain  in effect (meaning this test can be used) for the duration of the COVID-19 declaration under Section 564(b)(1) of the Act, 21 U.S.C. section 360bbb-3(b)(1), unless the authorization is terminated or revoked sooner.    Influenza A by PCR NEGATIVE NEGATIVE Final   Influenza B by PCR NEGATIVE NEGATIVE Final    Comment: (NOTE) The Xpert Xpress SARS-CoV-2/FLU/RSV assay is intended as an aid in  the diagnosis of influenza from Nasopharyngeal swab specimens and  should not be used as a sole basis for treatment. Nasal washings and  aspirates are unacceptable for  Xpert Xpress SARS-CoV-2/FLU/RSV  testing. Fact Sheet for Patients: https://www.moore.com/ Fact Sheet for Healthcare Providers: https://www.young.biz/ This test is not yet approved or cleared by the [place] FDA and  has been authorized for detection and/or diagnosis of SARS-CoV-2 by  FDA under an Emergency Use Authorization (EUA). This EUA will remain  in effect (meaning this test can be used) for the duration of the  Covid-19 declaration under Section 564(b)(1) of the Act, 21  U.S.C. section 360bbb-3(b)(1), unless the authorization is  terminated or revoked. Performed at [facility], [address] [place], [place] [address]     RADIOLOGY:  No results found.   CODE STATUS:     Code Status Orders  (From admission, onward)         Start     Ordered   12/13/19 2255  Full code  Continuous     12/13/19 2254        Code Status History    Date Active Date Inactive Code Status Order ID Comments User Context   11/20/2019 1025 11/25/2019 2337 Full Code [id number]  Lorretta Harp, MD ED   Advance Care Planning Activity    Advance Directive Documentation     Most Recent Value  Type of Advance Directive  Living will  Pre-existing out of facility DNR order (yellow form or pink MOST form)  --  "MOST" Form in Place?  --       TOTAL TIME TAKING CARE OF THIS PATIENT: *40* minutes.    Enedina Finner M.D  [facility]    CC: Primary care physician; Danella Penton, MD

## 2019-12-16 NOTE — Evaluation (Signed)
Physical Therapy Evaluation Patient Details Name: Danny Brady MRN: [id number] DOB: 11-09-1926 Today's Date: 12/16/2019   History of Present Illness  Pt is a 86 y.o. male presenting to hospital 12/13/19 with elevated liver enzymes; also with abdominal pain and N/V.  Pt admitted with elevated LFT's; mixed pattern including hyperbilirubinemia, transaminitis and elevated alkaline phosphatase; hypokalemia; hypomagnesemia; acute thrombocytopenia; chronic systolic CHF; PAF; hypotension on admission.  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  Clinical Impression  Prior to hospital admission, pt was ambulatory household distances with RW; lives alone but pt reports since discharge from STR he has had 24/7 assist from his 3 children.  Currently pt is mod to max assist semi-supine to sitting edge of bed; mod to max assist to stand up to walker; and min assist to walk 30 feet with RW (limited distance d/t fatigue; chair follow provided with 2nd assist present for safety).  Antalgic gait, decreased stance time R LE, with R knee mildly flexing during R LE stance phase (when advancing L LE) noted.  Pt would benefit from skilled PT to address noted impairments and functional limitations (see below for any additional details).  Upon hospital discharge, pt would benefit from STR but pt reports recent experience at [facility] and is requesting to discharge home instead with resumption of HHPT services.    Follow Up Recommendations SNF(pt requesting discharge home instead)    Equipment Recommendations  Rolling walker with 5" wheels;3in1 (PT);Wheelchair (measurements PT);Wheelchair cushion (measurements PT)    Recommendations for Other Services OT consult     Precautions / Restrictions Precautions Precautions: Fall Restrictions Weight Bearing Restrictions: Yes RLE Weight Bearing: Weight  bearing as tolerated      Mobility  Bed Mobility Overal bed mobility: Needs Assistance Bed Mobility: Supine to Sit     Supine to sit: Mod assist;Max assist;HOB elevated     General bed mobility comments: assist for trunk and B LE's semi-supine to sitting edge of bed; use of bed rail; vc's for technique  Transfers Overall transfer level: Needs assistance Equipment used: Rolling walker (2 wheeled) Transfers: Sit to/from Stand Sit to Stand: From elevated surface;Mod assist;Max assist         General transfer comment: mod to max assist to stand from bed x1 trial (bed height elevated) and from recliner x1 trial; vc's for UE/LE placement; pt's feet tending to slide forward when standing; assist to initiate and come to full stand  Ambulation/Gait Ambulation/Gait assistance: Min assist;+2 safety/equipment Gait Distance (Feet): 30 Feet Assistive device: Rolling walker (2 wheeled)   Gait velocity: decreased   General Gait Details: antalgic; decreased stance time R LE; R knee mildly flexing during R LE stance phase (when advancing L LE); limited distance d/t fatigue  Stairs            Wheelchair Mobility    Modified Rankin (Stroke Patients Only)       Balance Overall balance assessment: Needs assistance Sitting-balance support: No upper extremity supported;Bilateral upper extremity supported Sitting balance-Leahy Scale: Good Sitting balance - Comments: steady sitting reaching within BOS   Standing balance support: Bilateral upper extremity supported Standing balance-Leahy Scale: Poor Standing balance comment: pt demonstrating heavy reliance on walker with B UE's in standing  Pertinent Vitals/Pain Pain Assessment: Faces Faces Pain Scale: Hurts a little bit Pain Location: R hip Pain Descriptors / Indicators: Sore Pain Intervention(s): Limited activity within patient's tolerance;Monitored during session;Repositioned  Vitals  (HR and O2 on room air) stable and WFL throughout treatment session.    Home Living Family/patient expects to be discharged to:: Private residence Living Arrangements: Alone Available Help at Discharge: Family;Available 24 hours/day Type of Home: House Home Access: Ramped entrance     Home Layout: One level Home Equipment: Walker - 2 wheels;Bedside commode;Grab bars - tub/shower;Grab bars - toilet;Wheelchair - manual      Prior Function Level of Independence: Needs assistance   Gait / Transfers Assistance Needed: Ambulatory household distances with RW     Comments: Pt reports having 24/7 assist from his 2 sons and 1 daughter who take turns; no falls since January; has been receiving HHPT at home.     Hand Dominance        Extremity/Trunk Assessment   Upper Extremity Assessment RUE Deficits / Details: R shoulder flexion AROM to grossly 45 degrees (AAROM to approximately 80-90 degrees)--pt reports h/o this (baseline) LUE Deficits / Details: WFL    Lower Extremity Assessment RLE Deficits / Details: R hip flexion 3-/5, knee flexion/extension at least 3/5, and DF/PF at least 3/5 AROM LLE Deficits / Details: grossly at least 3/5 AROM hip flexion, knee flexion/extension, and DF/PF    Cervical / Trunk Assessment Cervical / Trunk Assessment: (forward shoulders/head)  Communication   Communication: No difficulties  Cognition Arousal/Alertness: Awake/alert Behavior During Therapy: WFL for tasks assessed/performed Overall Cognitive Status: Within Functional Limits for tasks assessed                                        General Comments   Nursing cleared pt for participation in physical therapy.  Pt agreeable to PT session.    Exercises  Bed mobility, transfer, and gait training   Assessment/Plan    PT Assessment Patient needs continued PT services  PT Problem List Decreased strength;Decreased activity tolerance;Decreased balance;Decreased  mobility;Decreased knowledge of use of DME       PT Treatment Interventions DME instruction;Gait training;Functional mobility training;Therapeutic activities;Therapeutic exercise;Balance training;Patient/family education    PT Goals (Current goals can be found in the Care Plan section)  Acute Rehab PT Goals Patient Stated Goal: to go home and do rehab at home PT Goal Formulation: With patient Time For Goal Achievement: 12/30/19 Potential to Achieve Goals: Fair    Frequency Min 2X/week   Barriers to discharge   Question level of assist available    Co-evaluation               AM-PAC PT "6 Clicks" Mobility  Outcome Measure Help needed turning from your back to your side while in a flat bed without using bedrails?: A Little Help needed moving from lying on your back to sitting on the side of a flat bed without using bedrails?: A Lot Help needed moving to and from a bed to a chair (including a wheelchair)?: A Lot Help needed standing up from a chair using your arms (e.g., wheelchair or bedside chair)?: A Lot Help needed to walk in hospital room?: A Little Help needed climbing 3-5 steps with a railing? : Total 6 Click Score: 13    End of Session Equipment Utilized During Treatment: Gait belt Activity Tolerance: Patient limited by fatigue Patient  left: in chair;with call bell/phone within reach;with chair alarm set;with nursing/sitter in room;Other (comment)(B heels floating via pillow) Nurse Communication: Mobility status;Precautions PT Visit Diagnosis: Other abnormalities of gait and mobility (R26.89);Muscle weakness (generalized) (M62.81);Difficulty in walking, not elsewhere classified (R26.2);Pain;History of falling (Z91.81) Pain - Right/Left: Right Pain - part of body: Hip    Time: 1427-6701 PT Time Calculation (min) (ACUTE ONLY): 43 min   Charges:   PT Evaluation $PT Eval Low Complexity: 1 Low PT Treatments $Therapeutic Activity: 23-37 mins        Leitha Bleak, PT 12/16/19, 12:14 PM

## 2019-12-16 NOTE — Care Management Important Message (Signed)
Important Message  Patient Details  Name: Danny Brady MRN: [id number] Date of Birth: Jun 02, 1927   Medicare Important Message Given:  Yes     [name] 12/16/2019, 1:25 PM

## 2019-12-16 NOTE — Progress Notes (Signed)
Danny Repress, MD [address]  [address]  [place], [place] [address]  Main: [phone]  Fax: [phone] Pager: [phone]   Subjective: He denies any complaints today.  He is going home today   Objective: Vital signs in last 24 hours: Vitals:   12/16/19 0737 12/16/19 1130 12/16/19 1533 12/16/19 1808  BP: 121/62 109/65 (!) 95/58 117/65  Pulse: 83 70 81 70  Resp: 18 18 18    Temp: 98.2 F (36.8 C) 98 F (36.7 C) 97.8 F (36.6 C)   TempSrc: Oral Oral Oral   SpO2: 98% 97% 100%   Weight:      Height:       Weight change:   Intake/Output Summary (Last 24 hours) at 12/16/2019 1823 Last data filed at 12/16/2019 1759 Gross per 24 hour  Intake 360 ml  Output 2000 ml  Net -1640 ml     Exam: Heart:: Regular rate and rhythm, S1S2 present or without murmur or extra heart sounds Lungs: normal and clear to auscultation Abdomen: soft, nontender, normal bowel sounds   Lab Results: CBC Latest Ref Rng & Units 12/16/2019 12/15/2019 12/14/2019  WBC 4.0 - 10.5 K/uL 8.6 11.4(H) 22.8(H)  Hemoglobin 13.0 - 17.0 g/dL 12/16/2019) 9.0(Z) 0.0(P)  Hematocrit 39.0 - 52.0 % 27.9(L) 26.4(L) 29.3(L)  Platelets 150 - 400 K/uL 118(L) 138(L) 178   CMP Latest Ref Rng & Units 12/16/2019 12/16/2019 12/15/2019  Glucose 70 - 99 mg/dL - 95 86  BUN 8 - 23 mg/dL - 23 23  Creatinine 12/17/2019 - 1.24 mg/dL - 0.07 6.22  Sodium 6.33 - 145 mmol/L - 136 135  Potassium 3.5 - 5.1 mmol/L 4.3 3.3(L) 2.6(LL)  Chloride 98 - 111 mmol/L - 103 102  CO2 22 - 32 mmol/L - 26 21(L)  Calcium 8.9 - 10.3 mg/dL - 8.1(L) 8.0(L)  Total Protein 6.5 - 8.1 g/dL - 5.1(L) 5.0(L)  Total Bilirubin 0.3 - 1.2 mg/dL - 2.2(H) 3.7(H)  Alkaline Phos 38 - 126 U/L - 154(H) 154(H)  AST 15 - 41 U/L - 30 60(H)  ALT 0 - 44 U/L - 53(H) 72(H)    Micro Results: Recent Results (from the past 240 hour(s))  Blood culture (routine x 2)     Status: None (Preliminary result)   Collection Time: 12/13/19  7:32 PM   Specimen: BLOOD  Result Value  Ref Range Status   Specimen Description BLOOD BLOOD LEFT HAND  Final   Special Requests   Final    BOTTLES DRAWN AEROBIC AND ANAEROBIC Blood Culture adequate volume   Culture   Final    NO GROWTH 3 DAYS Performed at [facility], [address] [place], [place]    Report Status PENDING  Incomplete  Blood culture (routine x 2)     Status: None (Preliminary result)   Collection Time: 12/13/19  7:32 PM   Specimen: BLOOD  Result Value Ref Range Status   Specimen Description BLOOD LEFT ANTECUBITAL  Final   Special Requests   Final    BOTTLES DRAWN AEROBIC AND ANAEROBIC Blood Culture adequate volume   Culture   Final    NO GROWTH 3 DAYS Performed at [facility], [address] [place], [place]    Report Status PENDING  Incomplete  Respiratory Panel by RT PCR (Flu A&B, Covid) - Nasopharyngeal Swab     Status: None   Collection Time: 12/13/19  8:14 PM   Specimen: Nasopharyngeal Swab  Result Value Ref Range  Status   SARS Coronavirus 2 by RT PCR NEGATIVE NEGATIVE Final    Comment: (NOTE) SARS-CoV-2 target nucleic acids are NOT DETECTED. The SARS-CoV-2 RNA is generally detectable in upper respiratoy specimens during the acute phase of infection. The lowest concentration of SARS-CoV-2 viral copies this assay can detect is 131 copies/mL. A negative result does not preclude SARS-Cov-2 infection and should not be used as the sole basis for treatment or other patient management decisions. A negative result may occur with  improper specimen collection/handling, submission of specimen other than nasopharyngeal swab, presence of viral mutation(s) within the areas targeted by this assay, and inadequate number of viral copies (<131 copies/mL). A negative result must be combined with clinical observations, patient history, and epidemiological information. The expected result is Negative. Fact Sheet for Patients:   PinkCheek.be Fact Sheet for Healthcare Providers:  GravelBags.it This test is not yet ap proved or cleared by the [place] FDA and  has been authorized for detection and/or diagnosis of SARS-CoV-2 by FDA under an Emergency Use Authorization (EUA). This EUA will remain  in effect (meaning this test can be used) for the duration of the COVID-19 declaration under Section 564(b)(1) of the Act, 21 U.S.C. section 360bbb-3(b)(1), unless the authorization is terminated or revoked sooner.    Influenza A by PCR NEGATIVE NEGATIVE Final   Influenza B by PCR NEGATIVE NEGATIVE Final    Comment: (NOTE) The Xpert Xpress SARS-CoV-2/FLU/RSV assay is intended as an aid in  the diagnosis of influenza from Nasopharyngeal swab specimens and  should not be used as a sole basis for treatment. Nasal washings and  aspirates are unacceptable for Xpert Xpress SARS-CoV-2/FLU/RSV  testing. Fact Sheet for Patients: PinkCheek.be Fact Sheet for Healthcare Providers: GravelBags.it This test is not yet approved or cleared by the [place] FDA and  has been authorized for detection and/or diagnosis of SARS-CoV-2 by  FDA under an Emergency Use Authorization (EUA). This EUA will remain  in effect (meaning this test can be used) for the duration of the  Covid-19 declaration under Section 564(b)(1) of the Act, 21  U.S.C. section 360bbb-3(b)(1), unless the authorization is  terminated or revoked. Performed at [facility], [address] [place], [place] [address]    Studies/Results: No results found. Medications:  I have reviewed the patient's current medications. Prior to Admission:  Medications Prior to Admission  Medication Sig Dispense Refill Last Dose  . aspirin EC 81 MG EC tablet Take 1 tablet (81 mg total) by mouth daily. 30 tablet 0 12/13/2019 at 0900  . ferrous sulfate 325  (65 FE) MG tablet Take 1 tablet (325 mg total) by mouth daily with breakfast.  3 12/13/2019 at 0900  . finasteride (PROSCAR) 5 MG tablet Take 5 mg by mouth daily.   12/13/2019 at 0900  . omeprazole (PRILOSEC) 20 MG capsule Take 20 mg by mouth daily.   12/13/2019 at 0900  . spironolactone (ALDACTONE) 25 MG tablet Take 0.5 mg by mouth daily.   12/13/2019 at 0900  . [DISCONTINUED] acetaminophen (TYLENOL) 500 MG tablet Take 1 tablet (500 mg total) by mouth every 6 (six) hours as needed.  0 12/13/2019 at 0900  . [DISCONTINUED] furosemide (LASIX) 20 MG tablet Take 40 mg by mouth daily.   12/13/2019 at 0900  . methocarbamol (ROBAXIN) 500 MG tablet Take 1 tablet (500 mg total) by mouth every 8 (eight) hours as needed for muscle spasms.   unknown at prn  . ondansetron (ZOFRAN) 4 MG tablet Take  4 mg by mouth every 8 (eight) hours as needed for nausea/vomiting.     Marland Kitchen oxyCODONE-acetaminophen (PERCOCET/ROXICET) 5-325 MG tablet Take 1 tablet by mouth every 6 (six) hours as needed for moderate pain. 30 tablet 0 unknown at prn   Scheduled: . aspirin EC  81 mg Oral Daily  . doxycycline  100 mg Oral Q12H  . finasteride  5 mg Oral Daily  . furosemide  40 mg Oral Daily  . magnesium oxide  400 mg Oral Daily  . pantoprazole  40 mg Oral Daily  . potassium chloride  20 mEq Oral Daily  . spironolactone  12.5 mg Oral Daily   Continuous: . sodium chloride 250 mL (12/16/19 0934)   FEO:FHQRFX chloride, methocarbamol, oxyCODONE, traZODone Anti-infectives (From admission, onward)   Start     Dose/Rate Route Frequency Ordered Stop   12/16/19 1000  doxycycline (VIBRA-TABS) tablet 100 mg     100 mg Oral Every 12 hours 12/16/19 0853 12/19/19 0959   12/16/19 0900  cefTRIAXone (ROCEPHIN) 2 g in sodium chloride 0.9 % 100 mL IVPB  Status:  Discontinued     2 g 200 mL/hr over 30 Minutes Intravenous Every 24 hours 12/15/19 0815 12/16/19 0852   12/16/19 0000  doxycycline (VIBRA-TABS) 100 MG tablet     100 mg Oral Every 12 hours  12/16/19 1657 12/19/19 2359   12/15/19 1200  cefTRIAXone (ROCEPHIN) 2 g in sodium chloride 0.9 % 100 mL IVPB     2 g 200 mL/hr over 30 Minutes Intravenous  Once 12/15/19 1102 12/15/19 1345   12/15/19 0815  cefTRIAXone (ROCEPHIN) 1 g in sodium chloride 0.9 % 100 mL IVPB  Status:  Discontinued     1 g 200 mL/hr over 30 Minutes Intravenous Every 24 hours 12/15/19 0807 12/15/19 0815   12/13/19 1845  cefTRIAXone (ROCEPHIN) 1 g in sodium chloride 0.9 % 100 mL IVPB     1 g 200 mL/hr over 30 Minutes Intravenous  Once 12/13/19 1840 12/13/19 2053     Scheduled Meds: . aspirin EC  81 mg Oral Daily  . doxycycline  100 mg Oral Q12H  . finasteride  5 mg Oral Daily  . furosemide  40 mg Oral Daily  . magnesium oxide  400 mg Oral Daily  . pantoprazole  40 mg Oral Daily  . potassium chloride  20 mEq Oral Daily  . spironolactone  12.5 mg Oral Daily   Continuous Infusions: . sodium chloride 250 mL (12/16/19 0934)   PRN Meds:.sodium chloride, methocarbamol, oxyCODONE, traZODone   Assessment: Principal Problem:   Elevated liver enzymes Active Problems:   Hypertension   Chronic systolic CHF (congestive heart failure) (HCC)   Closed comminuted fracture of right hip (HCC)   Hypokalemia   PAF (paroxysmal atrial fibrillation) (HCC)   Transaminitis   Lactic acidosis   Hypomagnesemia   Thrombocytopenia (HCC)  Elevated LFTs, downtrending.  There is no evidence of biliary obstruction Elevated acetaminophen levels on admission.  Patient also received antibiotics at the time of recent hip surgery on 11/20/2019, could be secondary to DILI.  Acute viral hepatitis panel negative.  Patient denies any new prescription medications or herbal supplements.  He denies NSAID use, does not drink alcohol, salicylate negative  Plan: Recheck LFTs in 1 week by his PCP Okay to use Tylenol not more than 2 g/day Follow-up with GI as needed    LOS: 3 days   Sammantha Mehlhaff 12/16/2019, 6:23 PM

## 2019-12-16 NOTE — Consult Note (Signed)
PHARMACY CONSULT NOTE - FOLLOW UP  Pharmacy Consult for Electrolyte Monitoring and Replacement   Recent Labs: Potassium (mmol/L)  Date Value  12/16/2019 3.3 (L)  07/18/2012 3.5   Magnesium (mg/dL)  Date Value  29/79/8921 1.6 (L)   Calcium (mg/dL)  Date Value  19/41/7408 8.1 (L)   Albumin (g/dL)  Date Value  14/48/1856 2.3 (L)   Phosphorus (mg/dL)  Date Value  31/49/7026 2.2 (L)   Sodium (mmol/L)  Date Value  12/16/2019 136     Assessment: 86 yo man being treated for elevated enzymes with vomiteing, pain, and reported fever.  Pt on furosemide 40mg  qd and spironolactone 12.5mg  qd - resumed diet on 2/19 with varying po intake, but taking po meds.  Pharmacy has been consulted to replete electrolytes prior to discharge.  Goal of Therapy:  Electrolytes wnl's  Plan:  Will give 40 meq KCL po now, as well as 500mg  KPhos x 2.  Will give 2g IV bolus Magnesium.  Will follow-up labs @ 1600 to ensure safe levels for discharge.  3/19, PharmD, BCPS Clinical Pharmacist 12/16/2019 8:55 AM

## 2019-12-16 NOTE — Consult Note (Signed)
WOC Nurse Consult Note: Reason for Consult:L arm skin tear Patient is noted to have surgical site on the left shoulder as well from skin cancer removal per dermatology. Self reports skin tear sustained when "they picked me up".  Skin does appear thin and fragile Wound type: 1. Skin tear left posterior tricep area; 2cm x 1.5cm x 0.1cm  2. Surgical site; well approximated   Pressure Injury POA: NA Measurement: see above Wound bed: skin tear; fibrinous but clean Drainage (amount, consistency, odor) none Periwound: intact Dressing procedure/placement/frequency: Silicone foam to protect each site and lessen likelihood of additional skin damage. Change every 3 days.   Discussed POC with patient and bedside nurse.  Re consult if needed, will not follow at this time. Thanks  Samie Barclift [employer], RN,CWOCN, CNS, CWON-AP [phone])

## 2019-12-16 NOTE — Plan of Care (Signed)
°  Problem: Education: °Goal: Knowledge of General Education information will improve °Description: Including pain rating scale, medication(s)/side effects and non-pharmacologic comfort measures °Outcome: Progressing °  °Problem: Clinical Measurements: °Goal: Diagnostic test results will improve °Outcome: Progressing °  °Problem: Pain Managment: °Goal: General experience of comfort will improve °Outcome: Progressing °  °Problem: Safety: °Goal: Ability to remain free from injury will improve °Outcome: Progressing °  °

## 2019-12-18 LAB — CULTURE, BLOOD (ROUTINE X 2)
Culture: NO GROWTH
Culture: NO GROWTH
Special Requests: ADEQUATE
Special Requests: ADEQUATE

## 2019-12-24 ENCOUNTER — Other Ambulatory Visit: Payer: Self-pay

## 2019-12-24 ENCOUNTER — Other Ambulatory Visit: Payer: Self-pay | Admitting: Internal Medicine

## 2019-12-24 ENCOUNTER — Ambulatory Visit
Admission: RE | Admit: 2019-12-24 | Discharge: 2019-12-24 | Disposition: A | Discharge status: Home / Self Care | Payer: Medicare PPO | Source: Ambulatory Visit | Attending: Internal Medicine | Admitting: Internal Medicine

## 2019-12-24 DIAGNOSIS — R7401 Elevation of levels of liver transaminase levels: Secondary | ICD-10-CM | POA: Diagnosis present

## 2019-12-24 DIAGNOSIS — R17 Unspecified jaundice: Secondary | ICD-10-CM

## 2019-12-30 ENCOUNTER — Encounter (INDEPENDENT_AMBULATORY_CARE_PROVIDER_SITE_OTHER): Payer: Self-pay

## 2019-12-30 ENCOUNTER — Other Ambulatory Visit: Payer: Self-pay

## 2019-12-30 ENCOUNTER — Ambulatory Visit (INDEPENDENT_AMBULATORY_CARE_PROVIDER_SITE_OTHER): Payer: Medicare PPO | Admitting: Gastroenterology

## 2019-12-30 ENCOUNTER — Encounter: Payer: Self-pay | Admitting: Gastroenterology

## 2019-12-30 VITALS — BP 116/68 | HR 92 | Ht 67.0 in

## 2019-12-30 DIAGNOSIS — R748 Abnormal levels of other serum enzymes: Secondary | ICD-10-CM

## 2019-12-30 NOTE — Progress Notes (Signed)
Primary Care Physician: Rusty Aus, MD  Primary Gastroenterologist:  Dr. Lucilla Lame  Chief Complaint  Patient presents with  . Elevated Hepatic Enzymes   HPI: Danny Brady is a 86 y.o. male here for follow-up after being seen by surgery and having abnormal liver enzymes.  The patient's bilirubin appears to be up since he was in the hospital but his liver enzymes have gone down.  He was recently seen by surgery who had discussed gallbladder removal with the patient and the patient's son because of stones found on the ultrasound.  The patient's most recent ultrasound done last week showed a common bile duct of 2 mm without any sign of obstruction.  The patient was offered laparoscopic cholecystectomy with intraoperative cholangiogram versus having an ERCP evaluation.  The patient was noted to have an elevated white cell count of 13 and bilirubin of 5.8.  The patient's ultrasound on March 2 showed:  IMPRESSION: Cholelithiasis and biliary sludge without sonographic evidence of acute cholecystitis or biliary ductal dilatation.  Otherwise unremarkable right upper quadrant ultrasound.  The patient comes today with his son and daughter which is a different son then he went to the surgical appointment with. He has been feeling well without any issues at the present time. He was seen on the third of this month by surgery who discussed the options with the son who is not present and at that time it was thought best to see GI for evaluation for possible ERCP.  Past Medical History:  Diagnosis Date  . Edema   . Hypertension     Current Outpatient Medications  Medication Sig Dispense Refill  . acetaminophen (TYLENOL) 500 MG tablet Take 1 tablet (500 mg total) by mouth every 8 (eight) hours as needed. 30 tablet 0  . ALPRAZolam (XANAX) 0.25 MG tablet     . amoxicillin-clavulanate (AUGMENTIN) 875-125 MG tablet     . aspirin EC 81 MG EC tablet Take 1 tablet (81 mg total) by mouth daily. 30  tablet 0  . ferrous sulfate 325 (65 FE) MG tablet Take 1 tablet (325 mg total) by mouth daily with breakfast.  3  . finasteride (PROSCAR) 5 MG tablet Take 5 mg by mouth daily.    . furosemide (LASIX) 20 MG tablet Take 1 tablet (20 mg total) by mouth daily. 30 tablet 0  . magnesium oxide (MAG-OX) 400 (241.3 Mg) MG tablet Take 1 tablet (400 mg total) by mouth daily. 15 tablet 0  . nystatin (MYCOSTATIN) 100000 UNIT/ML suspension     . omeprazole (PRILOSEC) 20 MG capsule Take 20 mg by mouth daily.    . ondansetron (ZOFRAN) 4 MG tablet Take 4 mg by mouth every 8 (eight) hours as needed for nausea/vomiting.    . potassium chloride (KLOR-CON) 10 MEQ tablet     . potassium chloride SA (KLOR-CON) 10 MEQ tablet Take 1 tablet (10 mEq total) by mouth daily. 30 tablet 1  . spironolactone (ALDACTONE) 25 MG tablet Take 0.5 mg by mouth daily.     No current facility-administered medications for this visit.    Allergies as of 12/30/2019 - Review Complete 12/30/2019  Allergen Reaction Noted  . Prednisone Other (See Comments) 03/26/2014  . Tamsulosin Other (See Comments) 02/07/2014    ROS:  General: Negative for anorexia, weight loss, fever, chills, fatigue, weakness. ENT: Negative for hoarseness, difficulty swallowing , nasal congestion. CV: Negative for chest pain, angina, palpitations, dyspnea on exertion, peripheral edema.  Respiratory: Negative for dyspnea  at rest, dyspnea on exertion, cough, sputum, wheezing.  GI: See history of present illness. GU:  Negative for dysuria, hematuria, urinary incontinence, urinary frequency, nocturnal urination.  Endo: Negative for unusual weight change.    Physical Examination:   BP 116/68   Pulse 92   Ht 5\' 7"  (1.702 m)   BMI 31.92 kg/m   General: Well-nourished, well-developed in no acute distress.  Eyes: No icterus. Conjunctivae pink. Neuro: Alert and oriented x 3.  Grossly intact. Skin: Warm and dry, no jaundice.   Psych: Alert and cooperative,  normal mood and affect.  Labs:    Imaging Studies: CT ABDOMEN PELVIS W CONTRAST  Result Date: 12/13/2019 CLINICAL DATA:  Generalized abdominal pain status post eating 12/15/2019. EXAM: CT ABDOMEN AND PELVIS WITH CONTRAST TECHNIQUE: Multidetector CT imaging of the abdomen and pelvis was performed using the standard protocol following bolus administration of intravenous contrast. CONTRAST:  [place] OMNIPAQUE IOHEXOL 300 MG/ML  SOLN COMPARISON:  None. FINDINGS: Lower chest: The lung bases are clear. The heart is enlarged. Hepatobiliary: The liver is normal. There is suggestion of pericholecystic free fluid with mild gallbladder wall thickening.There is no biliary ductal dilation. Pancreas: Normal contours without ductal dilatation. No peripancreatic fluid collection. Spleen: The spleen is borderline enlarged. Adrenals/Urinary Tract: --Adrenal glands: No adrenal hemorrhage. --Right kidney/ureter: No hydronephrosis or perinephric hematoma. --Left kidney/ureter: No hydronephrosis or perinephric hematoma. --Urinary bladder: There is mild bladder wall thickening. Stomach/Bowel: --Stomach/Duodenum: No hiatal hernia or other gastric abnormality. Normal duodenal course and caliber. --Small bowel: No dilatation or inflammation. --Colon: Rectosigmoid diverticulosis without acute inflammation. --Appendix: Not visualized. No right lower quadrant inflammation or free fluid. Vascular/Lymphatic: Atherosclerotic calcification is present within the non-aneurysmal abdominal aorta, without hemodynamically significant stenosis. --No retroperitoneal lymphadenopathy. --No mesenteric lymphadenopathy. --No pelvic or inguinal lymphadenopathy. Reproductive: Unremarkable Other: No ascites or free air. The abdominal wall is normal. Musculoskeletal. Again noted is an L1 compression fracture. There are degenerative changes throughout the lumbar spine. There is no definite acute displaced fracture identified on this exam. The patient is status post  prior intramedullary nail placement for an intratrochanteric fracture of the right femur. There are old right-sided anterior rib fractures. IMPRESSION: 1. Findings suspicious for gallbladder inflammation. Correlation with dedicated ultrasound is recommended. 2. Rectosigmoid diverticulosis without acute diverticulitis. 3. Mild bladder wall thickening. Recommend correlation with urinalysis. 4. Cardiomegaly. 5. Aortic Atherosclerosis (ICD10-I70.0). Electronically Signed   By: M.D.   On: 12/13/2019 20:12   12/15/2019 Abdomen Limited RUQ  Result Date: 12/24/2019 CLINICAL DATA:  Transaminitis, painless jaundice EXAM: ULTRASOUND ABDOMEN LIMITED RIGHT UPPER QUADRANT COMPARISON:  CT 12/13/2019, ultrasound 12/13/2019 FINDINGS: Gallbladder: Gallbladder appears mildly distended with mobile echogenic gallstones and biliary sludge. Largest stone measures 6 mm in diameter. Wall thickness is normal at 2 mm. No pericholecystic fluid or inflammation. Sonographic 12/15/2019 sign is reportedly negative. Common bile duct: Diameter: 2 mm, nondilated Liver: No focal lesion identified. Within normal limits in parenchymal echogenicity. Portal vein is patent on color Doppler imaging with normal direction of blood flow towards the liver. Other: None. IMPRESSION: Cholelithiasis and biliary sludge without sonographic evidence of acute cholecystitis or biliary ductal dilatation. Otherwise unremarkable right upper quadrant ultrasound. Electronically Signed   By: Eulah Pont M.D.   On: 12/24/2019 17:42   02/23/2020 Abdomen Limited RUQ  Result Date: 12/13/2019 CLINICAL DATA:  Transaminitis EXAM: ULTRASOUND ABDOMEN LIMITED RIGHT UPPER QUADRANT COMPARISON:  None. FINDINGS: Gallbladder: A negative sonographic 12/15/2019 sign was reported by the sonographer. There is a single visible  gallstone. No pericholecystic fluid or gallbladder wall thickening. Common bile duct: Diameter: 5 mm Liver: No focal lesion identified. Within normal limits in  parenchymal echogenicity. Portal vein is patent on color Doppler imaging with normal direction of blood flow towards the liver. Other: None. IMPRESSION: Cholelithiasis without other evidence of acute cholecystitis. Electronically Signed   By: Deatra Robinson M.D.   On: 12/13/2019 18:55    Assessment and Plan:   Danny Brady is a 86 y.o. y/o male who has a small common bile duct without any dilation or stone seen in it. The patient does have abnormal liver enzymes with an increased bilirubin but his AST and ALT are significantly down since discharge from the hospital. I agree that it is concerning that the patient has an elevated white cell count with an increased bilirubin. The possibilities do include that the patient may be showering his bile duct with sludge and stones from the gallbladder. I have discussed in depth all the options including waiting and seeing, ERCP followed by a cholecystectomy, cholecystectomy followed by an ERCP or just doing an ERCP for better drainage. After much discussion with the patient and his family they agree that if one of the procedures could be avoided they would rather go by that route and if the patient has a laparoscopic cholecystectomy with intraoperative cholangiogram showing no obstruction to the common bile duct they may be able to avert having an ERCP and thereby only having one procedure done. Since the source of the problem is the gallbladder with the sludge noted on imaging an ERCP with sphincterotomy is unlikely to solve the problem without a cholecystectomy. The family has been told that an ERCP can be attempted after the gallbladder if there is something found at that time. They state they understand and agree with undergoing the laparoscopic cholecystectomy with intraoperative cholangiogram prior to having the ERCP and hopefully avoiding having to have the ERCP done.     Midge Minium, MD. [facility]    Note: This dictation was prepared with Dragon dictation  along with smaller phrase technology. Any transcriptional errors that result from this process are unintentional.

## 2020-01-01 ENCOUNTER — Ambulatory Visit: Payer: Self-pay | Admitting: Surgery

## 2020-01-01 NOTE — H&P (View-Only) (Signed)
Subjective:   CC: Elevated LFTs [R79.89]  HPI:  Danny Brady is a 86 y.o. male who was referred by Carlynn Purl, MD for evaluation of above CC. Symptoms were first noted 2 weeks ago. Pain is sharp and discomfort, confined to the epigastric area, without radiation.  Associated with n/v and fever 101 per rectal on initial presentation, exacerbated by nothing specific.  At the time of initial presentation, patient was admitted for observation.  LFTs spontaneously started decreasing and symptoms resolve so patient was told to follow-up with primary as an outpatient.  Upon her return visit to the primary labs were noted to continue to be decreasing.  However, patient developed an episode of nausea vomiting again and a repeat lab and ultrasound noted persistent cholelithiasis with no sign of acute cholecystitis, but his total bilirubin level is increased again.  Here today to discuss further management.  Currently patient denies any acute pain.  Feels very slight discomfort in the epigastric region.  No issues with nausea or vomiting since a few days ago.  Continue to tolerate liquid diet and small amounts of solids.      Past Medical History:  has a past medical history of Bilateral edema of lower extremity (11/03/2015), GERD (gastroesophageal reflux disease), Hyperlipidemia, and Hypertension.  Past Surgical History:  has a past surgical history that includes Hernia repair (Bilateral); Appendectomy; Replacement total knee bilateral (Bilateral); Arthroscopic Rotator Cuff Repair (Right); and other surgery.  Family History: family history includes Coronary Artery Disease (Blocked arteries around heart) in his father and mother.  Social History:  reports that he has never smoked. He has never used smokeless tobacco. He reports that he does not drink alcohol or use drugs.  Current Medications: has a current medication list which includes the following prescription(s): acetaminophen,  alfuzosin, alprazolam, aspirin, cyanocobalamin, finasteride, furosemide, omeprazole, and potassium chloride.  Allergies:       Allergies as of 12/25/2019 - Reviewed 12/25/2019  Allergen Reaction Noted  . Flomax [tamsulosin] Syncope and Other (See Comments) 02/07/2014  . Prednisone Other (See Comments) 03/26/2014    ROS:  A 15 point review of systems was performed and pertinent positives and negatives noted in HPI    Objective:   BP 119/74   Pulse 78   Ht 180.3 cm (5' 10.98")   Wt 95.3 kg (210 lb)   BMI 29.31 kg/m    Constitutional :  alert, appears stated age, cooperative and no distress  Lymphatics/Throat:  no asymmetry, masses, or scars  Respiratory:  clear to auscultation bilaterally  Cardiovascular:  regular rate and rhythm  Gastrointestinal: soft, non-tender; bowel sounds normal; no masses,  no organomegaly.    Musculoskeletal: Steady gait and movement  Skin: Cool and moist  Psychiatric: Normal affect, non-agitated, not confused       LABS:  -      Lab Results  Component Value Date   WBC 13.2 (H) 12/24/2019   HGB 10.4 (L) 12/24/2019   HCT 31.7 (L) 12/24/2019   PLT 232 12/24/2019   - Lab Results  Component Value Date   NA 132 (L) 12/24/2019   K 3.5 (L) 12/24/2019   CL 97 12/24/2019   CO2 28.7 12/24/2019   BUN 13 12/24/2019   CREATININE 0.8 12/24/2019   CALCIUM 8.3 (L) 12/24/2019   ALB 3.2 (L) 12/24/2019   TBILI 5.9 (H) 12/24/2019   ALKPHOS 239 (H) 12/24/2019   AST 73 (H) 12/24/2019   ALT 46 12/24/2019   GLUCOSE 142 (H) 12/24/2019  GFR 91 12/24/2019   -D. BILI 3.86   RADS: CLINICAL DATA: Transaminitis, painless jaundice  EXAM: ULTRASOUND ABDOMEN LIMITED RIGHT UPPER QUADRANT  COMPARISON: CT 12/13/2019, ultrasound 12/13/2019  FINDINGS: Gallbladder:  Gallbladder appears mildly distended with mobile echogenic gallstones and biliary sludge. Largest stone measures 6 mm in diameter. Wall thickness is normal at 2  mm. No pericholecystic fluid or inflammation. Sonographic Percell Miller sign is reportedly negative.  Common bile duct:  Diameter: 2 mm, nondilated  Liver:  No focal lesion identified. Within normal limits in parenchymal echogenicity. Portal vein is patent on color Doppler imaging with normal direction of blood flow towards the liver.  Other: None.  IMPRESSION: Cholelithiasis and biliary sludge without sonographic evidence of acute cholecystitis or biliary ductal dilatation.  Otherwise unremarkable right upper quadrant ultrasound.   Electronically Signed  By: Lovena Le M.D.  On: 12/24/2019 17:42  Assessment:      Elevated LFTs [R79.89]  Plan:   1. Elevated LFTs [R79.89] Patient history concerning for recurrent choledocholithiasis causing intermittent elevation of his LFTs and symptoms of nausea and vomiting.  Despite the concerning lab numbers drawn yesterday, patient clinically looks well with no tenderness to palpation on exam today and currently experiencing minimal symptoms.  We discussed the possibility of repeat ERCP request from the GI service or proceeding straight to a lap chole.  I explained to the patient that if we proceed straight to lap chole without the ERCP, I can perform a intraoperative cholangiogram to further assess the common bile duct.  The issue is if a stone is found I have very limited capabilities to remove the common bile duct stone.  Therefore, we typically recommned evaluation by GI service to see if ERCP is a viable option for the patient prior to proceeding with a lap chole.  Patient and son both verbalized understanding.   We did have a extensive discussion regarding continue to monitor the patient as an inpatient or close follow-up as an outpatient.  Son stated that the patient has some issues with hospital induced dementia and deconditioning on previous visits.  Patient also requested that we stay out of the hospital if at all possible.  Based  on his clinical exam today as well as what seems to be a very well family support system, we decided that close observation as an outpatient will be a viable option at this time, with a very low threshold of transferring to inpatient observation if there is any signs of worsening symptoms.   Due to the leukocytosis noted on the CBC yesterday, we will place him on antibiotics while awaiting further evaluation from the GI service.   Reached out to Dr. Allen Norris who saw the patient on the previous admission and he recommended that patient be seen in his office as an outpatient to further discuss risk benefits alternatives to ERCP. I explained to them once the GI service recommends a specific plan, we can readdress the possibility of proceeding with a lap chole.  UPDATE: Dr. Allen Norris did not recommend ERCP uprfront, therefore will proceed with lap chole with IOC.   Discussed the risk of surgery including post-op infxn, seroma, biloma, chronic pain, poor-delayed wound healing, retained gallstone, conversion to open procedure, post-op SBO or ileus, and need for additional procedures to address said risks.  The risks of general anesthetic including MI, CVA, sudden death or even reaction to anesthetic medications also discussed. Alternatives include continued observation.  Benefits include possible symptom relief, prevention of complications including acute cholecystitis, pancreatitis.  Typical post operative recovery of 3-5 days rest, continued pain in area and incision sites, possible loose stools up to 4-6 weeks, also discussed.  

## 2020-01-01 NOTE — H&P (Signed)
Subjective:   CC: Elevated LFTs [R79.89]  HPI:  Danny Brady is a 86 y.o. male who was referred by Carlynn Purl, MD for evaluation of above CC. Symptoms were first noted 2 weeks ago. Pain is sharp and discomfort, confined to the epigastric area, without radiation.  Associated with n/v and fever 101 per rectal on initial presentation, exacerbated by nothing specific.  At the time of initial presentation, patient was admitted for observation.  LFTs spontaneously started decreasing and symptoms resolve so patient was told to follow-up with primary as an outpatient.  Upon her return visit to the primary labs were noted to continue to be decreasing.  However, patient developed an episode of nausea vomiting again and a repeat lab and ultrasound noted persistent cholelithiasis with no sign of acute cholecystitis, but his total bilirubin level is increased again.  Here today to discuss further management.  Currently patient denies any acute pain.  Feels very slight discomfort in the epigastric region.  No issues with nausea or vomiting since a few days ago.  Continue to tolerate liquid diet and small amounts of solids.      Past Medical History:  has a past medical history of Bilateral edema of lower extremity (11/03/2015), GERD (gastroesophageal reflux disease), Hyperlipidemia, and Hypertension.  Past Surgical History:  has a past surgical history that includes Hernia repair (Bilateral); Appendectomy; Replacement total knee bilateral (Bilateral); Arthroscopic Rotator Cuff Repair (Right); and other surgery.  Family History: family history includes Coronary Artery Disease (Blocked arteries around heart) in his father and mother.  Social History:  reports that he has never smoked. He has never used smokeless tobacco. He reports that he does not drink alcohol or use drugs.  Current Medications: has a current medication list which includes the following prescription(s): acetaminophen,  alfuzosin, alprazolam, aspirin, cyanocobalamin, finasteride, furosemide, omeprazole, and potassium chloride.  Allergies:       Allergies as of 12/25/2019 - Reviewed 12/25/2019  Allergen Reaction Noted  . Flomax [tamsulosin] Syncope and Other (See Comments) 02/07/2014  . Prednisone Other (See Comments) 03/26/2014    ROS:  A 15 point review of systems was performed and pertinent positives and negatives noted in HPI    Objective:   BP 119/74   Pulse 78   Ht 180.3 cm (5' 10.98")   Wt 95.3 kg (210 lb)   BMI 29.31 kg/m    Constitutional :  alert, appears stated age, cooperative and no distress  Lymphatics/Throat:  no asymmetry, masses, or scars  Respiratory:  clear to auscultation bilaterally  Cardiovascular:  regular rate and rhythm  Gastrointestinal: soft, non-tender; bowel sounds normal; no masses,  no organomegaly.    Musculoskeletal: Steady gait and movement  Skin: Cool and moist  Psychiatric: Normal affect, non-agitated, not confused       LABS:  -      Lab Results  Component Value Date   WBC 13.2 (H) 12/24/2019   HGB 10.4 (L) 12/24/2019   HCT 31.7 (L) 12/24/2019   PLT 232 12/24/2019   - Lab Results  Component Value Date   NA 132 (L) 12/24/2019   K 3.5 (L) 12/24/2019   CL 97 12/24/2019   CO2 28.7 12/24/2019   BUN 13 12/24/2019   CREATININE 0.8 12/24/2019   CALCIUM 8.3 (L) 12/24/2019   ALB 3.2 (L) 12/24/2019   TBILI 5.9 (H) 12/24/2019   ALKPHOS 239 (H) 12/24/2019   AST 73 (H) 12/24/2019   ALT 46 12/24/2019   GLUCOSE 142 (H) 12/24/2019  GFR 91 12/24/2019   -D. BILI 3.86   RADS: CLINICAL DATA: Transaminitis, painless jaundice  EXAM: ULTRASOUND ABDOMEN LIMITED RIGHT UPPER QUADRANT  COMPARISON: CT 12/13/2019, ultrasound 12/13/2019  FINDINGS: Gallbladder:  Gallbladder appears mildly distended with mobile echogenic gallstones and biliary sludge. Largest stone measures 6 mm in diameter. Wall thickness is normal at 2  mm. No pericholecystic fluid or inflammation. Sonographic Percell Miller sign is reportedly negative.  Common bile duct:  Diameter: 2 mm, nondilated  Liver:  No focal lesion identified. Within normal limits in parenchymal echogenicity. Portal vein is patent on color Doppler imaging with normal direction of blood flow towards the liver.  Other: None.  IMPRESSION: Cholelithiasis and biliary sludge without sonographic evidence of acute cholecystitis or biliary ductal dilatation.  Otherwise unremarkable right upper quadrant ultrasound.   Electronically Signed  By: Lovena Le M.D.  On: 12/24/2019 17:42  Assessment:      Elevated LFTs [R79.89]  Plan:   1. Elevated LFTs [R79.89] Patient history concerning for recurrent choledocholithiasis causing intermittent elevation of his LFTs and symptoms of nausea and vomiting.  Despite the concerning lab numbers drawn yesterday, patient clinically looks well with no tenderness to palpation on exam today and currently experiencing minimal symptoms.  We discussed the possibility of repeat ERCP request from the GI service or proceeding straight to a lap chole.  I explained to the patient that if we proceed straight to lap chole without the ERCP, I can perform a intraoperative cholangiogram to further assess the common bile duct.  The issue is if a stone is found I have very limited capabilities to remove the common bile duct stone.  Therefore, we typically recommned evaluation by GI service to see if ERCP is a viable option for the patient prior to proceeding with a lap chole.  Patient and son both verbalized understanding.   We did have a extensive discussion regarding continue to monitor the patient as an inpatient or close follow-up as an outpatient.  Son stated that the patient has some issues with hospital induced dementia and deconditioning on previous visits.  Patient also requested that we stay out of the hospital if at all possible.  Based  on his clinical exam today as well as what seems to be a very well family support system, we decided that close observation as an outpatient will be a viable option at this time, with a very low threshold of transferring to inpatient observation if there is any signs of worsening symptoms.   Due to the leukocytosis noted on the CBC yesterday, we will place him on antibiotics while awaiting further evaluation from the GI service.   Reached out to Dr. Allen Norris who saw the patient on the previous admission and he recommended that patient be seen in his office as an outpatient to further discuss risk benefits alternatives to ERCP. I explained to them once the GI service recommends a specific plan, we can readdress the possibility of proceeding with a lap chole.  UPDATE: Dr. Allen Norris did not recommend ERCP uprfront, therefore will proceed with lap chole with IOC.   Discussed the risk of surgery including post-op infxn, seroma, biloma, chronic pain, poor-delayed wound healing, retained gallstone, conversion to open procedure, post-op SBO or ileus, and need for additional procedures to address said risks.  The risks of general anesthetic including MI, CVA, sudden death or even reaction to anesthetic medications also discussed. Alternatives include continued observation.  Benefits include possible symptom relief, prevention of complications including acute cholecystitis, pancreatitis.  Typical post operative recovery of 3-5 days rest, continued pain in area and incision sites, possible loose stools up to 4-6 weeks, also discussed.

## 2020-01-07 ENCOUNTER — Inpatient Hospital Stay: Admission: RE | Admit: 2020-01-07 | Payer: Medicare PPO | Source: Ambulatory Visit

## 2020-01-08 ENCOUNTER — Encounter
Admission: RE | Admit: 2020-01-08 | Discharge: 2020-01-08 | Disposition: A | Discharge status: Home / Self Care | Payer: Medicare PPO | Source: Ambulatory Visit | Attending: Surgery | Admitting: Surgery

## 2020-01-08 ENCOUNTER — Other Ambulatory Visit: Payer: Self-pay

## 2020-01-08 HISTORY — DX: Anxiety disorder, unspecified: F41.9

## 2020-01-08 HISTORY — DX: Anemia, unspecified: D64.9

## 2020-01-08 HISTORY — DX: Gastro-esophageal reflux disease without esophagitis: K21.9

## 2020-01-08 NOTE — Patient Instructions (Signed)
Your procedure is scheduled on: 01/17/20 Report to [facility]. To find out your arrival time please call [phone] between 1PM - 3PM on 01/16/20.  Remember: Instructions that are not followed completely may result in serious medical risk, up to and including death, or upon the discretion of your surgeon and anesthesiologist your surgery may need to be rescheduled.     _X__ 1. Do not eat food after midnight the night before your procedure.                 No gum chewing or hard candies. You may drink clear liquids up to 2 hours                 before you are scheduled to arrive for your surgery- DO not drink clear                 liquids within 2 hours of the start of your surgery.                 Clear Liquids include:  water, apple juice without pulp, clear carbohydrate                 drink such as Clearfast or Gatorade, Black Coffee or Tea (Do not add                 anything to coffee or tea). Diabetics water only  __X__2.  On the morning of surgery brush your teeth with toothpaste and water, you                 may rinse your mouth with mouthwash if you wish.  Do not swallow any              toothpaste of mouthwash.     _X__ 3.  No Alcohol for 24 hours before or after surgery.   _X__ 4.  Do Not Smoke or use e-cigarettes For 24 Hours Prior to Your Surgery.                 Do not use any chewable tobacco products for at least 6 hours prior to                 surgery.  ____  5.  Bring all medications with you on the day of surgery if instructed.   __X__  6.  Notify your doctor if there is any change in your medical condition      (cold, fever, infections).     Do not wear jewelry, make-up, hairpins, clips or nail polish. Do not wear lotions, powders, or perfumes.  Do not shave 48 hours prior to surgery. Men may shave face and neck. Do not bring valuables to the hospital.    [facility] is not responsible for any belongings or  valuables.  Contacts, dentures/partials or body piercings may not be worn into surgery. Bring a case for your contacts, glasses or hearing aids, a denture cup will be supplied. Leave your suitcase in the car. After surgery it may be brought to your room. For patients admitted to the hospital, discharge time is determined by your treatment team.   Patients discharged the day of surgery will not be allowed to drive home.   Please read over the following fact sheets that you were given:   MRSA Information  __X__ Take these medicines the morning of surgery with A SIP OF WATER:  1. omeprazole (PRILOSEC) 20 MG capsule  2. Alprazolam   3.   4.  5.  6.  ____ Fleet Enema (as directed)   __X__ Use CHG Soap/SAGE wipes as directed  ____ Use inhalers on the day of surgery  ____ Stop metformin/Janumet/Farxiga 2 days prior to surgery    ____ Take 1/2 of usual insulin dose the night before surgery. No insulin the morning          of surgery.   ____ Stop Blood Thinners Coumadin/Plavix/Xarelto/Pleta/Pradaxa/Eliquis/Effient/Aspirin  on   Or contact your Surgeon, Cardiologist or Medical Doctor regarding  ability to stop your blood thinners  __X__ Stop Anti-inflammatories 7 days before surgery such as Advil, Ibuprofen, Motrin,  BC or Goodies Powder, Naprosyn, Naproxen, Aleve, Aspirin    __X__ Stop all herbal supplements, fish oil or vitamin E until after surgery.    ____ Bring C-Pap to the hospital.

## 2020-01-15 ENCOUNTER — Other Ambulatory Visit: Payer: Self-pay

## 2020-01-15 ENCOUNTER — Other Ambulatory Visit
Admission: RE | Admit: 2020-01-15 | Discharge: 2020-01-15 | Disposition: A | Discharge status: Home / Self Care | Payer: Medicare PPO | Source: Ambulatory Visit | Attending: Surgery | Admitting: Surgery

## 2020-01-15 DIAGNOSIS — K828 Other specified diseases of gallbladder: Secondary | ICD-10-CM | POA: Diagnosis not present

## 2020-01-15 DIAGNOSIS — E785 Hyperlipidemia, unspecified: Secondary | ICD-10-CM | POA: Diagnosis not present

## 2020-01-15 DIAGNOSIS — Z20822 Contact with and (suspected) exposure to covid-19: Secondary | ICD-10-CM | POA: Diagnosis not present

## 2020-01-15 DIAGNOSIS — Z888 Allergy status to other drugs, medicaments and biological substances status: Secondary | ICD-10-CM | POA: Diagnosis not present

## 2020-01-15 DIAGNOSIS — Z7982 Long term (current) use of aspirin: Secondary | ICD-10-CM | POA: Diagnosis not present

## 2020-01-15 DIAGNOSIS — Z79899 Other long term (current) drug therapy: Secondary | ICD-10-CM | POA: Diagnosis not present

## 2020-01-15 DIAGNOSIS — F0390 Unspecified dementia without behavioral disturbance: Secondary | ICD-10-CM | POA: Diagnosis not present

## 2020-01-15 DIAGNOSIS — F419 Anxiety disorder, unspecified: Secondary | ICD-10-CM | POA: Diagnosis not present

## 2020-01-15 DIAGNOSIS — Z96653 Presence of artificial knee joint, bilateral: Secondary | ICD-10-CM | POA: Diagnosis not present

## 2020-01-15 DIAGNOSIS — I10 Essential (primary) hypertension: Secondary | ICD-10-CM | POA: Diagnosis not present

## 2020-01-15 DIAGNOSIS — Z8249 Family history of ischemic heart disease and other diseases of the circulatory system: Secondary | ICD-10-CM | POA: Diagnosis not present

## 2020-01-15 DIAGNOSIS — K219 Gastro-esophageal reflux disease without esophagitis: Secondary | ICD-10-CM | POA: Diagnosis not present

## 2020-01-15 DIAGNOSIS — K8010 Calculus of gallbladder with chronic cholecystitis without obstruction: Secondary | ICD-10-CM | POA: Diagnosis present

## 2020-01-15 DIAGNOSIS — D649 Anemia, unspecified: Secondary | ICD-10-CM | POA: Diagnosis not present

## 2020-01-15 LAB — SARS CORONAVIRUS 2 (TAT 6-24 HRS): SARS Coronavirus 2: NEGATIVE

## 2020-01-17 ENCOUNTER — Ambulatory Visit: Payer: Medicare PPO | Admitting: Anesthesiology

## 2020-01-17 ENCOUNTER — Encounter
Admission: RE | Disposition: A | Discharge status: Home / Self Care | Payer: Self-pay | Source: Home / Self Care | Attending: Surgery

## 2020-01-17 ENCOUNTER — Ambulatory Visit: Payer: Medicare PPO

## 2020-01-17 ENCOUNTER — Encounter: Payer: Self-pay | Admitting: Surgery

## 2020-01-17 ENCOUNTER — Ambulatory Visit
Admission: RE | Admit: 2020-01-17 | Discharge: 2020-01-17 | Disposition: A | Discharge status: Home / Self Care | Payer: Medicare PPO | Attending: Surgery | Admitting: Surgery

## 2020-01-17 ENCOUNTER — Other Ambulatory Visit: Payer: Self-pay

## 2020-01-17 DIAGNOSIS — Z96653 Presence of artificial knee joint, bilateral: Secondary | ICD-10-CM | POA: Insufficient documentation

## 2020-01-17 DIAGNOSIS — K8010 Calculus of gallbladder with chronic cholecystitis without obstruction: Secondary | ICD-10-CM | POA: Diagnosis not present

## 2020-01-17 DIAGNOSIS — I10 Essential (primary) hypertension: Secondary | ICD-10-CM | POA: Insufficient documentation

## 2020-01-17 DIAGNOSIS — F419 Anxiety disorder, unspecified: Secondary | ICD-10-CM | POA: Insufficient documentation

## 2020-01-17 DIAGNOSIS — Z419 Encounter for procedure for purposes other than remedying health state, unspecified: Secondary | ICD-10-CM

## 2020-01-17 DIAGNOSIS — K219 Gastro-esophageal reflux disease without esophagitis: Secondary | ICD-10-CM | POA: Insufficient documentation

## 2020-01-17 DIAGNOSIS — Z20822 Contact with and (suspected) exposure to covid-19: Secondary | ICD-10-CM | POA: Insufficient documentation

## 2020-01-17 DIAGNOSIS — F0390 Unspecified dementia without behavioral disturbance: Secondary | ICD-10-CM | POA: Insufficient documentation

## 2020-01-17 DIAGNOSIS — Z8249 Family history of ischemic heart disease and other diseases of the circulatory system: Secondary | ICD-10-CM | POA: Insufficient documentation

## 2020-01-17 DIAGNOSIS — K810 Acute cholecystitis: Secondary | ICD-10-CM

## 2020-01-17 DIAGNOSIS — Z7982 Long term (current) use of aspirin: Secondary | ICD-10-CM | POA: Insufficient documentation

## 2020-01-17 DIAGNOSIS — Z888 Allergy status to other drugs, medicaments and biological substances status: Secondary | ICD-10-CM | POA: Insufficient documentation

## 2020-01-17 DIAGNOSIS — E785 Hyperlipidemia, unspecified: Secondary | ICD-10-CM | POA: Insufficient documentation

## 2020-01-17 DIAGNOSIS — Z79899 Other long term (current) drug therapy: Secondary | ICD-10-CM | POA: Insufficient documentation

## 2020-01-17 DIAGNOSIS — K828 Other specified diseases of gallbladder: Secondary | ICD-10-CM | POA: Insufficient documentation

## 2020-01-17 DIAGNOSIS — D649 Anemia, unspecified: Secondary | ICD-10-CM | POA: Insufficient documentation

## 2020-01-17 SURGERY — CHOLECYSTECTOMY, ROBOT-ASSISTED, LAPAROSCOPIC
Anesthesia: General | Site: Abdomen

## 2020-01-17 MED ORDER — SODIUM CHLORIDE (PF) 0.9 % IJ SOLN
INTRAMUSCULAR | Status: AC
  Filled 2020-01-17: qty 50

## 2020-01-17 MED ORDER — SUGAMMADEX SODIUM 200 MG/2ML IV SOLN
INTRAVENOUS | Status: DC | PRN
  Administered 2020-01-17: 178.8 mg via INTRAVENOUS

## 2020-01-17 MED ORDER — SODIUM CHLORIDE 0.9 % IV SOLN
INTRAVENOUS | Status: DC | PRN
  Administered 2020-01-17: 100 mL

## 2020-01-17 MED ORDER — ACETAMINOPHEN 325 MG PO TABS: 650.0000 mg | ORAL_TABLET | Freq: Three times a day (TID) | ORAL | 0 refills | Status: AC | PRN

## 2020-01-17 MED ORDER — LIDOCAINE HCL (PF) 2 % IJ SOLN
INTRAMUSCULAR | Status: AC
  Filled 2020-01-17: qty 5

## 2020-01-17 MED ORDER — LACTATED RINGERS IV SOLN: INTRAVENOUS | Status: DC

## 2020-01-17 MED ORDER — CEFAZOLIN SODIUM-DEXTROSE 2-4 GM/100ML-% IV SOLN
INTRAVENOUS | Status: AC
  Filled 2020-01-17: qty 100

## 2020-01-17 MED ORDER — DOCUSATE SODIUM 100 MG PO CAPS: 100.0000 mg | ORAL_CAPSULE | Freq: Two times a day (BID) | ORAL | 0 refills | Status: AC | PRN

## 2020-01-17 MED ORDER — ROCURONIUM BROMIDE 10 MG/ML (PF) SYRINGE
PREFILLED_SYRINGE | INTRAVENOUS | Status: AC
  Filled 2020-01-17: qty 10

## 2020-01-17 MED ORDER — LIDOCAINE-EPINEPHRINE (PF) 1 %-1:200000 IJ SOLN
INTRAMUSCULAR | Status: DC | PRN
  Administered 2020-01-17: 7.5 mL

## 2020-01-17 MED ORDER — LIDOCAINE HCL (CARDIAC) PF 100 MG/5ML IV SOSY
PREFILLED_SYRINGE | INTRAVENOUS | Status: DC | PRN
  Administered 2020-01-17: 100 mg via INTRAVENOUS

## 2020-01-17 MED ORDER — CHLORHEXIDINE GLUCONATE CLOTH 2 % EX PADS: 6.0000 | MEDICATED_PAD | Freq: Once | CUTANEOUS | Status: DC

## 2020-01-17 MED ORDER — PROPOFOL 10 MG/ML IV BOLUS
INTRAVENOUS | Status: AC
  Filled 2020-01-17: qty 20

## 2020-01-17 MED ORDER — FAMOTIDINE 20 MG PO TABS: 20.0000 mg | ORAL_TABLET | Freq: Once | ORAL | Status: AC

## 2020-01-17 MED ORDER — HYDROCODONE-ACETAMINOPHEN 5-325 MG PO TABS: 1.0000 | ORAL_TABLET | Freq: Four times a day (QID) | ORAL | 0 refills | Status: DC | PRN

## 2020-01-17 MED ORDER — DEXAMETHASONE SODIUM PHOSPHATE 10 MG/ML IJ SOLN
INTRAMUSCULAR | Status: DC | PRN
  Administered 2020-01-17: 4 mg via INTRAVENOUS

## 2020-01-17 MED ORDER — BUPIVACAINE HCL (PF) 0.5 % IJ SOLN
INTRAMUSCULAR | Status: DC | PRN
  Administered 2020-01-17: 7.5 mL

## 2020-01-17 MED ORDER — ESMOLOL HCL 100 MG/10ML IV SOLN
INTRAVENOUS | Status: DC | PRN
  Administered 2020-01-17: 10 ug via INTRAVENOUS
  Administered 2020-01-17: 20 ug via INTRAVENOUS
  Administered 2020-01-17: 10 ug via INTRAVENOUS

## 2020-01-17 MED ORDER — INDOCYANINE GREEN 25 MG IV SOLR
1.2500 mg | Freq: Once | INTRAVENOUS | Status: AC
  Administered 2020-01-17: 10:00:00 1.25 mg via INTRAVENOUS
  Filled 2020-01-17: qty 10

## 2020-01-17 MED ORDER — ONDANSETRON HCL 4 MG/2ML IJ SOLN
INTRAMUSCULAR | Status: DC | PRN
  Administered 2020-01-17: 4 mg via INTRAVENOUS

## 2020-01-17 MED ORDER — ROCURONIUM BROMIDE 100 MG/10ML IV SOLN
INTRAVENOUS | Status: DC | PRN
  Administered 2020-01-17 (×2): 10 mg via INTRAVENOUS
  Administered 2020-01-17: 50 mg via INTRAVENOUS
  Administered 2020-01-17 (×2): 10 mg via INTRAVENOUS

## 2020-01-17 MED ORDER — FENTANYL CITRATE (PF) 100 MCG/2ML IJ SOLN: 25.0000 ug | INTRAMUSCULAR | Status: DC | PRN

## 2020-01-17 MED ORDER — FAMOTIDINE 20 MG PO TABS
ORAL_TABLET | ORAL | Status: AC
  Administered 2020-01-17: 20 mg via ORAL
  Filled 2020-01-17: qty 1

## 2020-01-17 MED ORDER — CEFAZOLIN SODIUM-DEXTROSE 2-4 GM/100ML-% IV SOLN
2.0000 g | INTRAVENOUS | Status: AC
  Administered 2020-01-17: 13:00:00 2 g via INTRAVENOUS

## 2020-01-17 MED ORDER — FENTANYL CITRATE (PF) 100 MCG/2ML IJ SOLN
INTRAMUSCULAR | Status: DC | PRN
  Administered 2020-01-17 (×2): 50 ug via INTRAVENOUS

## 2020-01-17 MED ORDER — FENTANYL CITRATE (PF) 100 MCG/2ML IJ SOLN
INTRAMUSCULAR | Status: AC
  Filled 2020-01-17: qty 2

## 2020-01-17 MED ORDER — PROPOFOL 10 MG/ML IV BOLUS
INTRAVENOUS | Status: DC | PRN
  Administered 2020-01-17: 100 mg via INTRAVENOUS

## 2020-01-17 MED ORDER — IBUPROFEN 400 MG PO TABS: 400.0000 mg | ORAL_TABLET | Freq: Three times a day (TID) | ORAL | 0 refills | Status: DC | PRN

## 2020-01-17 MED ORDER — ESMOLOL HCL 100 MG/10ML IV SOLN
INTRAVENOUS | Status: AC
  Filled 2020-01-17: qty 10

## 2020-01-17 MED ORDER — ONDANSETRON HCL 4 MG/2ML IJ SOLN: 4.0000 mg | Freq: Once | INTRAMUSCULAR | Status: DC | PRN

## 2020-01-17 SURGICAL SUPPLY — 59 items
"PENCIL ELECTRO HAND CTR " (MISCELLANEOUS) ×2 IMPLANT
ANCHOR TIS RET SYS 235ML (MISCELLANEOUS) ×4 IMPLANT
BAG INFUSER PRESSURE 100CC (MISCELLANEOUS) ×2 IMPLANT
BLADE SURG SZ11 CARB STEEL (BLADE) ×4 IMPLANT
CANISTER SUCT 1200ML W/VALVE (MISCELLANEOUS) ×4 IMPLANT
CANNULA REDUC XI 12-8 STAPL (CANNULA) ×1
CANNULA REDUC XI 12-8MM STAPL (CANNULA) ×1
CANNULA REDUCER 12-8 DVNC XI (CANNULA) ×2 IMPLANT
CATH CHOLANGI 4FR 420404F (CATHETERS) ×2 IMPLANT
CATH REDDICK CHOLANGI 4FR 50CM (CATHETERS) ×2 IMPLANT
CHLORAPREP W/TINT 26 (MISCELLANEOUS) ×4 IMPLANT
CLIP VESOLOCK MED LG 6/CT (CLIP) ×4 IMPLANT
COVER TIP SHEARS 8 DVNC (MISCELLANEOUS) ×2 IMPLANT
COVER TIP SHEARS 8MM DA VINCI (MISCELLANEOUS) ×2
COVER WAND RF STERILE (DRAPES) ×4 IMPLANT
DECANTER SPIKE VIAL GLASS SM (MISCELLANEOUS) ×4 IMPLANT
DEFOGGER SCOPE WARMER CLEARIFY (MISCELLANEOUS) ×4 IMPLANT
DERMABOND ADVANCED (GAUZE/BANDAGES/DRESSINGS) ×2
DERMABOND ADVANCED .7 DNX12 (GAUZE/BANDAGES/DRESSINGS) ×2 IMPLANT
DRAPE ARM DVNC X/XI (DISPOSABLE) ×8 IMPLANT
DRAPE COLUMN DVNC XI (DISPOSABLE) ×2 IMPLANT
DRAPE DA VINCI XI ARM (DISPOSABLE) ×8
DRAPE DA VINCI XI COLUMN (DISPOSABLE) ×2
ELECT CAUTERY BLADE 6.4 (BLADE) ×4 IMPLANT
ELECT REM PT RETURN 9FT ADLT (ELECTROSURGICAL) ×4
ELECTRODE REM PT RTRN 9FT ADLT (ELECTROSURGICAL) ×2 IMPLANT
GLOVE BIOGEL PI IND STRL 7.0 (GLOVE) ×4 IMPLANT
GLOVE BIOGEL PI INDICATOR 7.0 (GLOVE) ×4
GLOVE SURG SYN 6.5 ES PF (GLOVE) ×8 IMPLANT
GLOVE SURG SYN 6.5 PF PI (GLOVE) ×4 IMPLANT
GOWN STRL REUS W/ TWL LRG LVL3 (GOWN DISPOSABLE) ×6 IMPLANT
GOWN STRL REUS W/TWL LRG LVL3 (GOWN DISPOSABLE) ×6
GRASPER SUT TROCAR 14GX15 (MISCELLANEOUS) IMPLANT
IRRIGATOR SUCT 8 DISP DVNC XI (IRRIGATION / IRRIGATOR) IMPLANT
IRRIGATOR SUCTION 8MM XI DISP (IRRIGATION / IRRIGATOR) ×2
IV CATH ANGIO 12GX3 LT BLUE (NEEDLE) ×2 IMPLANT
IV NS 1000ML (IV SOLUTION)
IV NS 1000ML BAXH (IV SOLUTION) IMPLANT
LABEL OR SOLS (LABEL) ×4 IMPLANT
NDL INSUFFLATION 14GA 120MM (NEEDLE) ×2 IMPLANT
NEEDLE HYPO 22GX1.5 SAFETY (NEEDLE) ×4 IMPLANT
NEEDLE INSUFFLATION 14GA 120MM (NEEDLE) ×4 IMPLANT
NS IRRIG 500ML POUR BTL (IV SOLUTION) ×4 IMPLANT
OBTURATOR OPTICAL STANDARD 8MM (TROCAR) ×2
OBTURATOR OPTICAL STND 8 DVNC (TROCAR) ×2
OBTURATOR OPTICALSTD 8 DVNC (TROCAR) ×2 IMPLANT
PACK LAP CHOLECYSTECTOMY (MISCELLANEOUS) ×4 IMPLANT
PENCIL ELECTRO HAND CTR (MISCELLANEOUS) ×4 IMPLANT
SEAL CANN UNIV 5-8 DVNC XI (MISCELLANEOUS) ×6 IMPLANT
SEAL XI 5MM-8MM UNIVERSAL (MISCELLANEOUS) ×6
SET TUBE SMOKE EVAC HIGH FLOW (TUBING) ×4 IMPLANT
SOLUTION ELECTROLUBE (MISCELLANEOUS) ×4 IMPLANT
STAPLER CANNULA SEAL DVNC XI (STAPLE) ×2 IMPLANT
STAPLER CANNULA SEAL XI (STAPLE) ×2
SUT MNCRL 4-0 (SUTURE) ×4
SUT MNCRL 4-0 27XMFL (SUTURE) ×4
SUT VICRYL 0 AB UR-6 (SUTURE) ×4 IMPLANT
SUTURE MNCRL 4-0 27XMF (SUTURE) ×4 IMPLANT
SYSTEM WECK SHIELD CLOSURE (TROCAR) ×2 IMPLANT

## 2020-01-17 NOTE — Transfer of Care (Signed)
Immediate Anesthesia Transfer of Care Note  Patient: Danny Brady  Procedure(s) Performed: XI ROBOTIC ASSISTED LAPAROSCOPIC CHOLECYSTECTOMY WITH INTRAOPERATIVE CHOLANGIOGRAM (N/A Abdomen) INDOCYANINE GREEN FLUORESCENCE IMAGING (ICG)  Patient Location: PACU  Anesthesia Type:General  Level of Consciousness: sedated  Airway & Oxygen Therapy: Patient Spontanous Breathing and Patient connected to face mask oxygen  Post-op Assessment: Report given to RN and Post -op Vital signs reviewed and stable  Post vital signs: Reviewed and stable  Last Vitals:  Vitals Value Taken Time  BP 126/67 01/17/20 1527  Temp 35.7 C 01/17/20 1527  Pulse 78 01/17/20 1531  Resp 19 01/17/20 1531  SpO2 100 % 01/17/20 1531  Vitals shown include unvalidated device data.  Last Pain:  Vitals:   01/17/20 1527  TempSrc:   PainSc: Asleep         Complications: No apparent anesthesia complications

## 2020-01-17 NOTE — Anesthesia Postprocedure Evaluation (Signed)
Anesthesia Post Note  Patient: Danny Brady  Procedure(s) Performed: XI ROBOTIC ASSISTED LAPAROSCOPIC CHOLECYSTECTOMY WITH INTRAOPERATIVE CHOLANGIOGRAM (N/A Abdomen) INDOCYANINE GREEN FLUORESCENCE IMAGING (ICG)  Patient location during evaluation: PACU Anesthesia Type: General Level of consciousness: awake and alert Pain management: pain level controlled Vital Signs Assessment: post-procedure vital signs reviewed and stable Respiratory status: spontaneous breathing, nonlabored ventilation and respiratory function stable Cardiovascular status: blood pressure returned to baseline and stable Postop Assessment: no apparent nausea or vomiting Anesthetic complications: no     Last Vitals:  Vitals:   01/17/20 1557 01/17/20 1610  BP: 130/78 130/88  Pulse: 83 72  Resp: 13 17  Temp:  (!) 36.2 C  SpO2: 95% 95%    Last Pain:  Vitals:   01/17/20 1610  TempSrc:   PainSc: 2                  Karleen Hampshire

## 2020-01-17 NOTE — Anesthesia Procedure Notes (Signed)
Procedure Name: Intubation Date/Time: 01/17/2020 12:47 PM Performed by: Babs Sciara, CRNA Pre-anesthesia Checklist: Patient identified, Emergency Drugs available, Suction available, Patient being monitored and Timeout performed Patient Re-evaluated:Patient Re-evaluated prior to induction Oxygen Delivery Method: Circle system utilized Preoxygenation: Pre-oxygenation with 100% oxygen Induction Type: IV induction Ventilation: Mask ventilation without difficulty Laryngoscope Size: Mac and 4 Grade View: Grade I Tube type: Oral Tube size: 7.5 mm Number of attempts: 1 Airway Equipment and Method: Stylet Placement Confirmation: ETT inserted through vocal cords under direct vision,  positive ETCO2 and breath sounds checked- equal and bilateral Secured at: 20 (lip) cm Tube secured with: Tape Dental Injury: Teeth and Oropharynx as per pre-operative assessment

## 2020-01-17 NOTE — OR Nursing (Signed)
Per Dr. Tonna Boehringer secure chat, pt may resume aspirin in 72 hours.   Added to medication section on discharge instructions.

## 2020-01-17 NOTE — Discharge Instructions (Signed)

## 2020-01-17 NOTE — Interval H&P Note (Signed)
History and Physical Interval Note:  01/17/2020 12:04 PM  Danny Brady  has presented today for surgery, with the diagnosis of R79.89 -  Elevated LFT's.  The various methods of treatment have been discussed with the patient and family. After consideration of risks, benefits and other options for treatment, the patient has consented to  Procedure(s): XI ROBOTIC ASSISTED LAPAROSCOPIC CHOLECYSTECTOMY WITH INTRAOPERATIVE CHOLANGIOGRAM (N/A) as a surgical intervention.  The patient's history has been reviewed, patient examined, no change in status, stable for surgery.  I have reviewed the patient's chart and labs.  Questions were answered to the patient's satisfaction.     Rooney Swails Tonna Boehringer

## 2020-01-17 NOTE — Anesthesia Preprocedure Evaluation (Addendum)
Anesthesia Evaluation  Patient identified by MRN, date of birth, ID band Patient awake    Reviewed: Allergy & Precautions, NPO status , Patient's Chart, lab work & pertinent test results  History of Anesthesia Complications Negative for: history of anesthetic complications  Airway Mallampati: II  TM Distance: >3 FB Neck ROM: Full    Dental  (+) Partial Lower, Partial Upper   Pulmonary neg pulmonary ROS, neg sleep apnea, neg COPD,    breath sounds clear to auscultation- rhonchi (-) wheezing      Cardiovascular hypertension, Pt. on medications +CHF (EF 45%)  (-) CAD, (-) Past MI, (-) Cardiac Stents and (-) CABG  Rhythm:Regular Rate:Normal - Systolic murmurs and - Diastolic murmurs    Neuro/Psych neg Seizures Anxiety negative neurological ROS     GI/Hepatic Neg liver ROS, GERD  ,  Endo/Other  negative endocrine ROSneg diabetes  Renal/GU negative Renal ROS     Musculoskeletal negative musculoskeletal ROS (+)   Abdominal (+) - obese,   Peds  Hematology  (+) anemia ,   Anesthesia Other Findings Past Medical History: No date: Anemia No date: Anxiety No date: Edema No date: GERD (gastroesophageal reflux disease) No date: Hypertension   Reproductive/Obstetrics                            Anesthesia Physical Anesthesia Plan  ASA: III  Anesthesia Plan: General   Post-op Pain Management:    Induction: Intravenous  PONV Risk Score and Plan: 1 and Ondansetron and Dexamethasone  Airway Management Planned: Oral ETT  Additional Equipment:   Intra-op Plan:   Post-operative Plan: Extubation in OR  Informed Consent: I have reviewed the patients History and Physical, chart, labs and discussed the procedure including the risks, benefits and alternatives for the proposed anesthesia with the patient or authorized representative who has indicated his/her understanding and acceptance.      Dental advisory given  Plan Discussed with: CRNA and Anesthesiologist  Anesthesia Plan Comments:         Anesthesia Quick Evaluation

## 2020-01-17 NOTE — Op Note (Signed)
Preoperative diagnosis: Abnormal LFTs  Postoperative diagnosis: same as above plus acute cholecystitis  Procedure: Robotic assisted Laparoscopic Cholecystectomy and intraoperative cholangiogram  Anesthesia: GETA   Surgeon: Sung Amabile  Specimen: Gallbladder  Complications: None  EBL: 31mL  Wound Classification: Clean Contaminated  Indications: see HPI  Findings: Critical view of safety noted Intraoperative cholangiogram showed no evidence of choledocholithiasis Cystic duct and artery identified, ligated and divided, clips remained intact at end of procedure Adequate hemostasis  Description of procedure:  The patient was placed on the operating table in the supine position. SCDs placed, pre-op abx administered.  General anesthesia was induced and OG tube placed by anesthesia. A time-out was completed verifying correct patient, procedure, site, positioning, and implant(s) and/or special equipment prior to beginning this procedure. The abdomen was prepped and draped in the usual sterile fashion.    Veress needle was placed at the Palmer's point and insufflation was started after confirming a positive saline drop test and no immediate increase in abdominal pressure.  After reaching 15 mm, the Veress needle was removed and a 8 mm port was placed via optiview technique under umbilicus measured 69mm from gallbladder.  The abdomen was inspected and no abnormalities or injuries were found.  Under direct vision, ports were placed in the following locations: One 12 mm patient left of the umbilicus, 8cm from the optiviewed port, one 8 mm port placed to the patient right of the umbilical port 8 cm apart.  1 additional 8 mm port placed lateral to the 33mm port.  Once ports were placed, The table was placed in the reverse Trendelenburg position with the right side up.  Laparoscopic graspers were then used to identify the gallbladder and elevated in order to position the 18-gauge Angiocath in the  appropriate position in the right upper quadrant.  The Xi platform was brought into the operative field and docked to the ports successfully.  An endoscope was placed through the umbilical port, fenestrated grasper through the adjacent patient right port, prograsp to the far patient left port, and then a hook cautery in the left port.  The dome of the gallbladder was grasped with prograsp, passed and retracted over the dome of the liver. Adhesions between the gallbladder and omentum, duodenum and transverse colon were lysed via hook cautery. The infundibulum was grasped with the fenestrated grasper and retracted toward the right lower quadrant. This maneuver exposed Calot's triangle. The peritoneum overlying the gallbladder infundibulum was then dissected using combination of [place] dissector and electrocautery hook and the cystic duct and cystic artery identified.  Critical view of safety with the liver bed clearly visible behind the duct and artery with no additional structures noted.  One clip was placed on the cystic duct posterior to the gallbladder and then a small cut was made in the cystic duct.  Cholangiogram catheter was then placed through the previously inserted Angiocath into the duct itself and the balloon was expanded to 3 cc to prevent leakage of the contrast.  Saline was infused to confirm no leakage prior to removing all the robotic arms and undocking the robot to make room for the C arm.  My failure with injection of contrast showed a patent common bile duct with no evidence of choledocholithiasis, and adequate visualization of both hepatic ducts.  C-arm was removed away from the operating table and the robot was brought back in and we documented original position.  Cholangiogram catheter along with Angiocath was then removed.   The cystic duct and cystic  artery clipped and divided close to the gallbladder.   The gallbladder was then dissected from its peritoneal and liver bed attachments  by electrocautery. Hemostasis was checked prior to removing the hook cautery and the Endo Catch bag was then placed through the 12 mm port and the gallbladder was removed.  The gallbladder was passed off the table as a specimen. There was no evidence of bleeding from the gallbladder fossa or cystic artery or leakage of the bile from the cystic duct stump. The 12 mm port site closed with PMI using 0 vicryl under direct vision.  Abdomen desufflated and secondary trocars were removed under direct vision. No bleeding was noted. All skin incisions then closed with subcuticular sutures of 4-0 monocryl and dressed with topical skin adhesive. The orogastric tube was removed and patient extubated.  The patient tolerated the procedure well and was taken to the postanesthesia care unit in stable condition.  All sponge and instrument count correct at end of procedure.

## 2020-01-21 LAB — SURGICAL PATHOLOGY

## 2022-01-14 IMAGING — CT CT HIP*R* W/O CM
2 of 3 series · 17 of 46 positions shown, 19 images · non-contrast
Comparison: Radiographs same date.

CLINICAL DATA: Fall today with right hip pain and swelling.
Intertrochanteric right femur fracture.

EXAM:
CT OF THE RIGHT HIP WITHOUT CONTRAST
TECHNIQUE: Multidetector CT imaging of the right hip was performed according to
the standard protocol. Multiplanar CT image reconstructions were
also generated.

[Series 3: axial st · axial · 0.50mm/px · z∈[-864,-668]mm · 14 of 114 slices shown, 16 images]
[im 8/114  soft-tissue]
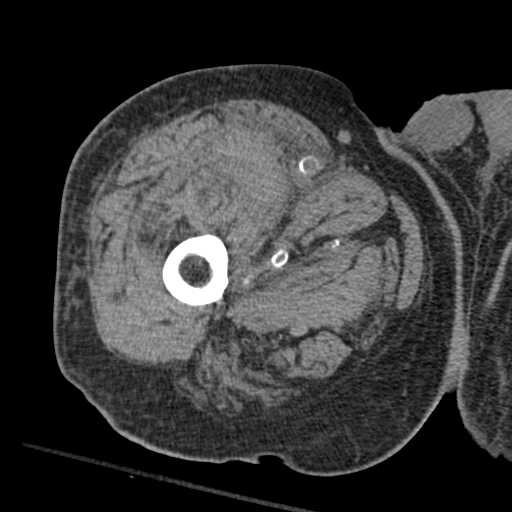
[im 8/114  bone]
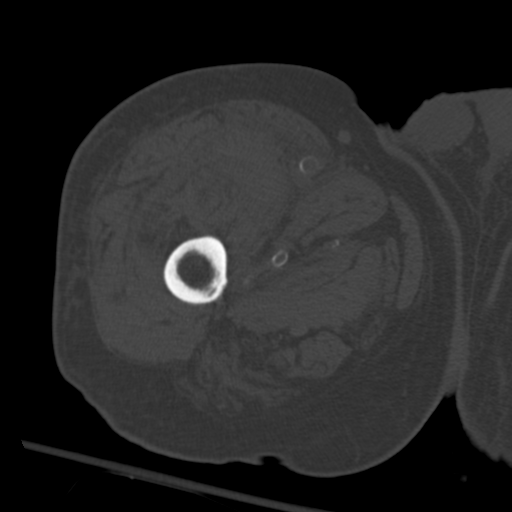
[im 15/114  soft-tissue]
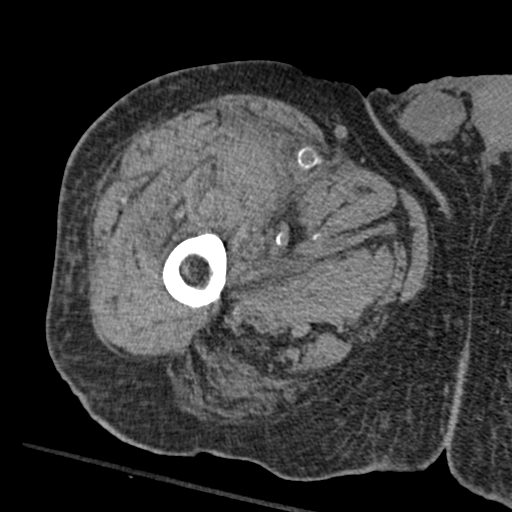
[im 22/114  soft-tissue]
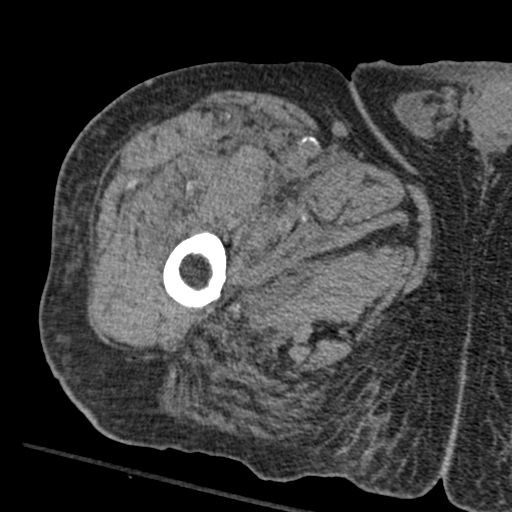
[im 30/114  soft-tissue]
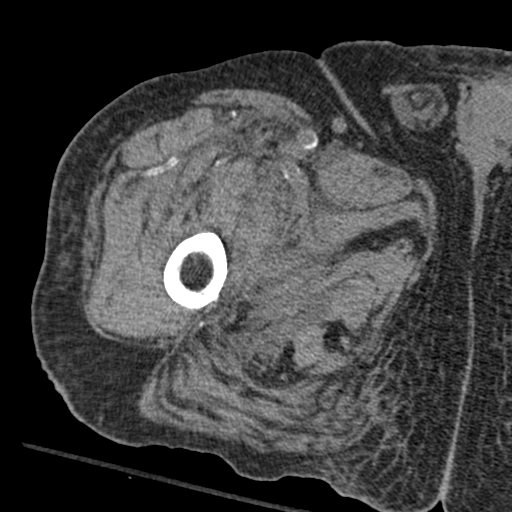
[im 37/114  soft-tissue]
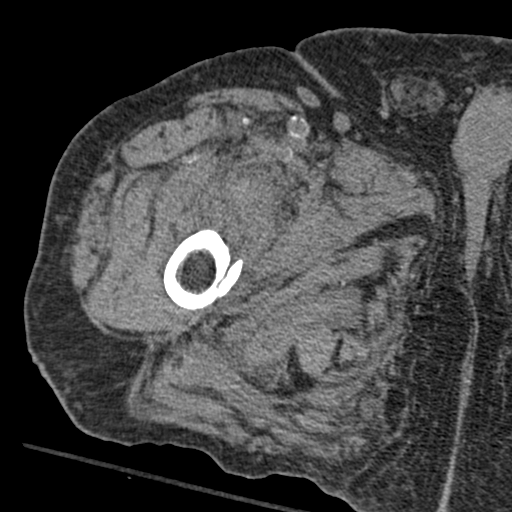
[im 44/114  soft-tissue]
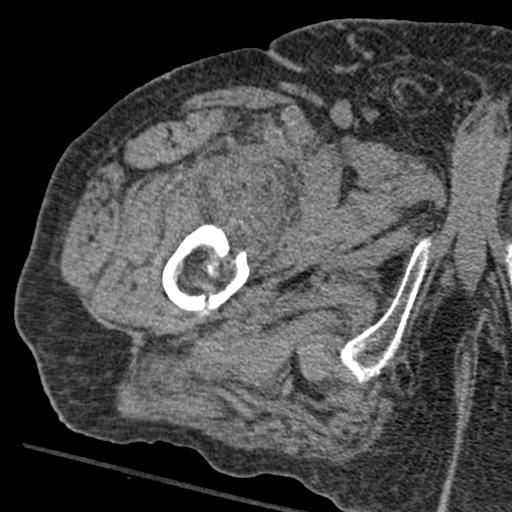
[im 52/114  soft-tissue]
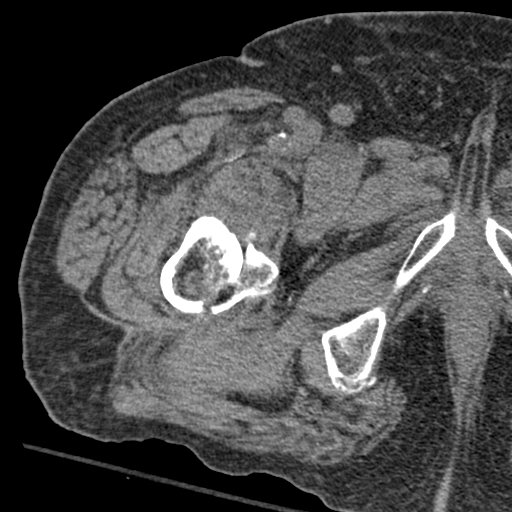
[im 62/114  soft-tissue]
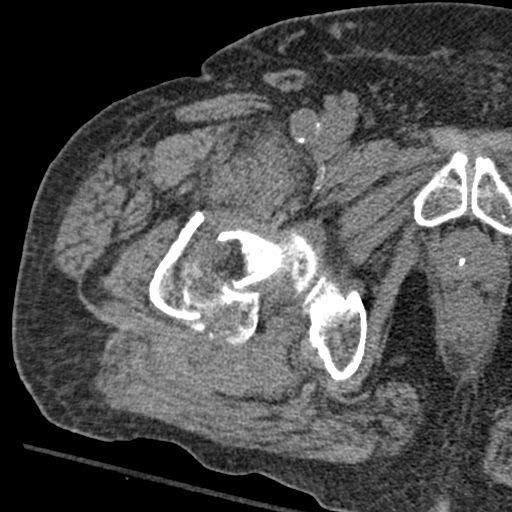
[im 70/114  soft-tissue]
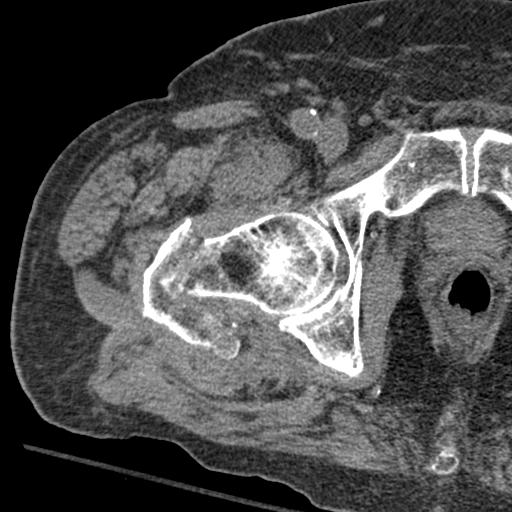
[im 70/114  bone]
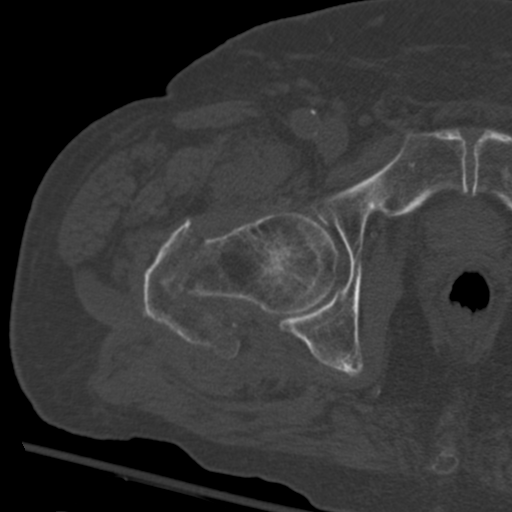
[im 77/114  soft-tissue]
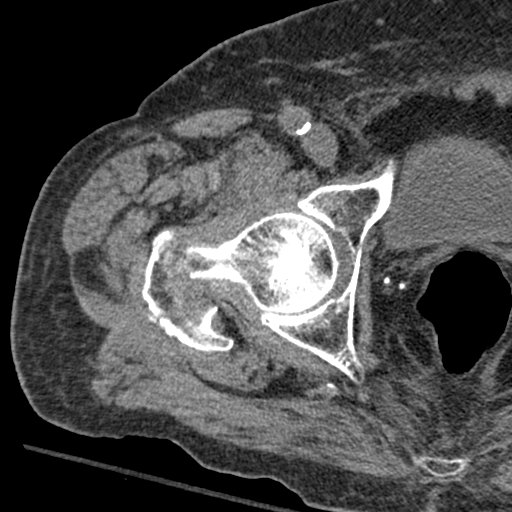
[im 84/114  soft-tissue]
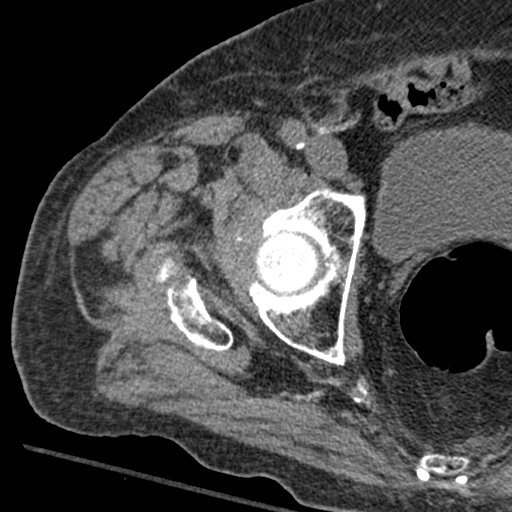
[im 92/114  soft-tissue]
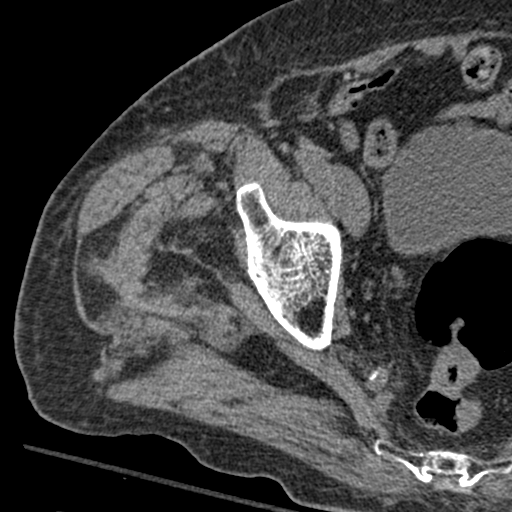
[im 99/114  soft-tissue]
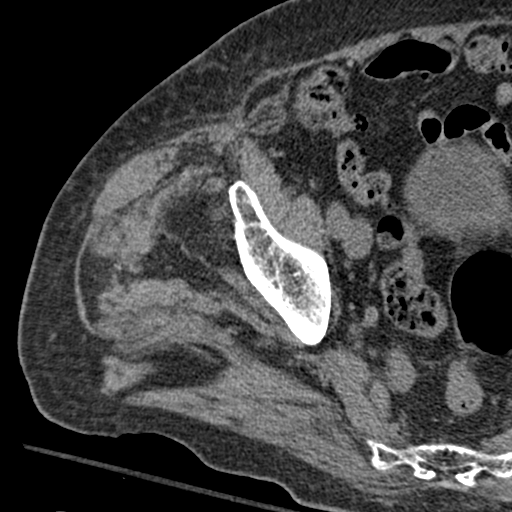
[im 106/114  soft-tissue]
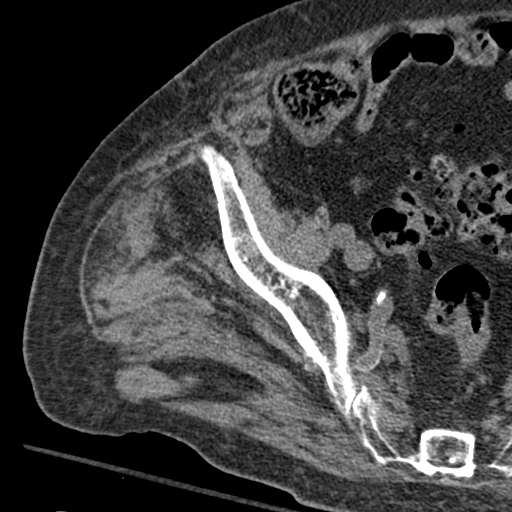

[Series 8: coronal st · coronal · 0.49mm/px · 3 of 104 slices shown]
[im 35/104  soft-tissue]
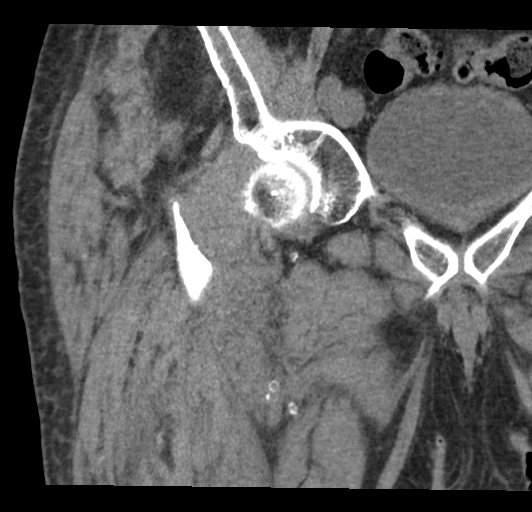
[im 46/104  soft-tissue]
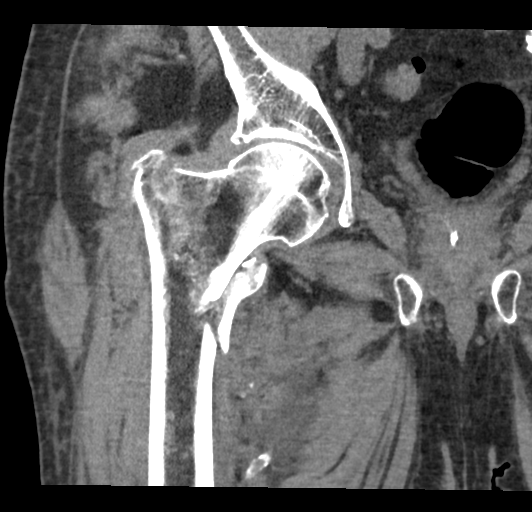
[im 58/104  soft-tissue]
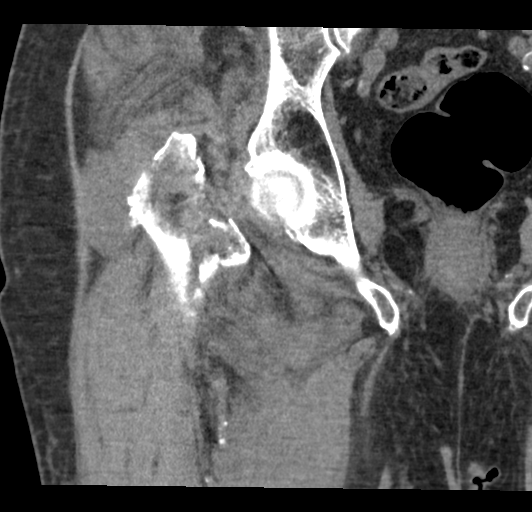

[17 of 46 positions shown; findings below may reference images not displayed]

FINDINGS: Bones/Joint/Cartilage

Comminuted intertrochanteric right femur fracture is associated with
mild superior displacement and varus angulation. The lesser
trochanter is displaced medially by less than 1 cm. There is no
significant extension of the fracture into the femoral neck or
femoral head. The femoral head is located. No evidence of
dislocation of the visualized right hemipelvis. Mild underlying
right hip degenerative changes with a small right hip joint
effusion.

Ligaments

Suboptimally assessed by CT.

Muscles and Tendons

Mild atrophy of the gluteus musculature. No evident tendon tear.

Soft tissues

No focal periarticular hematoma. There is mild edema within the
subcutaneous fat anterolateral to the right hip. Iliofemoral
atherosclerosis and mild sigmoid diverticulosis are noted.
IMPRESSION: Comminuted intertrochanteric right femur fracture as described.
Underlying mild right hip degenerative changes.

## 2022-01-14 IMAGING — CT CT HEAD W/O CM
3 series · 15 of 47 positions shown, 18 images · non-contrast
Comparison: 07/25/2018

CLINICAL DATA: Pt presents to ED via [facility] with c/o fall. Per EMS
pt was putting his pants on when he fell onto the floor. Per EMS pt
with outward rotation baseline, however rotation worse after fall,
and shortening to R leg, swelling is baseli.*comment was
truncated*Headache, post traumatic

EXAM:
CT HEAD WITHOUT CONTRAST
TECHNIQUE: Contiguous axial images were obtained from the base of the skull
through the vertex without intravenous contrast.

[Series 2: head wo · axial · 0.42mm/px · z∈[-81,+44]mm · 9 of 31 slices shown, 12 images]
[im 3/31  brain]
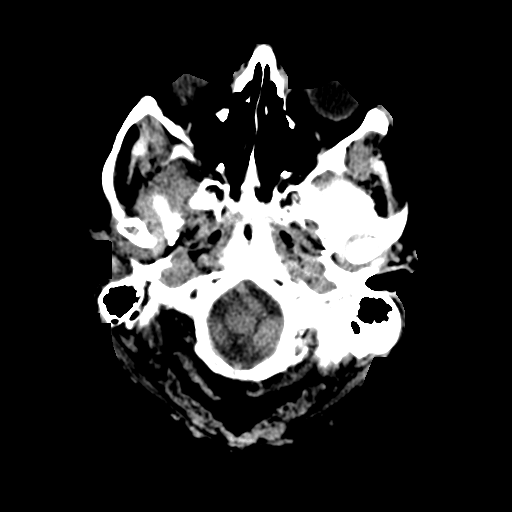
[im 3/31  bone]
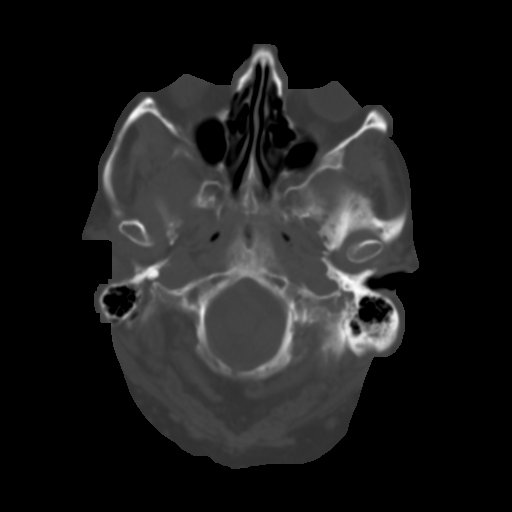
[im 6/31  brain]
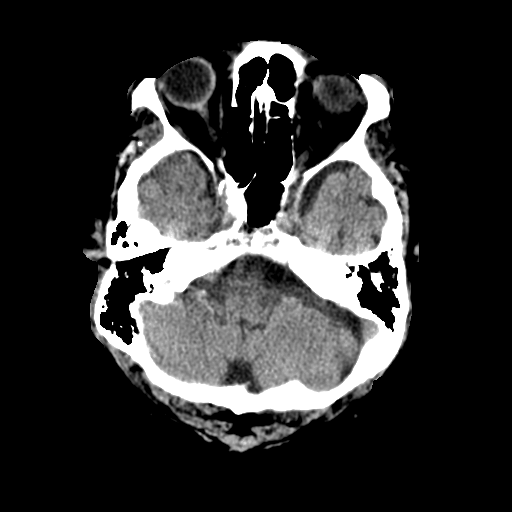
[im 9/31  brain]
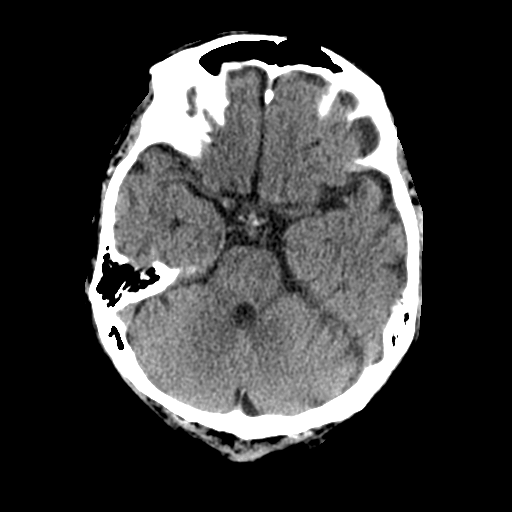
[im 12/31  brain]
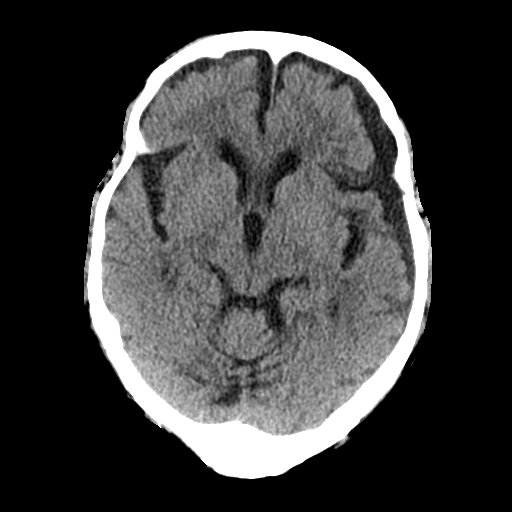
[im 16/31  brain]
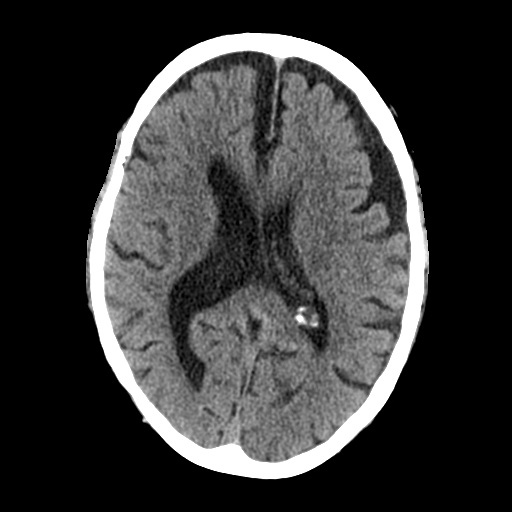
[im 16/31  bone]
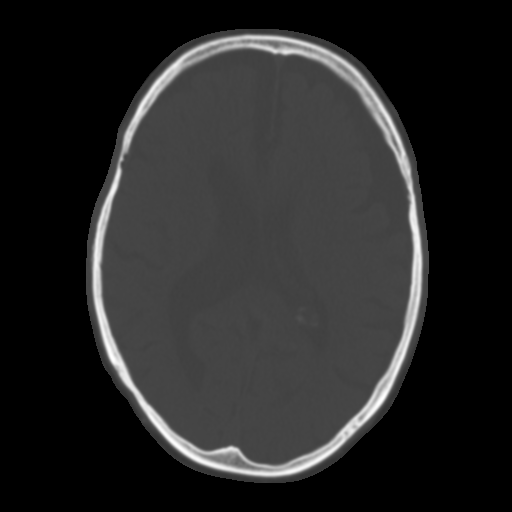
[im 19/31  brain]
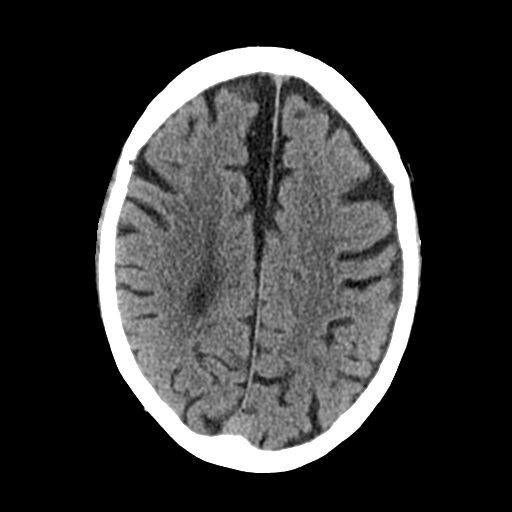
[im 22/31  brain]
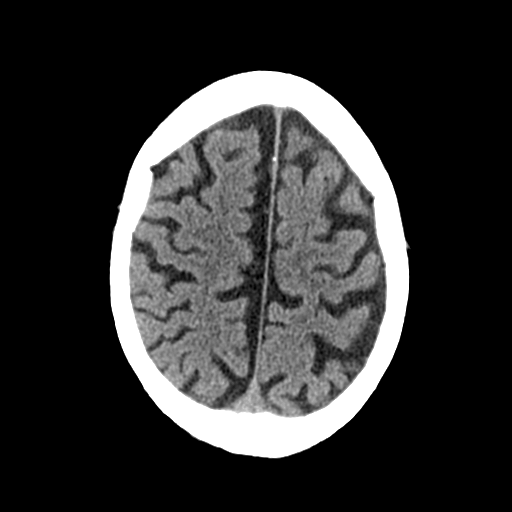
[im 25/31  brain]
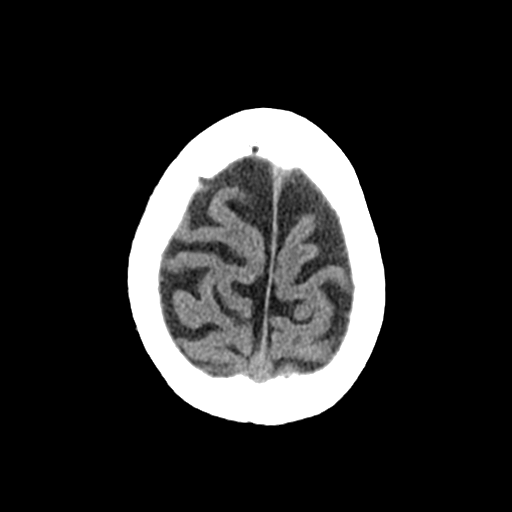
[im 28/31  brain]
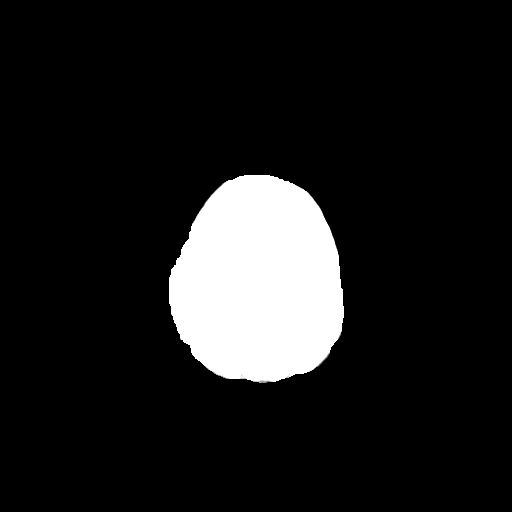
[im 28/31  bone]
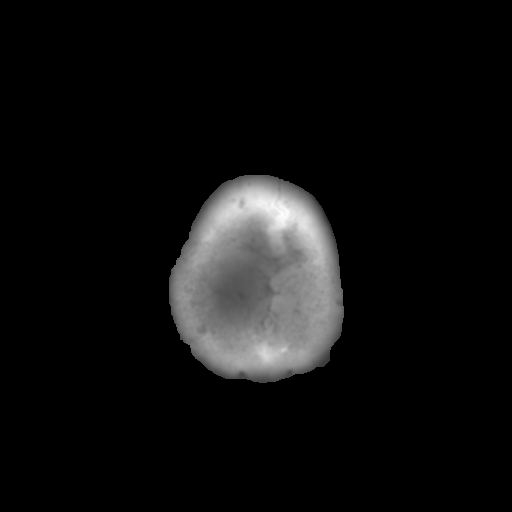

[Series 4: coronal soft tissue · coronal · 0.31mm/px · 3 of 67 slices shown]
[im 23/67  brain]
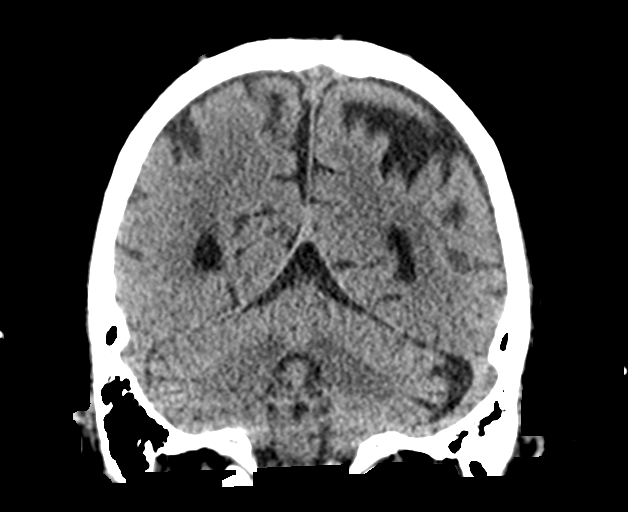
[im 30/67  brain]
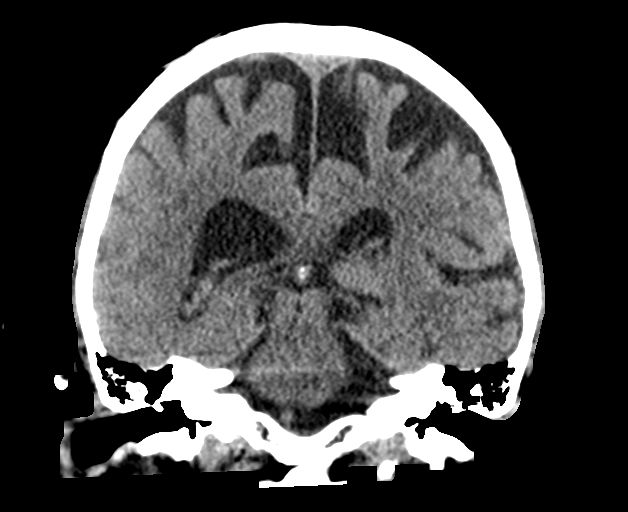
[im 37/67  brain]
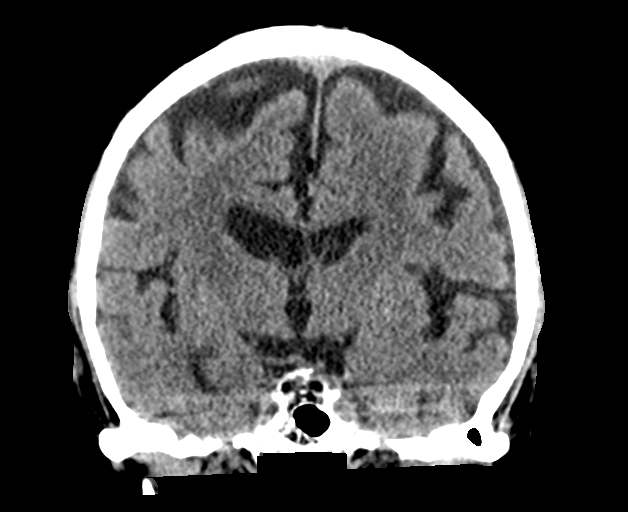

[Series 5: sagittal soft tissue · sagittal · 0.31mm/px · 3 of 54 slices shown]
[im 18/54  brain]
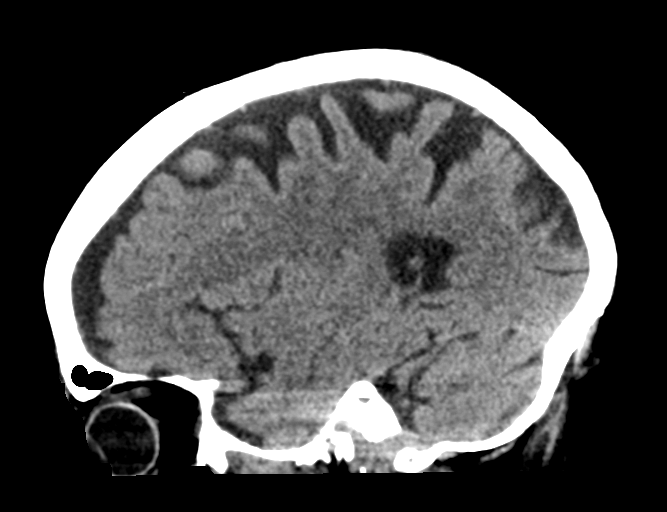
[im 27/54  brain]
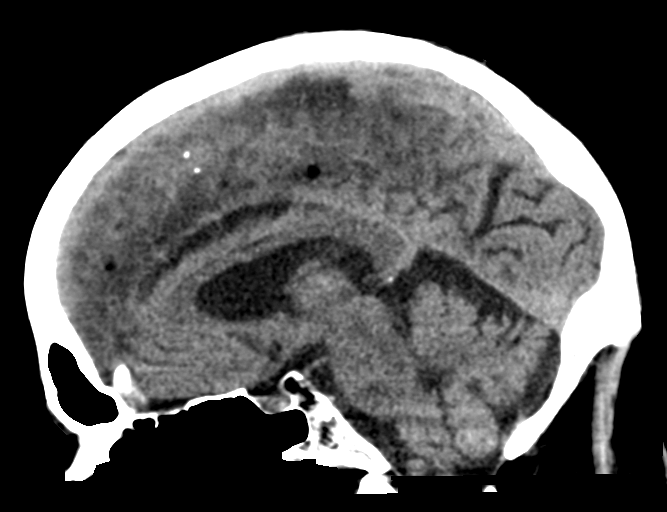
[im 36/54  brain]
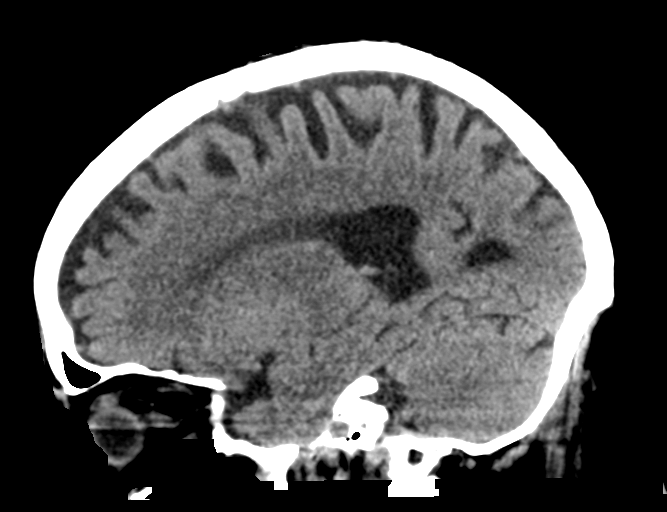

[15 of 47 positions shown; findings below may reference images not displayed]

FINDINGS: Brain: No acute intracranial hemorrhage. No focal mass lesion. No CT
evidence of acute infarction. No midline shift or mass effect. No
hydrocephalus. Basilar cisterns are patent.

Chronic low-density extra-axial fluid collection over the LEFT
frontal lobe not changed from prior.

There are periventricular and subcortical white matter
hypodensities. Generalized cortical atrophy.

Vascular: No hyperdense vessel or unexpected calcification.

Skull: Normal. Negative for fracture or focal lesion.

Sinuses/Orbits: Paranasal sinuses and mastoid air cells are clear.
Orbits are clear.

Other: None.
IMPRESSION: 1. No acute intracranial trauma. No change from prior.
2. White matter microvascular disease and atrophy.
3. Chronic subdural hygroma over the LEFT frontal lobe.

## 2022-01-14 IMAGING — CR DG CHEST 1V
1 series · 2 of 2 positions shown · non-contrast
Comparison: Chest x-ray 05/13/2018

CLINICAL DATA: Fell. Right hip pain. Suspected fracture.

EXAM:
CHEST  1 VIEW

[Series 1: dg chest 1 view · 0.14mm/px · 2 of 2 slices shown]
[im 1/2]
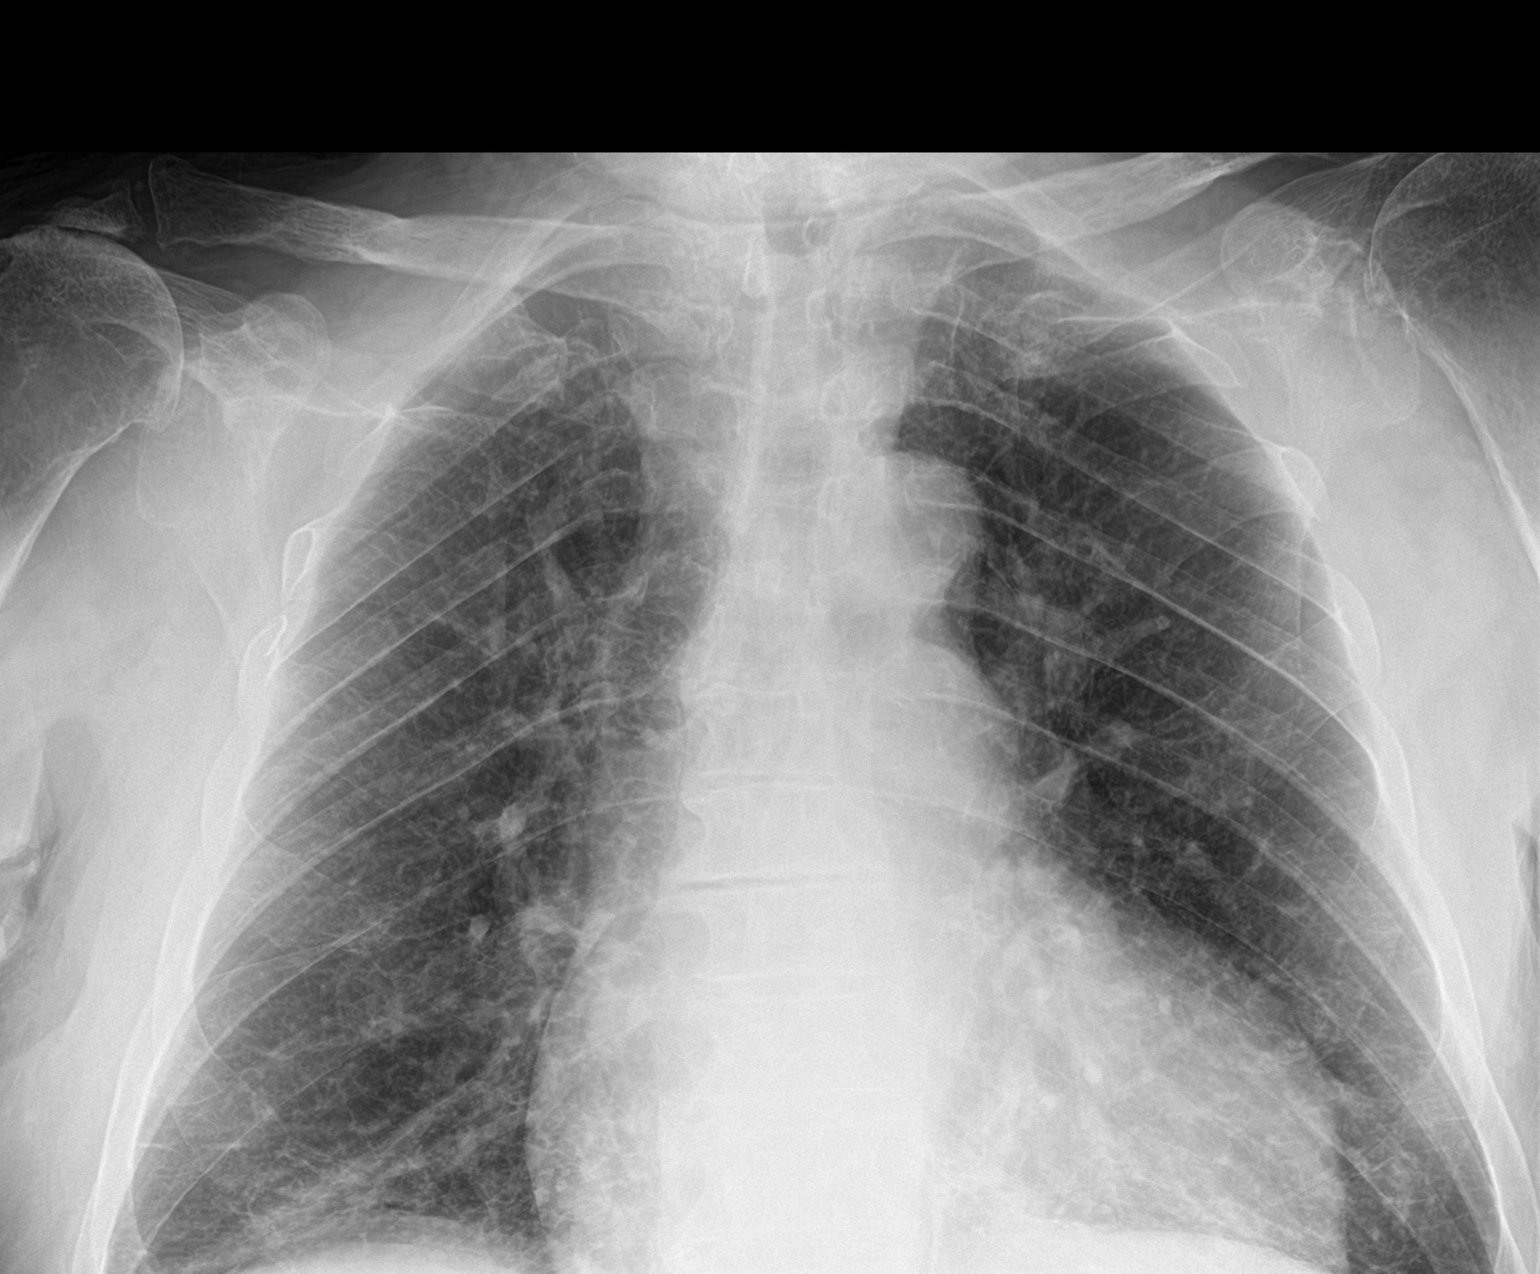
[im 2/2]
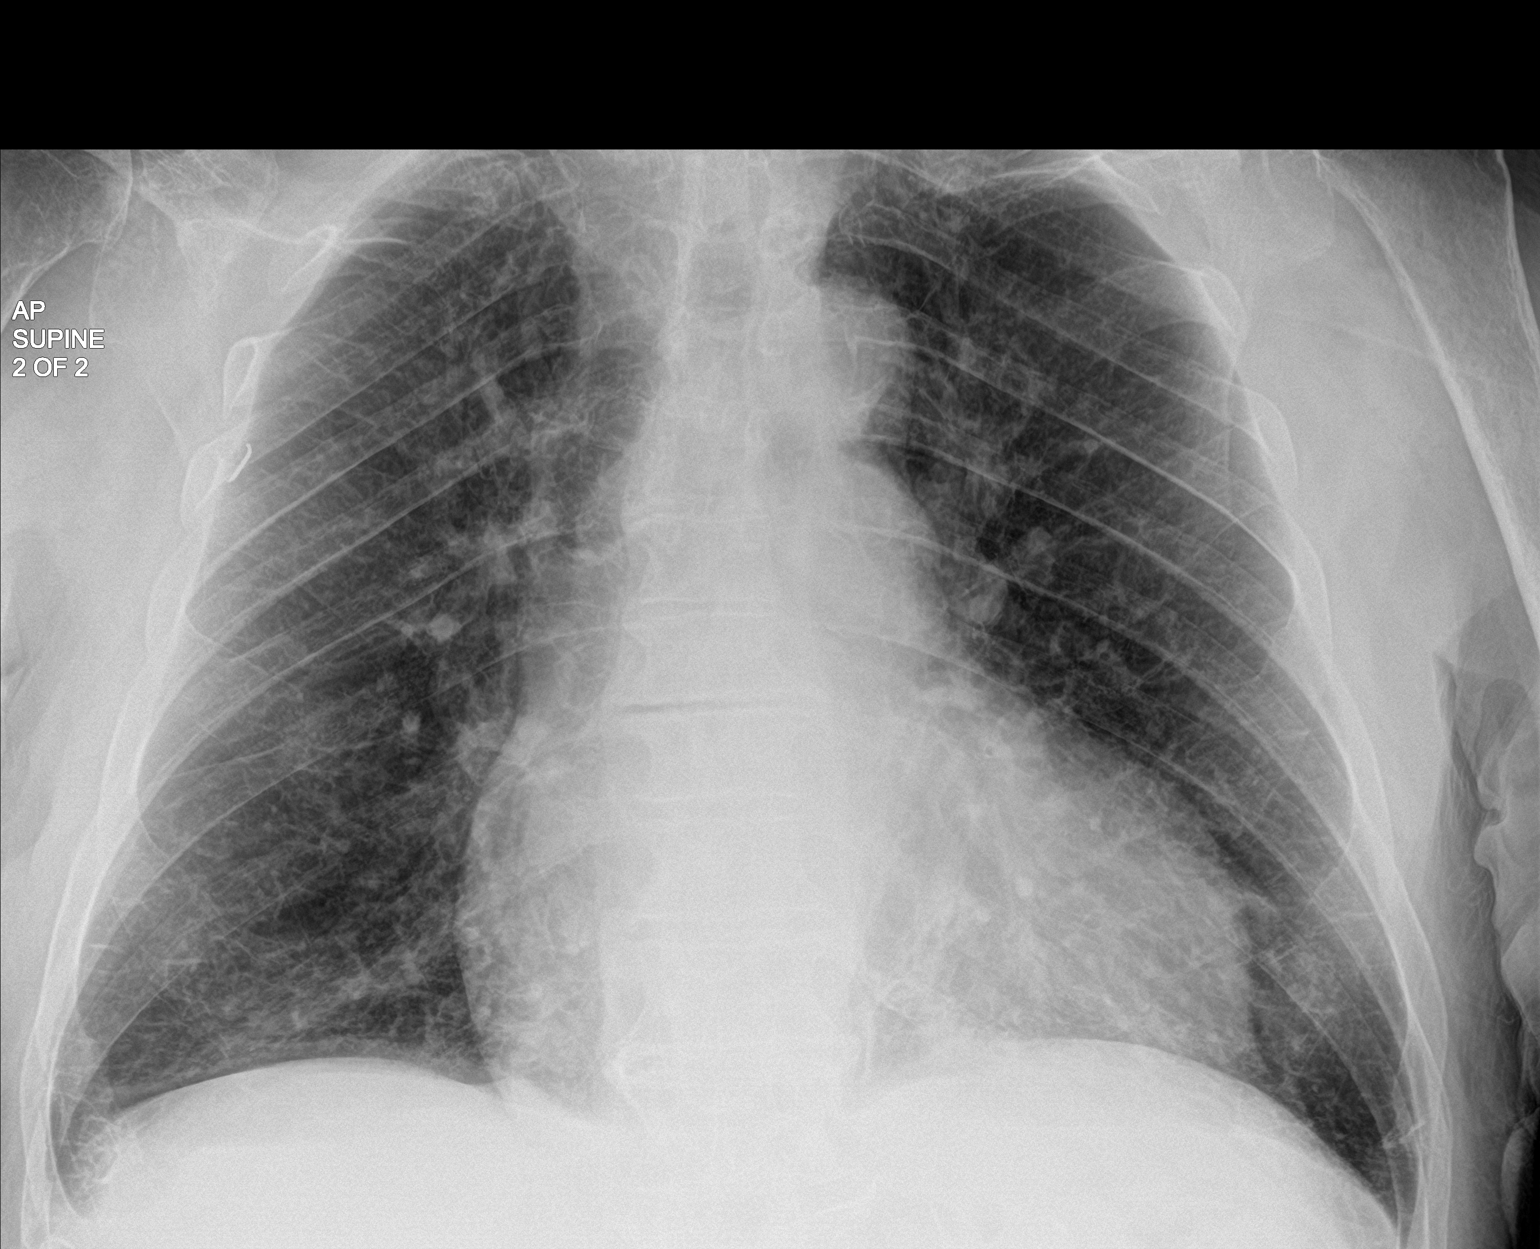

[2 of 2 positions shown; findings below may reference images not displayed]

FINDINGS: The heart is enlarged but stable. There is tortuosity and mild
ectasia of the thoracic aorta. Chronic bronchitic type interstitial
lung changes but no acute pulmonary findings. No pleural effusions.
The bony thorax appears intact. Stable radiopaque foreign body noted
in the right axilla.
IMPRESSION: No acute cardiopulmonary findings. Stable cardiac enlargement.

## 2022-02-17 IMAGING — US US ABDOMEN LIMITED
1 series · 14 of 25 positions shown · non-contrast
Comparison: CT 12/13/2019, ultrasound 12/13/2019

CLINICAL DATA: Transaminitis, painless jaundice

EXAM:
ULTRASOUND ABDOMEN LIMITED RIGHT UPPER QUADRANT

[Series 1: us abdomen limited · 14 of 38 slices shown]
[im 1/38]
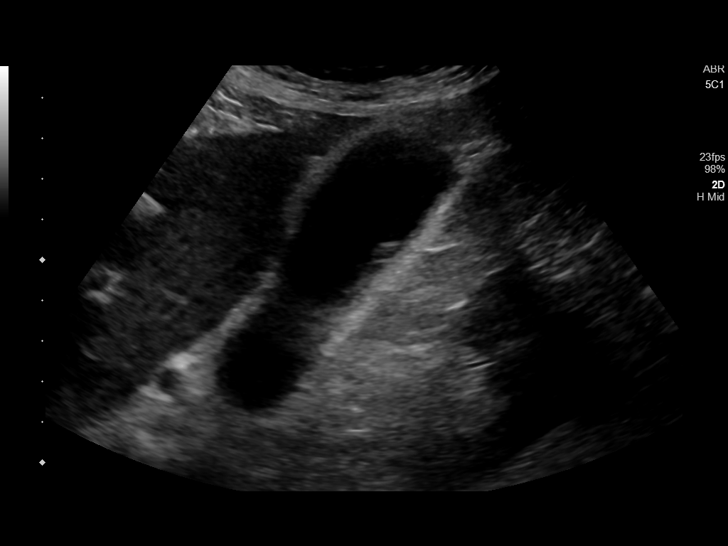
[im 4/38]
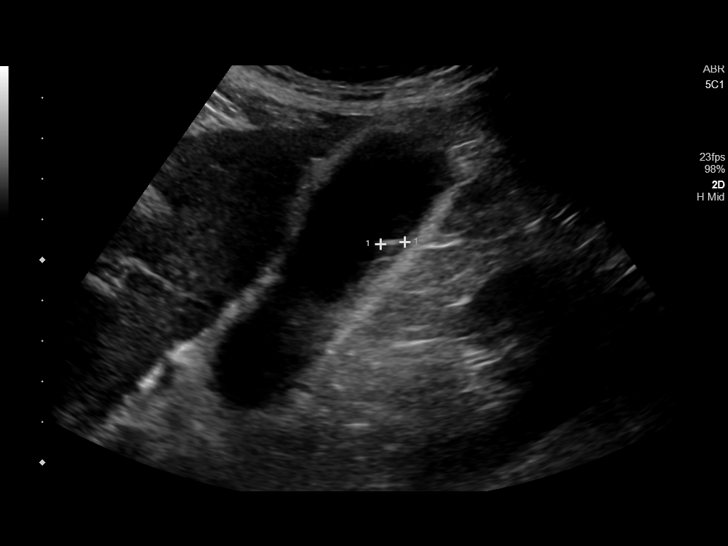
[im 7/38]
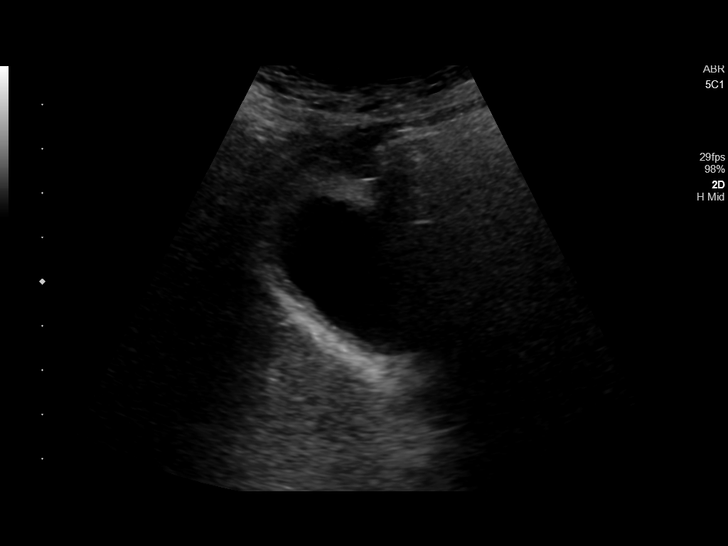
[im 10/38]
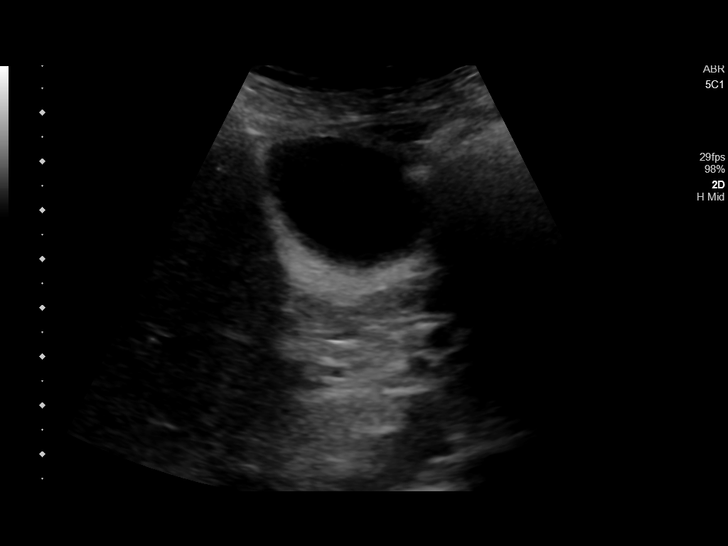
[im 13/38]
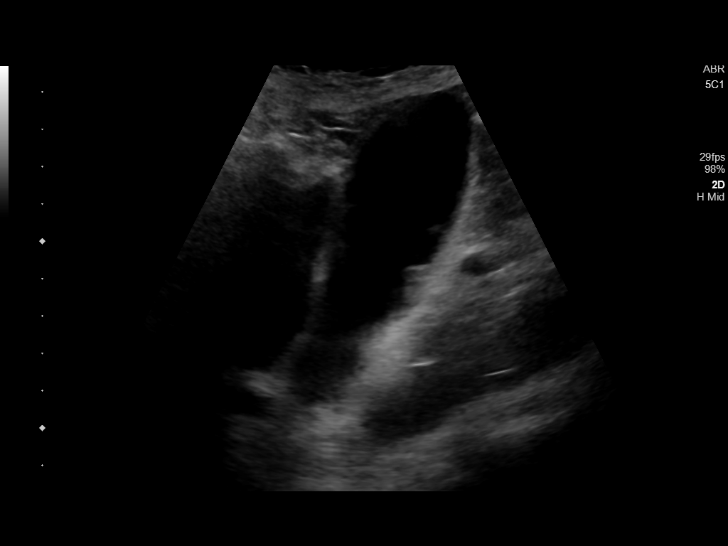
[im 14/38]
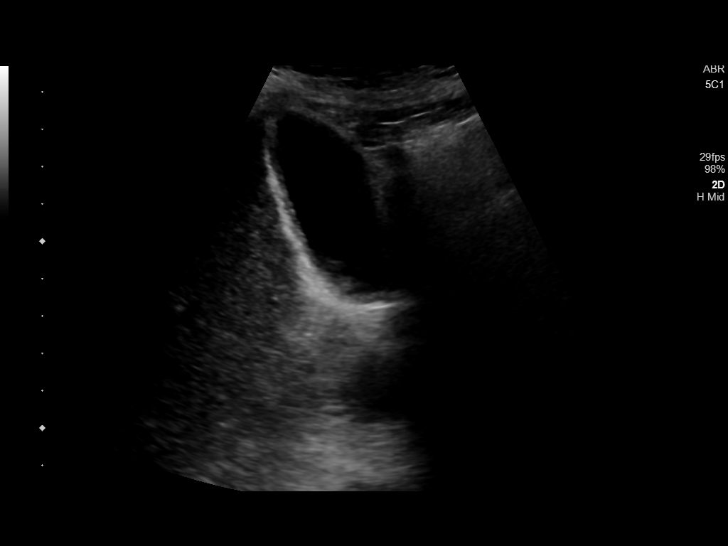
[im 17/38]
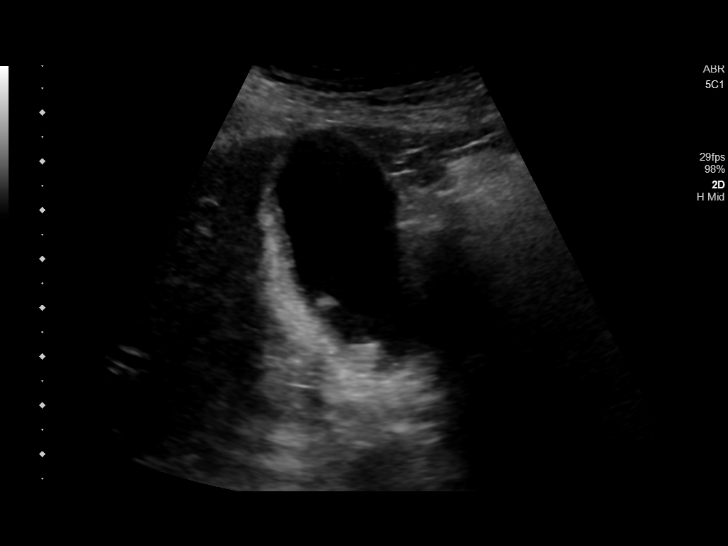
[im 21/38]
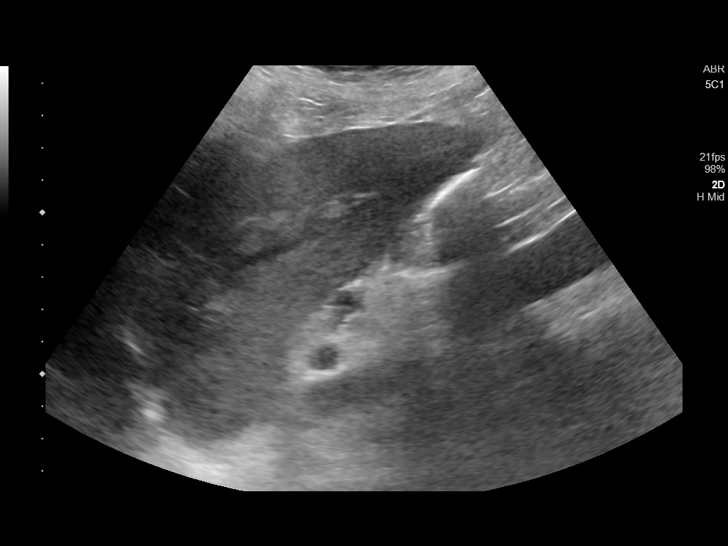
[im 24/38]
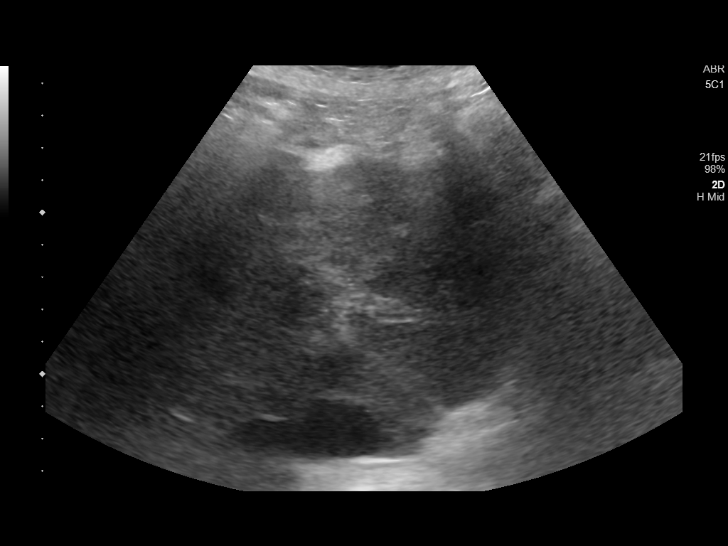
[im 25/38]
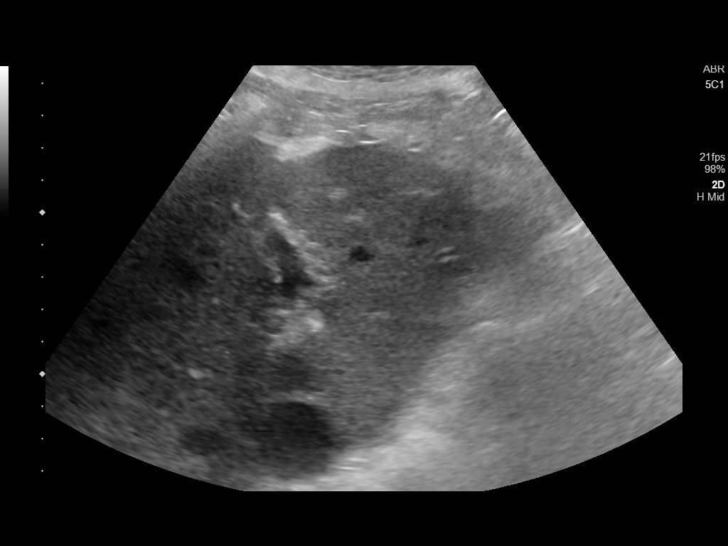
[im 28/38]
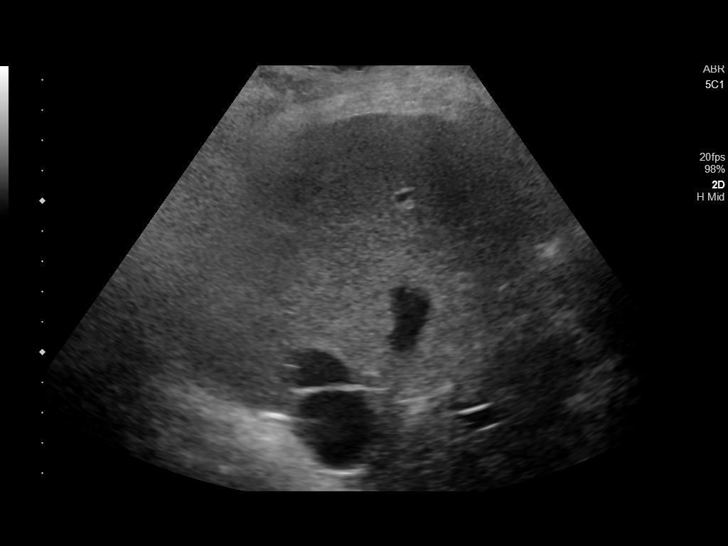
[im 31/38]
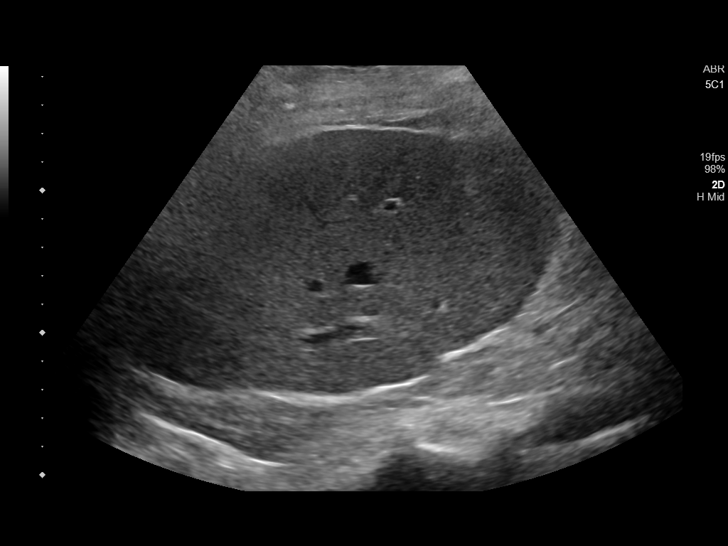
[im 34/38]
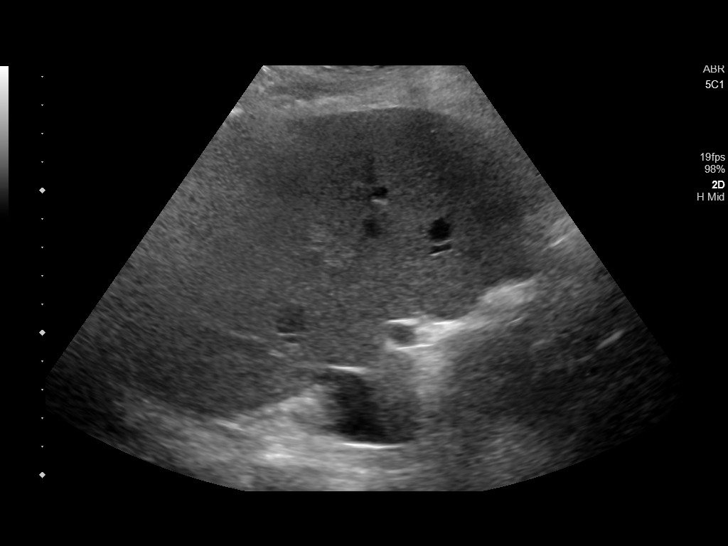
[im 38/38]
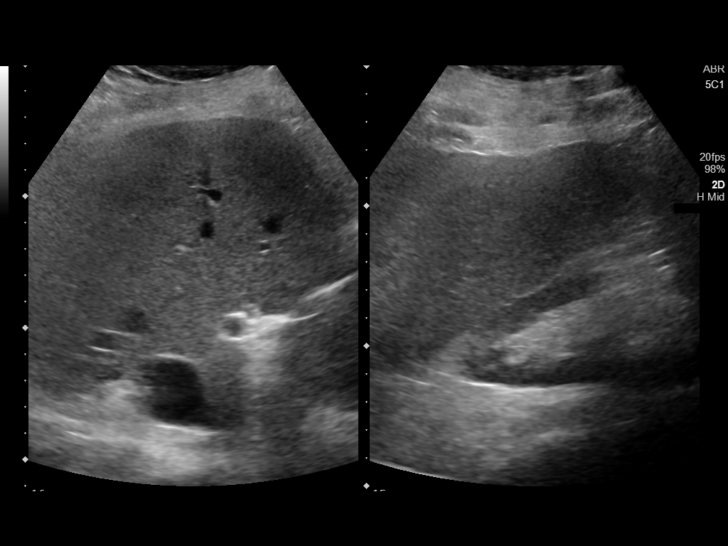

[14 of 25 positions shown; findings below may reference images not displayed]

FINDINGS: Gallbladder:

Gallbladder appears mildly distended with mobile echogenic
gallstones and biliary sludge. Largest stone measures 6 mm in
diameter. Wall thickness is normal at 2 mm. No pericholecystic fluid
or inflammation. Sonographic Murphy sign is reportedly negative.

Common bile duct:

Diameter: 2 mm, nondilated

Liver:

No focal lesion identified. Within normal limits in parenchymal
echogenicity. Portal vein is patent on color Doppler imaging with
normal direction of blood flow towards the liver.

Other: None.
IMPRESSION: Cholelithiasis and biliary sludge without sonographic evidence of
acute cholecystitis or biliary ductal dilatation.

Otherwise unremarkable right upper quadrant ultrasound.

## 2022-05-01 ENCOUNTER — Other Ambulatory Visit: Payer: Self-pay

## 2022-05-01 ENCOUNTER — Emergency Department
Admission: EM | Admit: 2022-05-01 | Discharge: 2022-05-01 | Disposition: A | Discharge status: Home / Self Care | Payer: Medicare PPO | Attending: Emergency Medicine | Admitting: Emergency Medicine

## 2022-05-01 ENCOUNTER — Emergency Department: Payer: Medicare PPO

## 2022-05-01 DIAGNOSIS — Y92007 Garden or yard of unspecified non-institutional (private) residence as the place of occurrence of the external cause: Secondary | ICD-10-CM | POA: Insufficient documentation

## 2022-05-01 DIAGNOSIS — S81811A Laceration without foreign body, right lower leg, initial encounter: Secondary | ICD-10-CM | POA: Insufficient documentation

## 2022-05-01 DIAGNOSIS — Z23 Encounter for immunization: Secondary | ICD-10-CM | POA: Insufficient documentation

## 2022-05-01 DIAGNOSIS — I509 Heart failure, unspecified: Secondary | ICD-10-CM | POA: Diagnosis not present

## 2022-05-01 DIAGNOSIS — I110 Hypertensive heart disease with heart failure: Secondary | ICD-10-CM | POA: Diagnosis not present

## 2022-05-01 DIAGNOSIS — S8991XA Unspecified injury of right lower leg, initial encounter: Secondary | ICD-10-CM | POA: Diagnosis present

## 2022-05-01 DIAGNOSIS — I480 Paroxysmal atrial fibrillation: Secondary | ICD-10-CM | POA: Diagnosis not present

## 2022-05-01 DIAGNOSIS — W228XXA Striking against or struck by other objects, initial encounter: Secondary | ICD-10-CM | POA: Diagnosis not present

## 2022-05-01 MED ORDER — TETANUS-DIPHTH-ACELL PERTUSSIS 5-2.5-18.5 LF-MCG/0.5 IM SUSY
0.5000 mL | PREFILLED_SYRINGE | Freq: Once | INTRAMUSCULAR | Status: AC
  Administered 2022-05-01: 0.5 mL via INTRAMUSCULAR
  Filled 2022-05-01: qty 0.5

## 2022-05-01 NOTE — ED Provider Notes (Signed)
[facility] Provider Note    None    (approximate)   History   Leg Injury   HPI  Danny Brady is a 88 y.o. male   presents to the ED with complaint of laceration to his right lower extremity yesterday afternoon when he was trying to get off his lawn more.  Patient states that he hit the pedal on his lawn more which caused the skin to break.  He was brought in today because it has continued to bleed somewhat.  Patient and family are unsure of his last tetanus immunization.  Patient has a history of  hypertension, CHF and paroxysmal atrial fibrillation, anxiety and edema.      Physical Exam   Triage Vital Signs: ED Triage Vitals  Enc Vitals Group     BP 05/01/22 0820 (!) 175/70     Pulse Rate 05/01/22 0820 70     Resp 05/01/22 0820 18     Temp 05/01/22 0820 98 F (36.7 C)     Temp src --      SpO2 05/01/22 0820 99 %     Weight --      Height --      Head Circumference --      Peak Flow --      Pain Score 05/01/22 0819 0     Pain Loc --      Pain Edu? --      Excl. in GC? --     Most recent vital signs: Vitals:   05/01/22 0820  BP: (!) 175/70  Pulse: 70  Resp: 18  Temp: 98 F (36.7 C)  SpO2: 99%     General: Awake, no distress.  CV:  Good peripheral perfusion.  Resp:  Normal effort.  Abd:  No distention.  Other:  Right lower extremity with a skin tear measuring approximately 4-1/2 cm.  No foreign body seen.  Minimal bleeding at this time.   ED Results / Procedures / Treatments   Labs (all labs ordered are listed, but only abnormal results are displayed) Labs Reviewed - No data to display   RADIOLOGY Right tib-fib x-rays were reviewed and interpreted by myself independent of the radiologist and was negative for acute bony injury.  No foreign body was seen.    PROCEDURES:  Critical Care performed:   Procedures Area was cleaned with normal saline.  3 areas were Steri-Stripped to approximate the edges.  Area was  dressed with a colloid dressing.  MEDICATIONS ORDERED IN ED: Medications  Tdap (BOOSTRIX) injection 0.5 mL (0.5 mLs Intramuscular Given 05/01/22 1010)     IMPRESSION / MDM / ASSESSMENT AND PLAN / ED COURSE  I reviewed the triage vital signs and the nursing notes.   Differential diagnosis includes, but is not limited to, laceration right lower extremity, contusion, possible foreign body, fracture tibia.  88 year old male presents to the ED with a laceration to his right lower extremity that happened yesterday afternoon.  Has continued to ambulate with some assistance which is normal for him.  Family was unsure of his last tetanus but possibly over 10 years.  This was updated.  Area was approximated and colloid dressing applied.  Family was given instructions on the dressing and to follow-up with his PCP if any continued problems or concerns.  He was also encouraged to elevate his lower extremity as often as possible to reduce any swelling.      Patient's presentation is most consistent with acute complicated  illness / injury requiring diagnostic workup.  FINAL CLINICAL IMPRESSION(S) / ED DIAGNOSES   Final diagnoses:  Noninfected skin tear of right lower extremity, initial encounter     Rx / DC Orders   ED Discharge Orders     None        Note:  This document was prepared using Dragon voice recognition software and may include unintentional dictation errors.   Tommi Rumps, PA-C 05/01/22 [id number]    Merwyn Katos, MD 05/02/22 [phone]

## 2022-05-01 NOTE — ED Notes (Signed)
Dressing applied to right lower leg  Tolerated well

## 2022-05-01 NOTE — ED Notes (Signed)
See triage note  presents with injury to right lower leg  States he hit it on his lawn mower pedal yesterday afternoon   Family states they wrapped his leg with pressure dressing  but cont's to bleed

## 2022-05-01 NOTE — ED Triage Notes (Addendum)
Pt comes with c/o right leg injury. Pt states yesterday he hit his shin on the lawnmower. Pt is not diabetic and no thinners. Bandage in place.

## 2022-05-01 NOTE — Discharge Instructions (Signed)
Leave dressing on for 7 days.  Elevate leg frequently to reduce swelling which will help with healing right.  Let Steri-Strips fall off on their own.

## 2023-04-21 ENCOUNTER — Encounter: Payer: Self-pay | Admitting: *Deleted

## 2023-04-21 ENCOUNTER — Emergency Department: Payer: Medicare PPO

## 2023-04-21 ENCOUNTER — Other Ambulatory Visit: Payer: Self-pay

## 2023-04-21 ENCOUNTER — Emergency Department
Admission: EM | Admit: 2023-04-21 | Discharge: 2023-04-22 | Disposition: A | Discharge status: Other Acute Inpatient Hospital | Payer: Medicare PPO | Attending: Emergency Medicine | Admitting: Emergency Medicine

## 2023-04-21 DIAGNOSIS — Z96651 Presence of right artificial knee joint: Secondary | ICD-10-CM | POA: Insufficient documentation

## 2023-04-21 DIAGNOSIS — S0990XA Unspecified injury of head, initial encounter: Secondary | ICD-10-CM | POA: Diagnosis not present

## 2023-04-21 DIAGNOSIS — W1839XA Other fall on same level, initial encounter: Secondary | ICD-10-CM | POA: Insufficient documentation

## 2023-04-21 DIAGNOSIS — M25561 Pain in right knee: Secondary | ICD-10-CM | POA: Diagnosis present

## 2023-04-21 DIAGNOSIS — S72331A Displaced oblique fracture of shaft of right femur, initial encounter for closed fracture: Secondary | ICD-10-CM | POA: Diagnosis not present

## 2023-04-21 LAB — CBC WITH DIFFERENTIAL/PLATELET
Abs Immature Granulocytes: 0.06 10*3/uL (ref 0.00–0.07)
Basophils Absolute: 0 10*3/uL (ref 0.0–0.1)
Basophils Relative: 0 %
Eosinophils Absolute: 0 10*3/uL (ref 0.0–0.5)
Eosinophils Relative: 0 %
HCT: 28.5 % — ABNORMAL LOW (ref 39.0–52.0)
Hemoglobin: 9.4 g/dL — ABNORMAL LOW (ref 13.0–17.0)
Immature Granulocytes: 1 %
Lymphocytes Relative: 5 %
Lymphs Abs: 0.5 10*3/uL — ABNORMAL LOW (ref 0.7–4.0)
MCH: 32.5 pg (ref 26.0–34.0)
MCHC: 33 g/dL (ref 30.0–36.0)
MCV: 98.6 fL (ref 80.0–100.0)
Monocytes Absolute: 0.9 10*3/uL (ref 0.1–1.0)
Monocytes Relative: 10 %
Neutro Abs: 7.9 10*3/uL — ABNORMAL HIGH (ref 1.7–7.7)
Neutrophils Relative %: 84 %
Platelets: 136 10*3/uL — ABNORMAL LOW (ref 150–400)
RBC: 2.89 MIL/uL — ABNORMAL LOW (ref 4.22–5.81)
RDW: 14.7 % (ref 11.5–15.5)
WBC: 9.4 10*3/uL (ref 4.0–10.5)
nRBC: 0 % (ref 0.0–0.2)

## 2023-04-21 LAB — COMPREHENSIVE METABOLIC PANEL
ALT: 12 U/L (ref 0–44)
AST: 19 U/L (ref 15–41)
Albumin: 3.2 g/dL — ABNORMAL LOW (ref 3.5–5.0)
Alkaline Phosphatase: 54 U/L (ref 38–126)
Anion gap: 9 (ref 5–15)
BUN: 21 mg/dL (ref 8–23)
CO2: 26 mmol/L (ref 22–32)
Calcium: 8.8 mg/dL — ABNORMAL LOW (ref 8.9–10.3)
Chloride: 100 mmol/L (ref 98–111)
Creatinine, Ser: 1.04 mg/dL (ref 0.61–1.24)
GFR, Estimated: >60 mL/min (ref >60)
Glucose, Bld: 166 mg/dL — ABNORMAL HIGH (ref 70–99)
Potassium: 4.4 mmol/L (ref 3.5–5.1)
Sodium: 135 mmol/L (ref 135–145)
Total Bilirubin: 1.1 mg/dL (ref 0.3–1.2)
Total Protein: 5.9 g/dL — ABNORMAL LOW (ref 6.5–8.1)

## 2023-04-21 MED ORDER — OXYCODONE-ACETAMINOPHEN 5-325 MG PO TABS
1.0000 | ORAL_TABLET | Freq: Once | ORAL | Status: AC
  Administered 2023-04-21: 1 via ORAL
  Filled 2023-04-21: qty 1

## 2023-04-21 MED ORDER — MORPHINE SULFATE (PF) 4 MG/ML IV SOLN
4.0000 mg | Freq: Once | INTRAVENOUS | Status: AC
  Administered 2023-04-21: 4 mg via INTRAVENOUS
  Filled 2023-04-21: qty 1

## 2023-04-21 MED ORDER — ONDANSETRON HCL 4 MG/2ML IJ SOLN
4.0000 mg | Freq: Once | INTRAMUSCULAR | Status: AC
  Administered 2023-04-21: 4 mg via INTRAVENOUS
  Filled 2023-04-21: qty 2

## 2023-04-21 NOTE — ED Notes (Signed)
Pt's spo2 86% on room air while sleeping. Pt placed on 2L [place] - spo2 98%.

## 2023-04-21 NOTE — ED Notes (Signed)
Facesheet faxed, Minerva Areola to [employer] to both [place] and [place], per [place], [place]

## 2023-04-21 NOTE — ED Triage Notes (Signed)
Per EMT report, patient let go of his walker to take his wallet from his caretaker and "slid" to the floor. Patient denies hitting head or loss of consciousness. Patient c/o right upper leg pain. Patient was able to weight bear at the scene, but c/o increased pain.   70 pulse 95% RA 180/100 blood pressure

## 2023-04-21 NOTE — ED Notes (Signed)
Patient is back CT scan

## 2023-04-21 NOTE — ED Provider Notes (Signed)
[facility] Provider Note  Patient Contact: 4:24 PM (approximate)   History   Fall   HPI  Danny Brady is a 89 y.o. male who presents the emergency department after a fall.  Patient reportedly lost balance while he was trying to reach for an item after he let go of his walker.  Patient fell backwards into a door frame, slid into the floor.  Does not believe that he hit his head but is not 100% sure.  No reported loss of consciousness.  His main complaint is hip and knee pain on the right side.  Patient denies any open wounds.  No headache, vision changes, chest pain, shortness of breath, bowel or bladder dysfunction, saddle anesthesia or paresthesias.     Physical Exam   Triage Vital Signs: ED Triage Vitals  Enc Vitals Group     BP 04/21/23 1511 (!) 145/84     Pulse Rate 04/21/23 1511 76     Resp 04/21/23 1511 18     Temp 04/21/23 1511 98 F (36.7 C)     Temp Source 04/21/23 1511 Oral     SpO2 04/21/23 1511 96 %     Weight 04/21/23 1513 205 lb (93 kg)     Height 04/21/23 1513 5\' 9"  (1.753 m)     Head Circumference --      Peak Flow --      Pain Score 04/21/23 1512 8     Pain Loc --      Pain Edu? --      Excl. in GC? --     Most recent vital signs: Vitals:   04/21/23 1922 04/21/23 2014  BP: (!) 112/59 112/76  Pulse: 69 80  Resp:  16  Temp: 98.4 F (36.9 C) 97.8 F (36.6 C)  SpO2: (!) 86% 94%     General: Alert and in no acute distress. Eyes:  PERRL. EOMI. Head: No acute traumatic findings  Neck: No stridor. No cervical spine tenderness to palpation.  Cardiovascular:  Good peripheral perfusion Respiratory: Normal respiratory effort without tachypnea or retractions. Lungs CTAB. Musculoskeletal: Full range of motion to all extremities.  No shortening or rotation of either lower extremity.  Patient is tender diffusely through the mid thigh into the right knee.  There is edema of the right knee when compared to left.  No open wounds.   Patient was able to ambulate on this leg after the fall but states that doing some increased pain.  Pulses sensation intact distally. Neurologic:  No gross focal neurologic deficits are appreciated.  Skin:   No rash noted Other:   ED Results / Procedures / Treatments   Labs (all labs ordered are listed, but only abnormal results are displayed) Labs Reviewed  COMPREHENSIVE METABOLIC PANEL - Abnormal; Notable for the following components:      Result Value   Glucose, Bld 166 (*)    Calcium 8.8 (*)    Total Protein 5.9 (*)    Albumin 3.2 (*)    All other components within normal limits  CBC WITH DIFFERENTIAL/PLATELET - Abnormal; Notable for the following components:   RBC 2.89 (*)    Hemoglobin 9.4 (*)    HCT 28.5 (*)    Platelets 136 (*)    Neutro Abs 7.9 (*)    Lymphs Abs 0.5 (*)    All other components within normal limits     EKG     RADIOLOGY  I personally viewed, evaluated, and interpreted  these images as part of my medical decision making, as well as reviewing the written report by the radiologist.  ED Provider Interpretation: Evaluation of initial x-rays reveal what appears to be a periprosthetic fracture of the right of the right femur.  This is visualized on both the hip as well as knee x-ray.  This was followed with CT scan which confirms a fracture.  Discussed results with on-call orthopedic surgeon, Dr. Allena Katz  CT FEMUR RIGHT WO CONTRAST  Result Date: 04/21/2023 CLINICAL DATA:  Distal femoral fracture EXAM: CT OF THE LOWER RIGHT EXTREMITY WITHOUT CONTRAST TECHNIQUE: Multidetector CT imaging of the right lower extremity was performed according to the standard protocol. RADIATION DOSE REDUCTION: This exam was performed according to the departmental dose-optimization program which includes automated exposure control, adjustment of the mA and/or kV according to patient size and/or use of iterative reconstruction technique. COMPARISON:  None Available. FINDINGS:  Bones/Joint/Cartilage Changes consistent with prior operative fixation of a proximal right femoral fracture is noted. No new proximal femoral fracture is seen. There is however a comminuted fracture involving the distal femoral shaft extending into the metaphysis with mild displacement of the fracture fragments. The fracture lines anteriorly extend to just above the left knee prosthesis. Proximal tibia and fibula appear limits. Ligaments Suboptimally assessed by CT. Muscles and Tendons Calf musculature shows some fatty replacement consistent with atrophy. Musculature of the thigh shows some mild edema related to the recent fracture. No sizable hematoma is seen. Soft tissues Mild edema is noted surrounding the fracture site. No sizable hematoma is noted. Small joint effusion is seen. IMPRESSION: Oblique fracture through distal femoral diaphysis extending into the metaphysis just above the right knee prosthesis. Electronically Signed   By: Alcide Clever M.D.   On: 04/21/2023 19:54   DG Hips Bilat W or Wo Pelvis 3-4 Views  Result Date: 04/21/2023 CLINICAL DATA:  Larey Seat, right leg pain, deformity EXAM: DG HIP (WITH OR WITHOUT PELVIS) 3-4V BILAT; RIGHT KNEE - COMPLETE 4+ VIEW COMPARISON:  11/20/2019 FINDINGS: Bilateral hips: Frontal view of the pelvis as well as frontal and lateral views of the bilateral hips are obtained. There is partial visualization of an acute oblique right femoral diaphyseal fracture incompletely evaluated on this exam. Intramedullary rod and proximal dynamic screw are seen within the right femur, with evidence of a prior healed intertrochanteric right hip fracture. There are no acute hip fractures. Symmetrical bilateral hip osteoarthritis. The remainder of the bony pelvis is unremarkable. Right knee: Frontal, bilateral oblique, lateral views of the right knee are obtained. Distal aspect of an intramedullary rod is seen within the right femur, with an oblique displaced fracture of the distal right  femoral diaphysis. There is external rotation of the fracture site. Three component right knee arthroplasty is identified without evidence of complication. The fracture of the distal femoral diaphysis does not appear to involve the right knee prosthesis. Small joint effusion. Diffuse soft tissue swelling of the distal thigh and knee. IMPRESSION: 1. Acute displaced oblique fracture of the distal right femoral diaphysis. The fracture line involves the distal margin of the intramedullary rod from prior right hip ORIF. The fracture does not appear to involve the right knee prosthesis. 2. Small right knee effusion. 3. Diffuse soft tissue swelling of the distal right thigh and right knee. 4. Symmetrical bilateral hip osteoarthritis. Electronically Signed   By: Sharlet Salina M.D.   On: 04/21/2023 17:50   DG Knee Complete 4 Views Right  Result Date: 04/21/2023 CLINICAL DATA:  Larey Seat,  right leg pain, deformity EXAM: DG HIP (WITH OR WITHOUT PELVIS) 3-4V BILAT; RIGHT KNEE - COMPLETE 4+ VIEW COMPARISON:  11/20/2019 FINDINGS: Bilateral hips: Frontal view of the pelvis as well as frontal and lateral views of the bilateral hips are obtained. There is partial visualization of an acute oblique right femoral diaphyseal fracture incompletely evaluated on this exam. Intramedullary rod and proximal dynamic screw are seen within the right femur, with evidence of a prior healed intertrochanteric right hip fracture. There are no acute hip fractures. Symmetrical bilateral hip osteoarthritis. The remainder of the bony pelvis is unremarkable. Right knee: Frontal, bilateral oblique, lateral views of the right knee are obtained. Distal aspect of an intramedullary rod is seen within the right femur, with an oblique displaced fracture of the distal right femoral diaphysis. There is external rotation of the fracture site. Three component right knee arthroplasty is identified without evidence of complication. The fracture of the distal femoral  diaphysis does not appear to involve the right knee prosthesis. Small joint effusion. Diffuse soft tissue swelling of the distal thigh and knee. IMPRESSION: 1. Acute displaced oblique fracture of the distal right femoral diaphysis. The fracture line involves the distal margin of the intramedullary rod from prior right hip ORIF. The fracture does not appear to involve the right knee prosthesis. 2. Small right knee effusion. 3. Diffuse soft tissue swelling of the distal right thigh and right knee. 4. Symmetrical bilateral hip osteoarthritis. Electronically Signed   By: Sharlet Salina M.D.   On: 04/21/2023 17:50   DG Lumbar Spine 2-3 Views  Result Date: 04/21/2023 CLINICAL DATA:  fall, hip pain, knee pain EXAM: LUMBAR SPINE - 2-3 VIEW COMPARISON:  Abdomen pelvis 12/13/2019 FINDINGS: There is no evidence of lumbar spine fracture. Alignment is normal. Multilevel severe degenerative changes spine. Chronic compression fracture of the L1 and L3 vertebral body. Multilevel intervertebral disc space narrowing. Aortic calcification. IMPRESSION: 1. No acute displaced fracture or traumatic listhesis of the lumbar spine in a patient with chronic L1 and L3 vertebral fractures. Limited evaluation due to overlapping osseous structures and overlying soft tissues. 2.  Aortic Atherosclerosis (ICD10-I70.0). Electronically Signed   By: Tish Frederickson M.D.   On: 04/21/2023 17:50   CT Head Wo Contrast  Result Date: 04/21/2023 CLINICAL DATA:  Head trauma, minor (Age >= 65y); Neck trauma (Age >= 65y) EXAM: CT HEAD WITHOUT CONTRAST CT CERVICAL SPINE WITHOUT CONTRAST TECHNIQUE: Multidetector CT imaging of the head and cervical spine was performed following the standard protocol without intravenous contrast. Multiplanar CT image reconstructions of the cervical spine were also generated. RADIATION DOSE REDUCTION: This exam was performed according to the departmental dose-optimization program which includes automated exposure control,  adjustment of the mA and/or kV according to patient size and/or use of iterative reconstruction technique. COMPARISON:  None Available. FINDINGS: CT HEAD FINDINGS Brain: Cerebral ventricle sizes are concordant with the degree of cerebral volume loss. Cerebral ventricle sizes are concordant with the degree of cerebral volume loss. No evidence of large-territorial acute infarction. No parenchymal hemorrhage. No mass lesion. No extra-axial collection. No mass effect or midline shift. No hydrocephalus. Basilar cisterns are patent. Vascular: No hyperdense vessel. Skull: No acute fracture or focal lesion. Sinuses/Orbits: Paranasal sinuses and mastoid air cells are clear. Bilateral lens replacement. Otherwise the orbits are unremarkable. Other: None. CT CERVICAL SPINE FINDINGS Alignment: Grade 1 anterolisthesis C5 on C6, C6 on C7, C7 on T1. Skull base and vertebrae: Multilevel moderate degenerative changes of the spine. Associated severe osseous foraminal on  the left C3-C4 level. No acute fracture. No aggressive appearing focal osseous lesion or focal pathologic process. Densely sclerotic lesion of the T2 level likely a bone island. Soft tissues and spinal canal: No prevertebral fluid or swelling. No visible canal hematoma. Upper chest: Unremarkable. Other: None. IMPRESSION: 1. No acute intracranial abnormality. 2. No acute displaced fracture or traumatic listhesis of the cervical spine. Electronically Signed   By: Tish Frederickson M.D.   On: 04/21/2023 17:14   CT Cervical Spine Wo Contrast  Result Date: 04/21/2023 CLINICAL DATA:  Head trauma, minor (Age >= 65y); Neck trauma (Age >= 65y) EXAM: CT HEAD WITHOUT CONTRAST CT CERVICAL SPINE WITHOUT CONTRAST TECHNIQUE: Multidetector CT imaging of the head and cervical spine was performed following the standard protocol without intravenous contrast. Multiplanar CT image reconstructions of the cervical spine were also generated. RADIATION DOSE REDUCTION: This exam was performed  according to the departmental dose-optimization program which includes automated exposure control, adjustment of the mA and/or kV according to patient size and/or use of iterative reconstruction technique. COMPARISON:  None Available. FINDINGS: CT HEAD FINDINGS Brain: Cerebral ventricle sizes are concordant with the degree of cerebral volume loss. Cerebral ventricle sizes are concordant with the degree of cerebral volume loss. No evidence of large-territorial acute infarction. No parenchymal hemorrhage. No mass lesion. No extra-axial collection. No mass effect or midline shift. No hydrocephalus. Basilar cisterns are patent. Vascular: No hyperdense vessel. Skull: No acute fracture or focal lesion. Sinuses/Orbits: Paranasal sinuses and mastoid air cells are clear. Bilateral lens replacement. Otherwise the orbits are unremarkable. Other: None. CT CERVICAL SPINE FINDINGS Alignment: Grade 1 anterolisthesis C5 on C6, C6 on C7, C7 on T1. Skull base and vertebrae: Multilevel moderate degenerative changes of the spine. Associated severe osseous foraminal on the left C3-C4 level. No acute fracture. No aggressive appearing focal osseous lesion or focal pathologic process. Densely sclerotic lesion of the T2 level likely a bone island. Soft tissues and spinal canal: No prevertebral fluid or swelling. No visible canal hematoma. Upper chest: Unremarkable. Other: None. IMPRESSION: 1. No acute intracranial abnormality. 2. No acute displaced fracture or traumatic listhesis of the cervical spine. Electronically Signed   By: Tish Frederickson M.D.   On: 04/21/2023 17:14    PROCEDURES:  Critical Care performed: No  Procedures   MEDICATIONS ORDERED IN ED: Medications  oxyCODONE-acetaminophen (PERCOCET/ROXICET) 5-325 MG per tablet 1 tablet (1 tablet Oral Given 04/21/23 1744)  morphine (PF) 4 MG/ML injection 4 mg (4 mg Intravenous Given 04/21/23 2216)  ondansetron (ZOFRAN) injection 4 mg (4 mg Intravenous Given 04/21/23 2214)      IMPRESSION / MDM / ASSESSMENT AND PLAN / ED COURSE  I reviewed the triage vital signs and the nursing notes.                                 Differential diagnosis includes, but is not limited to, hip fracture, knee fracture, periprosthetic fracture, contusion, intracranial hemorrhage, skull fracture   Patient's presentation is most consistent with acute presentation with potential threat to life or bodily function.   Patient's diagnosis is consistent with presents emergency department after mechanical fall.  Patient fell against a door jam and then ultimately slid into the floor.  Patient had imaging of his head, neck, back, bilateral hips and right knee.  Patient has a fracture of the right femur about his in-place hardware.  Patient has a rod from previous intertrochanteric fracture.  Patient  also has a knee replacement.  There is no involvement of the prosthesis of the knee though there is involvement around the rod of the femur.  I confirmed x-ray findings with CT as the x-ray of the hip and knee only showed part of the fracture itself.  I reviewed the results with on-call orthopedic surgeon, Dr. Allena Katz.  Given the nature of the fracture he recommends that this does need tertiary care for periprosthetic fracture.  I reached out to [facility] and they are at capacity and cannot take the patient.  Trying [facility] at this time..  At this time, waiting to hear back from [facility].  At shift change I will handover to oncoming provider to help arrange transfer to a tertiary center for surgical fixation.     FINAL CLINICAL IMPRESSION(S) / ED DIAGNOSES   Final diagnoses:  Closed displaced oblique fracture of shaft of right femur, initial encounter (HCC)     Rx / DC Orders   ED Discharge Orders     None        Note:  This document was prepared using Dragon voice recognition software and may include unintentional dictation errors.   Lanette Hampshire 04/21/23 2340    Delton Prairie,  MD 04/22/23 [phone]

## 2023-04-21 NOTE — ED Notes (Signed)
Family at bedside. 

## 2023-04-22 ENCOUNTER — Encounter (HOSPITAL_COMMUNITY)
Admission: RE | Disposition: A | Discharge status: Skilled Nursing Facility | Payer: Self-pay | Source: Ambulatory Visit | Attending: Internal Medicine

## 2023-04-22 ENCOUNTER — Other Ambulatory Visit: Payer: Self-pay

## 2023-04-22 ENCOUNTER — Encounter (HOSPITAL_COMMUNITY): Payer: Self-pay

## 2023-04-22 ENCOUNTER — Inpatient Hospital Stay (HOSPITAL_COMMUNITY): Payer: Medicare PPO | Admitting: Certified Registered Nurse Anesthetist

## 2023-04-22 ENCOUNTER — Inpatient Hospital Stay (HOSPITAL_COMMUNITY): Payer: Medicare PPO

## 2023-04-22 ENCOUNTER — Encounter (HOSPITAL_COMMUNITY): Payer: Self-pay | Admitting: Internal Medicine

## 2023-04-22 ENCOUNTER — Inpatient Hospital Stay (HOSPITAL_COMMUNITY)
Admission: RE | Admit: 2023-04-22 | Discharge: 2023-05-10 | DRG: 480 | Disposition: A | Discharge status: Skilled Nursing Facility | Payer: Medicare PPO | Source: Ambulatory Visit | Attending: Student | Admitting: Student

## 2023-04-22 DIAGNOSIS — D649 Anemia, unspecified: Secondary | ICD-10-CM | POA: Diagnosis not present

## 2023-04-22 DIAGNOSIS — M259 Joint disorder, unspecified: Secondary | ICD-10-CM | POA: Diagnosis not present

## 2023-04-22 DIAGNOSIS — I480 Paroxysmal atrial fibrillation: Secondary | ICD-10-CM | POA: Diagnosis present

## 2023-04-22 DIAGNOSIS — K5900 Constipation, unspecified: Secondary | ICD-10-CM | POA: Diagnosis not present

## 2023-04-22 DIAGNOSIS — F419 Anxiety disorder, unspecified: Secondary | ICD-10-CM | POA: Diagnosis present

## 2023-04-22 DIAGNOSIS — G8929 Other chronic pain: Secondary | ICD-10-CM | POA: Diagnosis present

## 2023-04-22 DIAGNOSIS — Z888 Allergy status to other drugs, medicaments and biological substances status: Secondary | ICD-10-CM

## 2023-04-22 DIAGNOSIS — D62 Acute posthemorrhagic anemia: Secondary | ICD-10-CM | POA: Diagnosis not present

## 2023-04-22 DIAGNOSIS — J1282 Pneumonia due to coronavirus disease 2019: Secondary | ICD-10-CM | POA: Diagnosis not present

## 2023-04-22 DIAGNOSIS — N179 Acute kidney failure, unspecified: Secondary | ICD-10-CM | POA: Diagnosis not present

## 2023-04-22 DIAGNOSIS — R739 Hyperglycemia, unspecified: Secondary | ICD-10-CM | POA: Diagnosis present

## 2023-04-22 DIAGNOSIS — F05 Delirium due to known physiological condition: Secondary | ICD-10-CM | POA: Diagnosis present

## 2023-04-22 DIAGNOSIS — M9711XA Periprosthetic fracture around internal prosthetic right knee joint, initial encounter: Secondary | ICD-10-CM | POA: Diagnosis present

## 2023-04-22 DIAGNOSIS — S41112A Laceration without foreign body of left upper arm, initial encounter: Secondary | ICD-10-CM | POA: Diagnosis present

## 2023-04-22 DIAGNOSIS — S72401A Unspecified fracture of lower end of right femur, initial encounter for closed fracture: Principal | ICD-10-CM | POA: Diagnosis present

## 2023-04-22 DIAGNOSIS — Z7982 Long term (current) use of aspirin: Secondary | ICD-10-CM | POA: Diagnosis not present

## 2023-04-22 DIAGNOSIS — K219 Gastro-esophageal reflux disease without esophagitis: Secondary | ICD-10-CM | POA: Diagnosis present

## 2023-04-22 DIAGNOSIS — I110 Hypertensive heart disease with heart failure: Secondary | ICD-10-CM | POA: Diagnosis present

## 2023-04-22 DIAGNOSIS — U071 COVID-19: Secondary | ICD-10-CM | POA: Diagnosis not present

## 2023-04-22 DIAGNOSIS — Y92009 Unspecified place in unspecified non-institutional (private) residence as the place of occurrence of the external cause: Secondary | ICD-10-CM

## 2023-04-22 DIAGNOSIS — E785 Hyperlipidemia, unspecified: Secondary | ICD-10-CM | POA: Diagnosis present

## 2023-04-22 DIAGNOSIS — E222 Syndrome of inappropriate secretion of antidiuretic hormone: Secondary | ICD-10-CM | POA: Diagnosis present

## 2023-04-22 DIAGNOSIS — E871 Hypo-osmolality and hyponatremia: Secondary | ICD-10-CM | POA: Diagnosis not present

## 2023-04-22 DIAGNOSIS — S7291XA Unspecified fracture of right femur, initial encounter for closed fracture: Principal | ICD-10-CM | POA: Diagnosis present

## 2023-04-22 DIAGNOSIS — M25519 Pain in unspecified shoulder: Secondary | ICD-10-CM | POA: Diagnosis present

## 2023-04-22 DIAGNOSIS — S72331A Displaced oblique fracture of shaft of right femur, initial encounter for closed fracture: Secondary | ICD-10-CM | POA: Diagnosis not present

## 2023-04-22 DIAGNOSIS — F411 Generalized anxiety disorder: Secondary | ICD-10-CM | POA: Diagnosis not present

## 2023-04-22 DIAGNOSIS — M79604 Pain in right leg: Secondary | ICD-10-CM | POA: Diagnosis present

## 2023-04-22 DIAGNOSIS — W1830XA Fall on same level, unspecified, initial encounter: Secondary | ICD-10-CM | POA: Diagnosis present

## 2023-04-22 DIAGNOSIS — I5022 Chronic systolic (congestive) heart failure: Secondary | ICD-10-CM

## 2023-04-22 DIAGNOSIS — E782 Mixed hyperlipidemia: Secondary | ICD-10-CM | POA: Diagnosis not present

## 2023-04-22 DIAGNOSIS — Z79899 Other long term (current) drug therapy: Secondary | ICD-10-CM

## 2023-04-22 DIAGNOSIS — F32A Depression, unspecified: Secondary | ICD-10-CM | POA: Diagnosis present

## 2023-04-22 DIAGNOSIS — Z8673 Personal history of transient ischemic attack (TIA), and cerebral infarction without residual deficits: Secondary | ICD-10-CM

## 2023-04-22 DIAGNOSIS — E876 Hypokalemia: Secondary | ICD-10-CM | POA: Diagnosis not present

## 2023-04-22 DIAGNOSIS — I5020 Unspecified systolic (congestive) heart failure: Secondary | ICD-10-CM | POA: Diagnosis not present

## 2023-04-22 DIAGNOSIS — S72001A Fracture of unspecified part of neck of right femur, initial encounter for closed fracture: Secondary | ICD-10-CM | POA: Diagnosis not present

## 2023-04-22 DIAGNOSIS — S51812A Laceration without foreign body of left forearm, initial encounter: Secondary | ICD-10-CM | POA: Diagnosis present

## 2023-04-22 HISTORY — PX: ORIF PERIPROSTHETIC FRACTURE: SHX5034

## 2023-04-22 LAB — CBC
HCT: 30.5 % — ABNORMAL LOW (ref 39.0–52.0)
HCT: 22 % — ABNORMAL LOW (ref 39.0–52.0)
Hemoglobin: 10 g/dL — ABNORMAL LOW (ref 13.0–17.0)
Hemoglobin: 7 g/dL — ABNORMAL LOW (ref 13.0–17.0)
MCH: 32.8 pg (ref 26.0–34.0)
MCH: 32 pg (ref 26.0–34.0)
MCHC: 32.8 g/dL (ref 30.0–36.0)
MCHC: 31.8 g/dL (ref 30.0–36.0)
MCV: 100 fL (ref 80.0–100.0)
MCV: 100.5 fL — ABNORMAL HIGH (ref 80.0–100.0)
Platelets: 127 10*3/uL — ABNORMAL LOW (ref 150–400)
Platelets: 103 10*3/uL — ABNORMAL LOW (ref 150–400)
RBC: 3.05 MIL/uL — ABNORMAL LOW (ref 4.22–5.81)
RBC: 2.19 MIL/uL — ABNORMAL LOW (ref 4.22–5.81)
RDW: 15.1 % (ref 11.5–15.5)
RDW: 15.2 % (ref 11.5–15.5)
WBC: 9 10*3/uL (ref 4.0–10.5)
WBC: 8.5 10*3/uL (ref 4.0–10.5)
nRBC: 0 % (ref 0.0–0.2)
nRBC: 0 % (ref 0.0–0.2)

## 2023-04-22 LAB — BASIC METABOLIC PANEL
Anion gap: 13 (ref 5–15)
BUN: 21 mg/dL (ref 8–23)
CO2: 24 mmol/L (ref 22–32)
Calcium: 8.8 mg/dL — ABNORMAL LOW (ref 8.9–10.3)
Chloride: 97 mmol/L — ABNORMAL LOW (ref 98–111)
Creatinine, Ser: 1.22 mg/dL (ref 0.61–1.24)
GFR, Estimated: 55 mL/min — ABNORMAL LOW (ref >60)
Glucose, Bld: 133 mg/dL — ABNORMAL HIGH (ref 70–99)
Potassium: 4.3 mmol/L (ref 3.5–5.1)
Sodium: 134 mmol/L — ABNORMAL LOW (ref 135–145)

## 2023-04-22 LAB — GLUCOSE, CAPILLARY
Glucose-Capillary: 109 mg/dL — ABNORMAL HIGH (ref 70–99)
Glucose-Capillary: 110 mg/dL — ABNORMAL HIGH (ref 70–99)
Glucose-Capillary: 123 mg/dL — ABNORMAL HIGH (ref 70–99)

## 2023-04-22 LAB — MAGNESIUM: Magnesium: 2.2 mg/dL (ref 1.7–2.4)

## 2023-04-22 LAB — PHOSPHORUS: Phosphorus: 4.7 mg/dL — ABNORMAL HIGH (ref 2.5–4.6)

## 2023-04-22 LAB — MRSA NEXT GEN BY PCR, NASAL: MRSA by PCR Next Gen: DETECTED — AB

## 2023-04-22 SURGERY — OPEN REDUCTION INTERNAL FIXATION (ORIF) PERIPROSTHETIC FRACTURE
Anesthesia: General | Site: Leg Upper | Laterality: Right

## 2023-04-22 MED ORDER — PROPOFOL 10 MG/ML IV BOLUS
INTRAVENOUS | Status: AC
  Filled 2023-04-22: qty 20

## 2023-04-22 MED ORDER — ASPIRIN 81 MG PO TBEC: 81.0000 mg | DELAYED_RELEASE_TABLET | Freq: Two times a day (BID) | ORAL | 0 refills | Status: DC

## 2023-04-22 MED ORDER — FENTANYL CITRATE (PF) 100 MCG/2ML IJ SOLN: 25.0000 ug | INTRAMUSCULAR | Status: DC | PRN

## 2023-04-22 MED ORDER — SODIUM CHLORIDE 0.9 % IV BOLUS
250.0000 mL | Freq: Once | INTRAVENOUS | Status: AC
  Administered 2023-04-23: 250 mL via INTRAVENOUS

## 2023-04-22 MED ORDER — TRANEXAMIC ACID-NACL 1000-0.7 MG/100ML-% IV SOLN
INTRAVENOUS | Status: AC
  Filled 2023-04-22: qty 100

## 2023-04-22 MED ORDER — PROPOFOL 500 MG/50ML IV EMUL
INTRAVENOUS | Status: DC | PRN
  Administered 2023-04-22: 50 ug/kg/min via INTRAVENOUS

## 2023-04-22 MED ORDER — OXYCODONE HCL 5 MG PO TABS: 5.0000 mg | ORAL_TABLET | Freq: Once | ORAL | Status: DC | PRN

## 2023-04-22 MED ORDER — SUGAMMADEX SODIUM 200 MG/2ML IV SOLN
INTRAVENOUS | Status: DC | PRN
  Administered 2023-04-22: 188.2 mg via INTRAVENOUS

## 2023-04-22 MED ORDER — CEFAZOLIN SODIUM-DEXTROSE 2-4 GM/100ML-% IV SOLN
2.0000 g | Freq: Three times a day (TID) | INTRAVENOUS | Status: AC
  Administered 2023-04-23: 2 g via INTRAVENOUS
  Filled 2023-04-22 (×2): qty 100

## 2023-04-22 MED ORDER — MELATONIN 5 MG PO TABS
5.0000 mg | ORAL_TABLET | Freq: Every evening | ORAL | Status: DC | PRN
  Administered 2023-04-22 – 2023-05-05 (×10): 5 mg via ORAL
  Filled 2023-04-22 (×11): qty 1

## 2023-04-22 MED ORDER — PANTOPRAZOLE SODIUM 40 MG PO TBEC
40.0000 mg | DELAYED_RELEASE_TABLET | Freq: Every day | ORAL | Status: DC
  Administered 2023-04-23 – 2023-05-10 (×18): 40 mg via ORAL
  Filled 2023-04-22 (×18): qty 1

## 2023-04-22 MED ORDER — CHLORHEXIDINE GLUCONATE 0.12 % MT SOLN
OROMUCOSAL | Status: AC
  Filled 2023-04-22: qty 15

## 2023-04-22 MED ORDER — FENTANYL CITRATE (PF) 250 MCG/5ML IJ SOLN
INTRAMUSCULAR | Status: AC
  Filled 2023-04-22: qty 5

## 2023-04-22 MED ORDER — INSULIN ASPART 100 UNIT/ML IJ SOLN: 0.0000 [IU] | INTRAMUSCULAR | Status: DC

## 2023-04-22 MED ORDER — OXYCODONE HCL 5 MG PO TABS: 2.5000 mg | ORAL_TABLET | ORAL | 0 refills | Status: DC | PRN

## 2023-04-22 MED ORDER — FENTANYL CITRATE (PF) 250 MCG/5ML IJ SOLN
INTRAMUSCULAR | Status: DC | PRN
  Administered 2023-04-22: 100 ug via INTRAVENOUS
  Administered 2023-04-22: 25 ug via INTRAVENOUS

## 2023-04-22 MED ORDER — PHENYLEPHRINE HCL-NACL 20-0.9 MG/250ML-% IV SOLN
INTRAVENOUS | Status: DC | PRN
  Administered 2023-04-22: 80 ug via INTRAVENOUS
  Administered 2023-04-22: 160 ug via INTRAVENOUS
  Administered 2023-04-22: 80 ug via INTRAVENOUS
  Administered 2023-04-22: 100 ug/min via INTRAVENOUS
  Administered 2023-04-22 (×2): 80 ug via INTRAVENOUS

## 2023-04-22 MED ORDER — CEFAZOLIN SODIUM-DEXTROSE 2-4 GM/100ML-% IV SOLN
INTRAVENOUS | Status: AC
  Administered 2023-04-22: 2 g via INTRAVENOUS
  Filled 2023-04-22: qty 100

## 2023-04-22 MED ORDER — BUPIVACAINE HCL (PF) 0.25 % IJ SOLN
INTRAMUSCULAR | Status: AC
  Filled 2023-04-22: qty 30

## 2023-04-22 MED ORDER — ALBUMIN HUMAN 5 % IV SOLN
INTRAVENOUS | Status: AC
  Filled 2023-04-22: qty 250

## 2023-04-22 MED ORDER — PROCHLORPERAZINE EDISYLATE 10 MG/2ML IJ SOLN: 5.0000 mg | Freq: Four times a day (QID) | INTRAMUSCULAR | Status: DC | PRN

## 2023-04-22 MED ORDER — ONDANSETRON HCL 4 MG/2ML IJ SOLN
INTRAMUSCULAR | Status: AC
  Filled 2023-04-22: qty 2

## 2023-04-22 MED ORDER — ACETAMINOPHEN 325 MG PO TABS
650.0000 mg | ORAL_TABLET | Freq: Four times a day (QID) | ORAL | Status: DC | PRN
  Administered 2023-04-22 – 2023-05-09 (×22): 650 mg via ORAL
  Filled 2023-04-22 (×23): qty 2

## 2023-04-22 MED ORDER — ROCURONIUM BROMIDE 10 MG/ML (PF) SYRINGE
PREFILLED_SYRINGE | INTRAVENOUS | Status: AC
  Filled 2023-04-22: qty 10

## 2023-04-22 MED ORDER — ACETAMINOPHEN 500 MG PO TABS
ORAL_TABLET | ORAL | Status: AC
  Administered 2023-04-22: 1000 mg via ORAL
  Filled 2023-04-22: qty 2

## 2023-04-22 MED ORDER — 0.9 % SODIUM CHLORIDE (POUR BTL) OPTIME
TOPICAL | Status: DC | PRN
  Administered 2023-04-22: 1000 mL

## 2023-04-22 MED ORDER — LIDOCAINE 2% (20 MG/ML) 5 ML SYRINGE
INTRAMUSCULAR | Status: AC
  Filled 2023-04-22: qty 5

## 2023-04-22 MED ORDER — PROPOFOL 1000 MG/100ML IV EMUL
INTRAVENOUS | Status: AC
  Filled 2023-04-22: qty 100

## 2023-04-22 MED ORDER — CEFAZOLIN SODIUM-DEXTROSE 2-4 GM/100ML-% IV SOLN
2.0000 g | INTRAVENOUS | Status: AC
  Administered 2023-04-22: 2 g via INTRAVENOUS

## 2023-04-22 MED ORDER — SERTRALINE HCL 50 MG PO TABS
50.0000 mg | ORAL_TABLET | Freq: Every day | ORAL | Status: DC
  Administered 2023-04-23 – 2023-05-10 (×18): 50 mg via ORAL
  Filled 2023-04-22 (×18): qty 1

## 2023-04-22 MED ORDER — ALBUMIN HUMAN 5 % IV SOLN
12.5000 g | Freq: Once | INTRAVENOUS | Status: AC
  Administered 2023-04-22: 12.5 g via INTRAVENOUS

## 2023-04-22 MED ORDER — INSULIN ASPART 100 UNIT/ML IJ SOLN
0.0000 [IU] | Freq: Three times a day (TID) | INTRAMUSCULAR | Status: DC
  Administered 2023-04-23 – 2023-04-25 (×7): 1 [IU] via SUBCUTANEOUS
  Administered 2023-04-26: 2 [IU] via SUBCUTANEOUS
  Administered 2023-04-26: 1 [IU] via SUBCUTANEOUS
  Administered 2023-04-27: 2 [IU] via SUBCUTANEOUS
  Administered 2023-04-28 – 2023-05-09 (×9): 1 [IU] via SUBCUTANEOUS

## 2023-04-22 MED ORDER — POLYETHYLENE GLYCOL 3350 17 G PO PACK
17.0000 g | PACK | Freq: Every day | ORAL | Status: DC | PRN
  Administered 2023-04-25: 17 g via ORAL
  Filled 2023-04-22: qty 1

## 2023-04-22 MED ORDER — VANCOMYCIN HCL 500 MG IV SOLR
INTRAVENOUS | Status: DC | PRN
  Administered 2023-04-22: 500 mg via TOPICAL

## 2023-04-22 MED ORDER — ORAL CARE MOUTH RINSE: 15.0000 mL | Freq: Once | OROMUCOSAL | Status: DC

## 2023-04-22 MED ORDER — ACETAMINOPHEN 500 MG PO TABS: 1000.0000 mg | ORAL_TABLET | Freq: Once | ORAL | Status: AC

## 2023-04-22 MED ORDER — OXYCODONE HCL 5 MG PO TABS
5.0000 mg | ORAL_TABLET | Freq: Four times a day (QID) | ORAL | Status: DC | PRN
  Administered 2023-04-22 – 2023-05-01 (×8): 5 mg via ORAL
  Filled 2023-04-22 (×8): qty 1

## 2023-04-22 MED ORDER — LACTATED RINGERS IV SOLN: INTRAVENOUS | Status: DC

## 2023-04-22 MED ORDER — CHLORHEXIDINE GLUCONATE 4 % EX SOLN: 60.0000 mL | Freq: Once | CUTANEOUS | Status: DC

## 2023-04-22 MED ORDER — MORPHINE SULFATE (PF) 4 MG/ML IV SOLN
4.0000 mg | Freq: Once | INTRAVENOUS | Status: AC
  Administered 2023-04-22: 4 mg via INTRAVENOUS
  Filled 2023-04-22: qty 1

## 2023-04-22 MED ORDER — ONDANSETRON HCL 4 MG/2ML IJ SOLN: 4.0000 mg | Freq: Once | INTRAMUSCULAR | Status: DC | PRN

## 2023-04-22 MED ORDER — PROPOFOL 10 MG/ML IV BOLUS
INTRAVENOUS | Status: DC | PRN
  Administered 2023-04-22: 80 mg via INTRAVENOUS

## 2023-04-22 MED ORDER — ALBUMIN HUMAN 5 % IV SOLN: INTRAVENOUS | Status: DC | PRN

## 2023-04-22 MED ORDER — TRANEXAMIC ACID-NACL 1000-0.7 MG/100ML-% IV SOLN
1000.0000 mg | INTRAVENOUS | Status: AC
  Administered 2023-04-22: 1000 mg via INTRAVENOUS

## 2023-04-22 MED ORDER — ROCURONIUM BROMIDE 10 MG/ML (PF) SYRINGE
PREFILLED_SYRINGE | INTRAVENOUS | Status: DC | PRN
  Administered 2023-04-22 (×2): 10 mg via INTRAVENOUS
  Administered 2023-04-22: 50 mg via INTRAVENOUS

## 2023-04-22 MED ORDER — ENOXAPARIN SODIUM 40 MG/0.4ML IJ SOSY
40.0000 mg | PREFILLED_SYRINGE | INTRAMUSCULAR | Status: DC
  Administered 2023-04-23: 40 mg via SUBCUTANEOUS
  Filled 2023-04-22: qty 0.4

## 2023-04-22 MED ORDER — BUPIVACAINE HCL 0.25 % IJ SOLN
INTRAMUSCULAR | Status: DC | PRN
  Administered 2023-04-22: 26 mL

## 2023-04-22 MED ORDER — ONDANSETRON HCL 4 MG/2ML IJ SOLN
INTRAMUSCULAR | Status: DC | PRN
  Administered 2023-04-22: 4 mg via INTRAVENOUS

## 2023-04-22 MED ORDER — STERILE WATER FOR IRRIGATION IR SOLN
Status: DC | PRN
  Administered 2023-04-22: 1000 mL

## 2023-04-22 MED ORDER — LIDOCAINE 2% (20 MG/ML) 5 ML SYRINGE
INTRAMUSCULAR | Status: DC | PRN
  Administered 2023-04-22: 40 mg via INTRAVENOUS

## 2023-04-22 MED ORDER — CHLORHEXIDINE GLUCONATE 0.12 % MT SOLN: 15.0000 mL | Freq: Once | OROMUCOSAL | Status: DC

## 2023-04-22 MED ORDER — VANCOMYCIN HCL 500 MG IV SOLR
INTRAVENOUS | Status: AC
  Filled 2023-04-22: qty 10

## 2023-04-22 MED ORDER — ONDANSETRON HCL 4 MG/2ML IJ SOLN
4.0000 mg | Freq: Once | INTRAMUSCULAR | Status: AC
  Administered 2023-04-22: 4 mg via INTRAVENOUS
  Filled 2023-04-22: qty 2

## 2023-04-22 MED ORDER — POVIDONE-IODINE 10 % EX SWAB
2.0000 | Freq: Once | CUTANEOUS | Status: AC
  Administered 2023-04-22: 2 via TOPICAL

## 2023-04-22 MED ORDER — OXYCODONE HCL 5 MG PO TABS
2.5000 mg | ORAL_TABLET | ORAL | Status: DC | PRN
  Administered 2023-04-28: 5 mg via ORAL
  Filled 2023-04-22 (×2): qty 1

## 2023-04-22 MED ORDER — OXYCODONE HCL 5 MG/5ML PO SOLN: 5.0000 mg | Freq: Once | ORAL | Status: DC | PRN

## 2023-04-22 MED ORDER — PHENYLEPHRINE 80 MCG/ML (10ML) SYRINGE FOR IV PUSH (FOR BLOOD PRESSURE SUPPORT)
PREFILLED_SYRINGE | INTRAVENOUS | Status: DC | PRN
  Administered 2023-04-22: 160 ug via INTRAVENOUS

## 2023-04-22 SURGICAL SUPPLY — 74 items
BAG COUNTER SPONGE SURGICOUNT (BAG) ×2 IMPLANT
BAG SPNG CNTER NS LX DISP (BAG) ×2
BIT DRILL 4.3 (BIT) ×2
BIT DRILL 4.3X300MM (BIT) ×1 IMPLANT
BIT DRILL LONG 3.3 (BIT) ×2 IMPLANT
BIT DRILL QC 3.3X195 (BIT) ×1 IMPLANT
BLADE CLIPPER SURG (BLADE) IMPLANT
BNDG CMPR MED 10X6 ELC LF (GAUZE/BANDAGES/DRESSINGS) ×2
BNDG ELASTIC 4X5.8 VLCR STR LF (GAUZE/BANDAGES/DRESSINGS) ×1 IMPLANT
BNDG ELASTIC 6X10 VLCR STRL LF (GAUZE/BANDAGES/DRESSINGS) ×1 IMPLANT
BNDG ELASTIC 6X5.8 VLCR STR LF (GAUZE/BANDAGES/DRESSINGS) ×1 IMPLANT
BNDG GAUZE DERMACEA FLUFF 4 (GAUZE/BANDAGES/DRESSINGS) ×1 IMPLANT
BNDG GZE DERMACEA 4 6PLY (GAUZE/BANDAGES/DRESSINGS)
CAP LOCK NCB (Cap) ×9 IMPLANT
CLSR STERI-STRIP ANTIMIC 1/2X4 (GAUZE/BANDAGES/DRESSINGS) ×1 IMPLANT
DRAPE C-ARM 42X72 X-RAY (DRAPES) ×2 IMPLANT
DRAPE C-ARMOR (DRAPES) ×2 IMPLANT
DRAPE IMP U-DRAPE 54X76 (DRAPES) ×3 IMPLANT
DRAPE ORTHO SPLIT 77X108 STRL (DRAPES) ×4
DRAPE SURG ORHT 6 SPLT 77X108 (DRAPES) ×4 IMPLANT
DRAPE U-SHAPE 47X51 STRL (DRAPES) ×2 IMPLANT
DRSG ADAPTIC 3X8 NADH LF (GAUZE/BANDAGES/DRESSINGS) ×1 IMPLANT
DRSG MEPILEX POST OP 4X8 (GAUZE/BANDAGES/DRESSINGS) ×3 IMPLANT
ELECT REM PT RETURN 9FT ADLT (ELECTROSURGICAL) ×2
ELECTRODE REM PT RTRN 9FT ADLT (ELECTROSURGICAL) ×2 IMPLANT
GAUZE PAD ABD 8X10 STRL (GAUZE/BANDAGES/DRESSINGS) ×4 IMPLANT
GAUZE SPONGE 4X4 12PLY STRL (GAUZE/BANDAGES/DRESSINGS) ×1 IMPLANT
GLOVE BIO SURGEON STRL SZ7.5 (GLOVE) ×1 IMPLANT
GLOVE BIOGEL PI IND STRL 7.5 (GLOVE) ×1 IMPLANT
GLOVE BIOGEL PI IND STRL 8 (GLOVE) ×5 IMPLANT
GLOVE SURG SYN 7.5 E (GLOVE) IMPLANT
GLOVE SURG SYN 7.5 PF PI (GLOVE) ×1 IMPLANT
GOWN STRL REUS W/ TWL LRG LVL3 (GOWN DISPOSABLE) ×2 IMPLANT
GOWN STRL REUS W/ TWL XL LVL3 (GOWN DISPOSABLE) ×4 IMPLANT
GOWN STRL REUS W/TWL LRG LVL3 (GOWN DISPOSABLE) ×2
GOWN STRL REUS W/TWL XL LVL3 (GOWN DISPOSABLE) ×4
K-WIRE 2.0 (WIRE) ×2
K-WIRE FXSTD 280X2XNS SS (WIRE) ×2
KIT BASIN OR (CUSTOM PROCEDURE TRAY) ×2 IMPLANT
KIT TURNOVER KIT B (KITS) ×2 IMPLANT
KWIRE FXSTD 280X2XNS SS (WIRE) ×1 IMPLANT
MANIFOLD NEPTUNE II (INSTRUMENTS) ×2 IMPLANT
NDL HYPO 22X1.5 SAFETY MO (MISCELLANEOUS) IMPLANT
NEEDLE HYPO 22X1.5 SAFETY MO (MISCELLANEOUS) ×2 IMPLANT
NS IRRIG 1000ML POUR BTL (IV SOLUTION) ×2 IMPLANT
PACK TOTAL JOINT (CUSTOM PROCEDURE TRAY) ×2 IMPLANT
PACK UNIVERSAL I (CUSTOM PROCEDURE TRAY) ×1 IMPLANT
PAD ARMBOARD 7.5X6 YLW CONV (MISCELLANEOUS) ×4 IMPLANT
PAD CAST 4YDX4 CTTN HI CHSV (CAST SUPPLIES) ×1 IMPLANT
PADDING CAST COTTON 4X4 STRL (CAST SUPPLIES)
PADDING CAST COTTON 6X4 STRL (CAST SUPPLIES) ×3 IMPLANT
PLATE DISTAL FEMUR 15H 317M RT (Plate) ×1 IMPLANT
SCREW 5.0 70MM (Screw) ×1 IMPLANT
SCREW CORTICAL 5.0X95MM (Screw) ×3 IMPLANT
SCREW CORTICAL NCB 5.0X44 (Screw) ×2 IMPLANT
SCREW CORTICAL NCB 5.0X90MM (Screw) ×1 IMPLANT
SCREW FEM ST NCB 5X100 (Screw) ×1 IMPLANT
SCREW NCB 3.5X75X5X6.2XST (Screw) ×1 IMPLANT
SCREW NCB 4.0MX46M (Screw) ×1 IMPLANT
SCREW NCB 5.0X75MM (Screw) ×2 IMPLANT
STAPLER VISISTAT 35W (STAPLE) ×1 IMPLANT
SUT ETHILON 3 0 PS 1 (SUTURE) ×3 IMPLANT
SUT MNCRL AB 4-0 PS2 18 (SUTURE) ×1 IMPLANT
SUT MON AB 2-0 CT1 27 (SUTURE) ×1 IMPLANT
SUT VIC AB 0 CT1 27 (SUTURE) ×2
SUT VIC AB 0 CT1 27XBRD ANBCTR (SUTURE) ×3 IMPLANT
SUT VIC AB 2-0 CT1 27 (SUTURE) ×6
SUT VIC AB 2-0 CT1 TAPERPNT 27 (SUTURE) ×3 IMPLANT
SYR CONTROL 10ML LL (SYRINGE) ×1 IMPLANT
TOWEL GREEN STERILE (TOWEL DISPOSABLE) ×3 IMPLANT
TOWEL GREEN STERILE FF (TOWEL DISPOSABLE) ×2 IMPLANT
TOWEL OR NON WOVEN STRL DISP B (DISPOSABLE) ×1 IMPLANT
TRAY FOLEY MTR SLVR 16FR STAT (SET/KITS/TRAYS/PACK) IMPLANT
WATER STERILE IRR 1000ML POUR (IV SOLUTION) ×3 IMPLANT

## 2023-04-22 NOTE — Interval H&P Note (Signed)
The patient has been re-examined, and the chart reviewed, and there have been no interval changes to the documented history and physical.    Plan for R distal femur fracture ORIF today.  The operative side was examined and the patient was confirmed to have sensation to DPN, SPN, TN intact, Motor EHL, ext, flex 5/5, and DP 2+, PT 2+, No significant edema.   The risks, benefits, and alternatives have been discussed at length with patient, and the patient is willing to proceed.  Right leg marked. Consent has been signed.

## 2023-04-22 NOTE — Plan of Care (Signed)
  Problem: Education: Goal: Knowledge of General Education information will improve Description: Including pain rating scale, medication(s)/side effects and non-pharmacologic comfort measures Outcome: Progressing   Problem: Nutrition: Goal: Adequate nutrition will be maintained Outcome: Progressing   Problem: Coping: Goal: Level of anxiety will decrease Outcome: Progressing   Problem: Skin Integrity: Goal: Risk for impaired skin integrity will decrease Outcome: Progressing   Problem: Coping: Goal: Ability to adjust to condition or change in health will improve Outcome: Progressing   Problem: Skin Integrity: Goal: Risk for impaired skin integrity will decrease Outcome: Progressing   Problem: Metabolic: Goal: Ability to maintain appropriate glucose levels will improve Outcome: Progressing

## 2023-04-22 NOTE — Progress Notes (Signed)
PROGRESS NOTE   Danny Brady  ZOX:[id number] DOB: Feb 24, 1927 DOA: 04/22/2023 PCP: Danella Penton, MD   Date of Service: the patient was seen and examined on 04/22/2023  Brief Narrative:   Danny Brady is a 89 y.o. male with medical history significant for hypertension, hyperlipidemia, HFrEF, CVA, paroxysmal A-fib not anticoagulated who presented after a mechanical fall.  Uses a walker at baseline.  He fell after a turn with his walker.  He was in his usual state of health prior to this.   In the ED he was found to have periprosthetic distal right femur fracture.  [name] discussed the case with orthopedic surgery Dr. Gaynell Face regarding who recommended admission by the hospitalist service to [facility] for possible ORIF on 04/22/2023.  The patient was made NPO.  The patient was accepted by Dr. Antionette Char and transferred to [facility] MedSurg unit as inpatient status.\  Assessment and Plan:  Periprosthetic distal right femur fracture, post mechanical fall. Admitted this morning. -ORIF by orthopedic surgery Dr. Blanchie Dessert today -N.p.o. -PT OT -CBC BMP a.m.   Chronic normocytic anemia Hg 10, at baseline 7-11. No reported overt bleeding by previous physician. -Type and screen -Monitor H&H and transfuse hemoglobin less than 8.0.   Hyperglycemia Glucose 133 on BMP. Obtain hemoglobin A1c -Start insulin sliding scale every 4 hours while NPO for hyperglycemia   Paroxysmal A-fib not on anticoagulation Rate is controlled -Monitor on remote telemetry   Chronic HFrEF Euvolemic. Denies any anginal symptoms. -Strict I's and O's and daily weight   Chronic anxiety/depression -Continue Zoloft   GERD -Continue PPI  Subjective:  Patient not in room, was in the OR  Physical Exam:  Vitals:   04/22/23 0448 04/22/23 0650 04/22/23 0741  BP: 94/63    Pulse: 77    Resp: 18    Temp: 98.3 F (36.8 C)    TempSrc: Oral    SpO2:   96%  Weight: 93 kg 94.1 kg   Height: 6' (1.829  m) 6' (1.829 m)     No examination as patient was not in room  Data Reviewed:  I have personally reviewed and interpreted labs, imaging.   CBC: Recent Labs  Lab 04/21/23 2214 04/22/23 0658  WBC 9.4 9.0  NEUTROABS 7.9*  --   HGB 9.4* 10.0*  HCT 28.5* 30.5*  MCV 98.6 100.0  PLT 136* 127*   Basic Metabolic Panel: Recent Labs  Lab 04/21/23 2214 04/22/23 0658  NA 135 134*  K 4.4 4.3  CL 100 97*  CO2 26 24  GLUCOSE 166* 133*  BUN 21 21  CREATININE 1.04 1.22  CALCIUM 8.8* 8.8*  MG  --  2.2  PHOS  --  4.7*   GFR: Estimated Creatinine Clearance: 44.1 mL/min (by C-G formula based on SCr of 1.22 mg/dL). Liver Function Tests: Recent Labs  Lab 04/21/23 2214  AST 19  ALT 12  ALKPHOS 54  BILITOT 1.1  PROT 5.9*  ALBUMIN 3.2*    Coagulation Profile: No results for input(s): "INR", "PROTIME" in the last 168 hours.  Code Status:  Full code.  Code status decision has been confirmed with: patient  Family Communication:     Severity of Illness:  The appropriate patient status for this patient is INPATIENT. Inpatient status is judged to be reasonable and necessary in order to provide the required intensity of service to ensure the patient's safety. The patient's presenting symptoms, physical exam findings, and initial radiographic and laboratory data in the  context of their chronic comorbidities is felt to place them at high risk for further clinical deterioration. Furthermore, it is not anticipated that the patient will be medically stable for discharge from the hospital within 2 midnights of admission.   * I certify that at the point of admission it is my clinical judgment that the patient will require inpatient hospital care spanning beyond 2 midnights from the point of admission due to high intensity of service, high risk for further deterioration and high frequency of surveillance required.*   Author:  Rolm Gala MD  04/22/2023 8:33 AM

## 2023-04-22 NOTE — Anesthesia Procedure Notes (Signed)
Procedure Name: Intubation Date/Time: 04/22/2023 11:25 AM  Performed by: Cy Blamer, CRNAPre-anesthesia Checklist: Patient identified, Emergency Drugs available, Suction available and Patient being monitored Patient Re-evaluated:Patient Re-evaluated prior to induction Oxygen Delivery Method: Circle system utilized Preoxygenation: Pre-oxygenation with 100% oxygen Induction Type: IV induction Ventilation: Mask ventilation without difficulty Laryngoscope Size: Miller and 2 Grade View: Grade I Tube type: Oral Tube size: 7.5 mm Number of attempts: 1 Airway Equipment and Method: Stylet Placement Confirmation: ETT inserted through vocal cords under direct vision, positive ETCO2 and breath sounds checked- equal and bilateral Secured at: 22 cm Tube secured with: Tape Dental Injury: Teeth and Oropharynx as per pre-operative assessment

## 2023-04-22 NOTE — Consult Note (Signed)
ORTHOPAEDIC CONSULTATION  REQUESTING PHYSICIAN: Patel, Poonamkumari J, MD  Chief Complaint: Fall with right femur fracture  HPI: Danny Brady is a 89 y.o. male who sustained a fall yesterday at home injuring his right leg.  He endorses pain about the right distal thigh and knee..  He uses a walker at baseline.  He has history of an intertroch fracture treated with soft medullary nail 5 years ago.  He also has history of a knee replacement done over 20 years ago.  He has 2 full-time caregivers with him at home.  He denies pain in the joints or extremities.  He denies distal numbness and tingling.  Past Medical History:  Diagnosis Date   Anemia    Anxiety    Edema    GERD (gastroesophageal reflux disease)    Hypertension    Past Surgical History:  Procedure Laterality Date   BLADDER SURGERY     HERNIA REPAIR     INTRAMEDULLARY (IM) NAIL INTERTROCHANTERIC Right 11/20/2019   Procedure: INTRAMEDULLARY (IM) NAIL INTERTROCHANTRIC;  Surgeon: Bowers, James R, MD;  Location: [facility] ORS;  Service: Orthopedics;  Laterality: Right;   Social History   Socioeconomic History   Marital status: Widowed    Spouse name: Not on file   Number of children: Not on file   Years of education: Not on file   Highest education level: Not on file  Occupational History   Not on file  Tobacco Use   Smoking status: Never   Smokeless tobacco: Never  Vaping Use   Vaping Use: Never used  Substance and Sexual Activity   Alcohol use: Not Currently   Drug use: Never   Sexual activity: Not on file  Other Topics Concern   Not on file  Social History Narrative   Not on file   Social Determinants of Health   Financial Resource Strain: Not on file  Food Insecurity: No Food Insecurity (04/22/2023)   Hunger Vital Sign    Worried About Running Out of Food in the Last Year: Never true    Ran Out of Food in the Last Year: Never true  Transportation Needs: No Transportation Needs (04/22/2023)   PRAPARE -  Transportation    Lack of Transportation (Medical): No    Lack of Transportation (Non-Medical): No  Physical Activity: Not on file  Stress: Not on file  Social Connections: Not on file   No family history on file. Allergies  Allergen Reactions   Prednisone Other (See Comments)    insomnia   Tamsulosin Other (See Comments)    Unknown insomnia      Positive ROS: All other systems have been reviewed and were otherwise negative with the exception of those mentioned in the HPI and as above.  Physical Exam: General: Alert, no acute distress Cardiovascular: No pedal edema Respiratory: No cyanosis, no use of accessory musculature Skin: No lesions in the area of chief complaint Neurologic: Sensation intact distally Psychiatric: Patient is competent for consent with normal mood and affect  MUSCULOSKELETAL:  RLE No traumatic wounds, ecchymosis, or rash  Tender about the distal thigh  No groin pain with log roll  No knee or ankle effusion  Sens DPN, SPN, TN intact  Motor EHL, ext, flex 5/5  Foot is warm and well-perfused  LLE No traumatic wounds, ecchymosis, or rash  Nontender  No groin pain with log roll  No knee or ankle effusion  Sens DPN, SPN, TN intact  Motor EHL, ext, flex 5/5  Foot is   warm and well-perfused   IMAGING: X-rays and CT scan of right femur reviewed demonstrates periprosthetic distal femur fracture around the intramedullary nail which has no distal interlocks.  Total knee arthroplasty components appear to be in good position without evidence of loosening or failure.  Intertrochanteric fracture appears to be well-healed with some posttraumatic arthritis otherwise no adverse features  Assessment: Right periprosthetic distal femur fracture   Plan: Discussed with patient and his family today at bedside that he has a periprosthetic distal femur fracture.  Discussed still with nonoperative treatment would be difficult to immobilize the fracture and would take 6  to 8 weeks of nonweightbearing.  Recommend surgical treatment to allow for earlier mobility, better stabilization of the fracture, and better healing of the fracture.  Will plan for open reduction, and internal fixation of the fracture with distal femoral locking plate.The risks benefits and alternatives were discussed with the patient including but not limited to the risks of nonoperative treatment, versus surgical intervention including infection, bleeding, nerve injury, malunion, nonunion, the need for revision surgery, hardware prominence, hardware failure, the need for hardware removal, blood clots, cardiopulmonary complications, morbidity, mortality, among others, and they were willing to proceed.      [name] A Peg Fifer, MD  Contact information:   Weekdays 7am-5pm epic message Dr. Jakin Pavao, or call office for patient follow up: [phone] After hours and holidays please check [email or web address] for group call information for [facility] 

## 2023-04-22 NOTE — Anesthesia Preprocedure Evaluation (Addendum)
Anesthesia Evaluation  Patient identified by MRN, date of birth, ID band Patient awake    Reviewed: Allergy & Precautions, NPO status , Patient's Chart, lab work & pertinent test results  History of Anesthesia Complications Negative for: history of anesthetic complications  Airway Mallampati: II  TM Distance: >3 FB Neck ROM: Full    Dental  (+) Dental Advisory Given, Partial Lower, Partial Upper, Loose,    Pulmonary neg pulmonary ROS   Pulmonary exam normal        Cardiovascular hypertension, Pt. on medications +CHF  Normal cardiovascular exam+ dysrhythmias Atrial Fibrillation + Valvular Problems/Murmurs MR    '17 TTE - EF 45%. Moderately dilated LA, mildly dilated RA. Moderate MR and TR. Mild AI, trivial PR. No stenotic valves    Neuro/Psych  PSYCHIATRIC DISORDERS Anxiety     negative neurological ROS     GI/Hepatic Neg liver ROS,GERD  Medicated and Controlled,,  Endo/Other  negative endocrine ROS    Renal/GU negative Renal ROS Bladder dysfunction      Musculoskeletal negative musculoskeletal ROS (+)    Abdominal   Peds  Hematology  (+) Blood dyscrasia, anemia  Plt 136k    Anesthesia Other Findings   Reproductive/Obstetrics                             Anesthesia Physical Anesthesia Plan  ASA: 3  Anesthesia Plan: General   Post-op Pain Management: Tylenol PO (pre-op)*   Induction: Intravenous  PONV Risk Score and Plan: 2 and Treatment may vary due to age or medical condition, Ondansetron and TIVA  Airway Management Planned: Oral ETT  Additional Equipment: None  Intra-op Plan:   Post-operative Plan: Extubation in OR  Informed Consent: I have reviewed the patients History and Physical, chart, labs and discussed the procedure including the risks, benefits and alternatives for the proposed anesthesia with the patient or authorized representative who has indicated his/her  understanding and acceptance.     Dental advisory given  Plan Discussed with: CRNA and Anesthesiologist  Anesthesia Plan Comments:         Anesthesia Quick Evaluation

## 2023-04-22 NOTE — Anesthesia Postprocedure Evaluation (Signed)
Anesthesia Post Note  Patient: Danny Brady  Procedure(s) Performed: OPEN REDUCTION INTERNAL FIXATION (ORIF) PERIPROSTHETIC FRACTURE (Right: Leg Upper)     Patient location during evaluation: PACU Anesthesia Type: General Level of consciousness: awake and alert Pain management: pain level controlled Vital Signs Assessment: post-procedure vital signs reviewed and stable Respiratory status: spontaneous breathing, nonlabored ventilation, respiratory function stable and patient connected to nasal cannula oxygen Cardiovascular status: stable Postop Assessment: no apparent nausea or vomiting Anesthetic complications: no   No notable events documented.  Last Vitals:  Vitals:   04/22/23 1645 04/22/23 1650  BP: 94/63 (!) 99/49  Pulse: 74 74  Resp: 15 15  Temp:  (!) 36.4 C  SpO2: 95% 98%    Last Pain:  Vitals:   04/22/23 1650  TempSrc:   PainSc: 0-No pain        RLE Motor Response: Purposeful movement (04/22/23 1715) RLE Sensation: Full sensation (04/22/23 1715)      Beryle Lathe

## 2023-04-22 NOTE — Progress Notes (Signed)
Patient with periprosthetic femur fracture around TKA and IMN. Likely would benefit from ORIF. Patient at [facility] to be transferred to [facility]. Please keep NPO for possible OR today. Full consult note to follow once transferred.

## 2023-04-22 NOTE — Op Note (Addendum)
04/22/2023  1:44 PM  PATIENT:  Danny Brady    PRE-OPERATIVE DIAGNOSIS:  RIGHT PERI-PROSTHETIC DISTAL FEMUR FRACTURE  POST-OPERATIVE DIAGNOSIS:  Same  PROCEDURE:  OPEN REDUCTION INTERNAL FIXATION (ORIF) PERIPROSTHETIC FRACTURE  SURGEON:  Nasiyah Laverdiere A Alean Kromer, MD  PHYSICIAN ASSISTANT: Kathie Dike, PA-C, present and scrubbed throughout the case, critical for completion in a timely fashion, and for retraction, instrumentation, and closure.  ANESTHESIA:   General  PREOPERATIVE INDICATIONS:  Danny Brady is a  89 y.o. male who sustained a fall at home yesterday.  Was seen at [facility] emergency room where x-rays demonstrated a periprosthetic distal femur fracture around an intramedullary nail and total knee prosthesis.  The on-call orthopedic surgeon there felt that the fracture was out of his scope of practice.  Patient was ultimately transferred to [facility] for further management.  Given the nature of the fracture recommended surgical treatment for stabilization and mobility.  The risks benefits and alternatives were discussed with the patient preoperatively including but not limited to the risks of infection, bleeding, nerve injury, cardiopulmonary complications, the need for revision surgery, among others, and the patient was willing to proceed.  ESTIMATED BLOOD LOSS: 250cc  OPERATIVE IMPLANTS: 15 hole NCB distal femur locking plate, 3 proximal screws, 6 distal screws  OPERATIVE FINDINGS: Successful reduction fixation of periprosthetic distal femur fracture around the intramedullary nail and total knee prosthesis  OPERATIVE PROCEDURE:   The patient was identified in the preoperative holding area. Consent was confirmed with the patient and their family and all questions were answered. The operative extremity was marked after confirmation with the patient. The patient was then brought back to the operating room by our anesthesia colleagues. The patient was placed under  general anesthesia and was carefully transferred over to a radiolucent flattop table. They were secured to the bed and all bony prominences were padded.  A bump was placed under the operative hip. The operative extremity was then prepped and draped in usual sterile fashion. A timeout was performed to verify the patient, the procedure and extremity. Preoperative antibiotics were dosed.   Fluoroscopy was used to identify the fracture. A lateral approach was made to the distal femur. It was taken down through the skin and the IT band was incised in line with the incision. The distal portion of the vastus lateralis was reflected off the IT band and the distal articular block of the lateral femoral condyle was exposed.  An attempt was made to keep the fracture from being stripped or devitalized. I used fluoroscopy to identify the correct length plate to bridge the whole femoral shaft. I chose a 15-hole plate. The aiming arm was attached to the plate and slide submuscularly under the vastus to the proximal femur. The distal portion of the plate was placed flush against the lateral cortex of the distal articular block. A 3.10mm drill bit was used to position the proximal portion of the plate. A perfect lateral was obtained to show appropriate positioning of the plate.   The distal segment was drilled and 5.41mm cortical screws were placed in the distal fracture segment. A total of 4 screws were placed initially. A 4.0 mm screw was placed percutaneously in the shaft to reduce the coronal alignment. A total of three shaft screws were placed to allow for a significant working length and micromotion of the fracture. Locking caps were placed in the shaft screws. The targeting guide was removed. Two more screws were placed in the distal segment. Fluoroscopy was  used to confirm adequate reduction of the fracture and appropriate position of the plate and screws.   The incisions were irrigated with normal saline. A gram of  vancomycin powder was placed in the incision around the plate.  30 cc 0.25% Marcaine was injected into the wounds.  The IT band was closed with 0-vicryl. The skin was closed with 2-vicryl, 3-0 nylon. The incisions were dressed with a Mepilex dressing, sterile cast padding and the leg was wrapped with a ACE wrap. The patient was then transferred to the regular floor bed and taken to the PACU in stable condition.  Post op recs: WB: WBAT RLE Abx: ancef x23 hours post op Imaging: PACU xrays Dressing: keep intact until follow up, change PRN if soiled or saturated. DVT prophylaxis: lovenox in house, dc on aspirin 81mg  BID Follow up: 2 weeks after surgery for a wound check with Dr. Blanchie Dessert at [facility].  Address: [address], [place], [place] [address]  Office Phone: [phone]  Weber Cooks, MD Orthopaedic Surgery

## 2023-04-22 NOTE — Progress Notes (Signed)
Plan of Care Note for accepted transfer   Patient: Danny Brady MRN: [id number]   DOA: 04/21/2023  Facility requesting transfer: [facility]   Requesting Provider: Dr. Katrinka Blazing   Reason for transfer: Right distal femur fracture, periprosthetic   Facility course: 89 yr old man with hx of HTN, HLD, HFmrEF, CVA, and PAF not anticoagulated who presents with right leg pain after a slip/fall and is found to have periprosthetic distal right femur fracture.   Orthopedic surgery (Dr. Blanchie Dessert) recommended hospitalists admit to [facility] for likely ORIF.   Plan of care: The patient is accepted for admission to Med-surg  unit, at [facility].   Author: Briscoe Deutscher, MD 04/22/2023  Check [email or web address] for on-call coverage.  Nursing staff, Please call [facility] Admits & Consults System-Wide number on Amion as soon as patient's arrival, so appropriate admitting provider can evaluate the pt.

## 2023-04-22 NOTE — H&P (View-Only) (Signed)
ORTHOPAEDIC CONSULTATION  REQUESTING PHYSICIAN: Cathleen Corti, MD  Chief Complaint: Fall with right femur fracture  HPI: Danny Brady is a 89 y.o. male who sustained a fall yesterday at home injuring his right leg.  He endorses pain about the right distal thigh and knee.Marland Kitchen  He uses a walker at baseline.  He has history of an intertroch fracture treated with soft medullary nail 5 years ago.  He also has history of a knee replacement done over 20 years ago.  He has 2 full-time caregivers with him at home.  He denies pain in the joints or extremities.  He denies distal numbness and tingling.  Past Medical History:  Diagnosis Date   Anemia    Anxiety    Edema    GERD (gastroesophageal reflux disease)    Hypertension    Past Surgical History:  Procedure Laterality Date   BLADDER SURGERY     HERNIA REPAIR     INTRAMEDULLARY (IM) NAIL INTERTROCHANTERIC Right 11/20/2019   Procedure: INTRAMEDULLARY (IM) NAIL INTERTROCHANTRIC;  Surgeon: Lyndle Herrlich, MD;  Location: [facility] ORS;  Service: Orthopedics;  Laterality: Right;   Social History   Socioeconomic History   Marital status: Widowed    Spouse name: Not on file   Number of children: Not on file   Years of education: Not on file   Highest education level: Not on file  Occupational History   Not on file  Tobacco Use   Smoking status: Never   Smokeless tobacco: Never  Vaping Use   Vaping Use: Never used  Substance and Sexual Activity   Alcohol use: Not Currently   Drug use: Never   Sexual activity: Not on file  Other Topics Concern   Not on file  Social History Narrative   Not on file   Social Determinants of Health   Financial Resource Strain: Not on file  Food Insecurity: No Food Insecurity (04/22/2023)   Hunger Vital Sign    Worried About Running Out of Food in the Last Year: Never true    Ran Out of Food in the Last Year: Never true  Transportation Needs: No Transportation Needs (04/22/2023)   PRAPARE -  Administrator, Civil Service (Medical): No    Lack of Transportation (Non-Medical): No  Physical Activity: Not on file  Stress: Not on file  Social Connections: Not on file   No family history on file. Allergies  Allergen Reactions   Prednisone Other (See Comments)    insomnia   Tamsulosin Other (See Comments)    Unknown insomnia      Positive ROS: All other systems have been reviewed and were otherwise negative with the exception of those mentioned in the HPI and as above.  Physical Exam: General: Alert, no acute distress Cardiovascular: No pedal edema Respiratory: No cyanosis, no use of accessory musculature Skin: No lesions in the area of chief complaint Neurologic: Sensation intact distally Psychiatric: Patient is competent for consent with normal mood and affect  MUSCULOSKELETAL:  RLE No traumatic wounds, ecchymosis, or rash  Tender about the distal thigh  No groin pain with log roll  No knee or ankle effusion  Sens DPN, SPN, TN intact  Motor EHL, ext, flex 5/5  Foot is warm and well-perfused  LLE No traumatic wounds, ecchymosis, or rash  Nontender  No groin pain with log roll  No knee or ankle effusion  Sens DPN, SPN, TN intact  Motor EHL, ext, flex 5/5  Foot is  warm and well-perfused   IMAGING: X-rays and CT scan of right femur reviewed demonstrates periprosthetic distal femur fracture around the intramedullary nail which has no distal interlocks.  Total knee arthroplasty components appear to be in good position without evidence of loosening or failure.  Intertrochanteric fracture appears to be well-healed with some posttraumatic arthritis otherwise no adverse features  Assessment: Right periprosthetic distal femur fracture   Plan: Discussed with patient and his family today at bedside that he has a periprosthetic distal femur fracture.  Discussed still with nonoperative treatment would be difficult to immobilize the fracture and would take 6  to 8 weeks of nonweightbearing.  Recommend surgical treatment to allow for earlier mobility, better stabilization of the fracture, and better healing of the fracture.  Will plan for open reduction, and internal fixation of the fracture with distal femoral locking plate.The risks benefits and alternatives were discussed with the patient including but not limited to the risks of nonoperative treatment, versus surgical intervention including infection, bleeding, nerve injury, malunion, nonunion, the need for revision surgery, hardware prominence, hardware failure, the need for hardware removal, blood clots, cardiopulmonary complications, morbidity, mortality, among others, and they were willing to proceed.      Joen Laura, MD  Contact information:   [id number] 7am-5pm epic message Dr. Blanchie Dessert, or call office for patient follow up: [phone] After hours and holidays please check [email or web address] for group call information for [facility]

## 2023-04-22 NOTE — H&P (Addendum)
History and Physical  Danny Brady BJY:[id number] DOB: 08-Apr-1927 DOA: 04/22/2023  Referring physician: Accepted by Dr. Antionette Char, [name]  PCP: Danella Penton, MD  Outpatient Specialists: Orthopedic surgery Patient coming from: Home through [facility].  Chief Complaint: Fall  HPI: Danny Brady is a 89 y.o. male with medical history significant for hypertension, hyperlipidemia, HFrEF, CVA, paroxysmal A-fib not anticoagulated who presented after a mechanical fall.  Uses a walker at baseline.  He fell after a turn with his walker.  He was in his usual state of health prior to this.  In the ED he was found to have periprosthetic distal right femur fracture.  [name] discussed the case with orthopedic surgery Dr. Gaynell Face regarding who recommended admission by the hospitalist service to [facility] for possible ORIF on 04/22/2023.  The patient was made NPO.  The patient was accepted by Dr. Antionette Char and transferred to [facility] MedSurg unit as inpatient status.  At the time of this visit, the patient is alert and oriented x 3.  He denies any cardiopulmonary symptoms and has no new complaints.  Denies pain in his lower extremities at this time.  States his right leg hurts only when he moves.  ED Course: Tmax 98.3.  BP 94/63, pulse 77.  Respiratory 18.  O2 saturation 94% on room air.  Lab studies notable for serum glucose 166.  Hemoglobin 9.4.  Platelet count 136.  Review of Systems: Review of systems as noted in the HPI. All other systems reviewed and are negative.   Past Medical History:  Diagnosis Date   Anemia    Anxiety    Edema    GERD (gastroesophageal reflux disease)    Hypertension    Past Surgical History:  Procedure Laterality Date   BLADDER SURGERY     HERNIA REPAIR     INTRAMEDULLARY (IM) NAIL INTERTROCHANTERIC Right 11/20/2019   Procedure: INTRAMEDULLARY (IM) NAIL INTERTROCHANTRIC;  Surgeon: Lyndle Herrlich, MD;  Location: [facility] ORS;  Service: Orthopedics;  Laterality: Right;     Social History:  reports that he has never smoked. He has never used smokeless tobacco. He reports that he does not currently use alcohol. He reports that he does not use drugs.   Allergies  Allergen Reactions   Prednisone Other (See Comments)    insomnia   Tamsulosin Other (See Comments)    Unknown insomnia     Family history: None reported.  Prior to Admission medications   Medication Sig Start Date End Date Taking? Authorizing Provider  acetaminophen (TYLENOL) 500 MG tablet Take 1 tablet (500 mg total) by mouth every 8 (eight) hours as needed. 12/16/19   Enedina Finner, MD  ALPRAZolam Prudy Feeler) 0.25 MG tablet Take 0.25 mg by mouth daily as needed (anxiety).  12/24/19   [provider]  aspirin EC 81 MG EC tablet Take 1 tablet (81 mg total) by mouth daily. 11/22/19   Altamese Cabal, PA-C  finasteride (PROSCAR) 5 MG tablet Take 5 mg by mouth every evening.  11/12/19   [provider]  furosemide (LASIX) 20 MG tablet Take 1 tablet (20 mg total) by mouth daily. Patient not taking: Reported on 05/01/2022 12/16/19   Enedina Finner, MD  furosemide (LASIX) 20 MG tablet Take 2 tablets by mouth daily. 11/03/15   [provider]  HYDROcodone-acetaminophen (NORCO) 5-325 MG tablet Take 1 tablet by mouth every 6 (six) hours as needed for up to 6 doses for moderate pain. 01/17/20   Sung Amabile, DO  ibuprofen (ADVIL) 400 MG tablet Take 1 tablet (400 mg total) by mouth every 8 (eight) hours as needed for mild pain or moderate pain. 01/17/20   Sung Amabile, DO  Magnesium 250 MG TABS Take 250 mg by mouth daily.    [provider]  omeprazole (PRILOSEC) 20 MG capsule Take 20 mg by mouth daily. 10/31/19   [provider]  ondansetron (ZOFRAN) 4 MG tablet Take 4 mg by mouth every 8 (eight) hours as needed for nausea/vomiting. Patient not taking: Reported on 05/01/2022 12/13/19   [provider]  potassium chloride (KLOR-CON) 10 MEQ tablet Take 10 mEq by mouth daily.  11/08/21   [provider]  potassium chloride SA (KLOR-CON) 10 MEQ tablet Take 1 tablet (10 mEq total) by mouth daily. Patient not taking: Reported on 05/01/2022 12/16/19   Enedina Finner, MD  sertraline (ZOLOFT) 50 MG tablet Take 50 mg by mouth daily. 03/14/22   [provider]  spironolactone (ALDACTONE) 25 MG tablet Take 12.5 mg by mouth daily. 01/19/22   [provider]    Physical Exam: BP 94/63   Pulse 77   Temp 98.3 F (36.8 C) (Oral)   Resp 18   Ht 6' (1.829 m)   Wt 93 kg   BMI 27.80 kg/m   General: 89 y.o. year-old male well developed well nourished in no acute distress.  Alert and oriented x3. Cardiovascular: Regular rate and rhythm with no rubs or gallops.  No thyromegaly or JVD noted.  Trace lower extremity edema bilaterally Respiratory: Clear to auscultation with no wheezes or rales. Good inspiratory effort. Abdomen: Soft nontender nondistended with normal bowel sounds x4 quadrants. Muskuloskeletal: No cyanosis, clubbing or edema noted bilaterally.  The right lower extremity is externally rotated. Neuro: CN II-XII intact, strength, sensation, reflexes Skin: No ulcerative lesions noted or rashes Psychiatry: Judgement and insight appear normal. Mood is appropriate for condition and setting          Labs on Admission:  Basic Metabolic Panel: Recent Labs  Lab 04/21/23 2214  NA 135  K 4.4  CL 100  CO2 26  GLUCOSE 166*  BUN 21  CREATININE 1.04  CALCIUM 8.8*   Liver Function Tests: Recent Labs  Lab 04/21/23 2214  AST 19  ALT 12  ALKPHOS 54  BILITOT 1.1  PROT 5.9*  ALBUMIN 3.2*   No results for input(s): "LIPASE", "AMYLASE" in the last 168 hours. No results for input(s): "AMMONIA" in the last 168 hours. CBC: Recent Labs  Lab 04/21/23 2214  WBC 9.4  NEUTROABS 7.9*  HGB 9.4*  HCT 28.5*  MCV 98.6  PLT 136*   Cardiac Enzymes: No results for input(s): "CKTOTAL", "CKMB", "CKMBINDEX", "TROPONINI" in the last 168 hours.  BNP (last  3 results) No results for input(s): "BNP" in the last 8760 hours.  ProBNP (last 3 results) No results for input(s): "PROBNP" in the last 8760 hours.  CBG: No results for input(s): "GLUCAP" in the last 168 hours.  Radiological Exams on Admission: CT FEMUR RIGHT WO CONTRAST  Result Date: 04/21/2023 CLINICAL DATA:  Distal femoral fracture EXAM: CT OF THE LOWER RIGHT EXTREMITY WITHOUT CONTRAST TECHNIQUE: Multidetector CT imaging of the right lower extremity was performed according to the standard protocol. RADIATION DOSE REDUCTION: This exam was performed according to the departmental dose-optimization program which includes automated exposure control, adjustment of the mA and/or kV according to patient size and/or use of iterative reconstruction technique. COMPARISON:  None Available. FINDINGS: Bones/Joint/Cartilage Changes consistent with prior  operative fixation of a proximal right femoral fracture is noted. No new proximal femoral fracture is seen. There is however a comminuted fracture involving the distal femoral shaft extending into the metaphysis with mild displacement of the fracture fragments. The fracture lines anteriorly extend to just above the left knee prosthesis. Proximal tibia and fibula appear limits. Ligaments Suboptimally assessed by CT. Muscles and Tendons Calf musculature shows some fatty replacement consistent with atrophy. Musculature of the thigh shows some mild edema related to the recent fracture. No sizable hematoma is seen. Soft tissues Mild edema is noted surrounding the fracture site. No sizable hematoma is noted. Small joint effusion is seen. IMPRESSION: Oblique fracture through distal femoral diaphysis extending into the metaphysis just above the right knee prosthesis. Electronically Signed   By: Alcide Clever M.D.   On: 04/21/2023 19:54   DG Hips Bilat W or Wo Pelvis 3-4 Views  Result Date: 04/21/2023 CLINICAL DATA:  Larey Seat, right leg pain, deformity EXAM: DG HIP (WITH OR  WITHOUT PELVIS) 3-4V BILAT; RIGHT KNEE - COMPLETE 4+ VIEW COMPARISON:  11/20/2019 FINDINGS: Bilateral hips: Frontal view of the pelvis as well as frontal and lateral views of the bilateral hips are obtained. There is partial visualization of an acute oblique right femoral diaphyseal fracture incompletely evaluated on this exam. Intramedullary rod and proximal dynamic screw are seen within the right femur, with evidence of a prior healed intertrochanteric right hip fracture. There are no acute hip fractures. Symmetrical bilateral hip osteoarthritis. The remainder of the bony pelvis is unremarkable. Right knee: Frontal, bilateral oblique, lateral views of the right knee are obtained. Distal aspect of an intramedullary rod is seen within the right femur, with an oblique displaced fracture of the distal right femoral diaphysis. There is external rotation of the fracture site. Three component right knee arthroplasty is identified without evidence of complication. The fracture of the distal femoral diaphysis does not appear to involve the right knee prosthesis. Small joint effusion. Diffuse soft tissue swelling of the distal thigh and knee. IMPRESSION: 1. Acute displaced oblique fracture of the distal right femoral diaphysis. The fracture line involves the distal margin of the intramedullary rod from prior right hip ORIF. The fracture does not appear to involve the right knee prosthesis. 2. Small right knee effusion. 3. Diffuse soft tissue swelling of the distal right thigh and right knee. 4. Symmetrical bilateral hip osteoarthritis. Electronically Signed   By: Sharlet Salina M.D.   On: 04/21/2023 17:50   DG Knee Complete 4 Views Right  Result Date: 04/21/2023 CLINICAL DATA:  Larey Seat, right leg pain, deformity EXAM: DG HIP (WITH OR WITHOUT PELVIS) 3-4V BILAT; RIGHT KNEE - COMPLETE 4+ VIEW COMPARISON:  11/20/2019 FINDINGS: Bilateral hips: Frontal view of the pelvis as well as frontal and lateral views of the bilateral  hips are obtained. There is partial visualization of an acute oblique right femoral diaphyseal fracture incompletely evaluated on this exam. Intramedullary rod and proximal dynamic screw are seen within the right femur, with evidence of a prior healed intertrochanteric right hip fracture. There are no acute hip fractures. Symmetrical bilateral hip osteoarthritis. The remainder of the bony pelvis is unremarkable. Right knee: Frontal, bilateral oblique, lateral views of the right knee are obtained. Distal aspect of an intramedullary rod is seen within the right femur, with an oblique displaced fracture of the distal right femoral diaphysis. There is external rotation of the fracture site. Three component right knee arthroplasty is identified without evidence of complication. The fracture of the distal  femoral diaphysis does not appear to involve the right knee prosthesis. Small joint effusion. Diffuse soft tissue swelling of the distal thigh and knee. IMPRESSION: 1. Acute displaced oblique fracture of the distal right femoral diaphysis. The fracture line involves the distal margin of the intramedullary rod from prior right hip ORIF. The fracture does not appear to involve the right knee prosthesis. 2. Small right knee effusion. 3. Diffuse soft tissue swelling of the distal right thigh and right knee. 4. Symmetrical bilateral hip osteoarthritis. Electronically Signed   By: Sharlet Salina M.D.   On: 04/21/2023 17:50   DG Lumbar Spine 2-3 Views  Result Date: 04/21/2023 CLINICAL DATA:  fall, hip pain, knee pain EXAM: LUMBAR SPINE - 2-3 VIEW COMPARISON:  Abdomen pelvis 12/13/2019 FINDINGS: There is no evidence of lumbar spine fracture. Alignment is normal. Multilevel severe degenerative changes spine. Chronic compression fracture of the L1 and L3 vertebral body. Multilevel intervertebral disc space narrowing. Aortic calcification. IMPRESSION: 1. No acute displaced fracture or traumatic listhesis of the lumbar spine in  a patient with chronic L1 and L3 vertebral fractures. Limited evaluation due to overlapping osseous structures and overlying soft tissues. 2.  Aortic Atherosclerosis (ICD10-I70.0). Electronically Signed   By: Tish Frederickson M.D.   On: 04/21/2023 17:50   CT Head Wo Contrast  Result Date: 04/21/2023 CLINICAL DATA:  Head trauma, minor (Age >= 65y); Neck trauma (Age >= 65y) EXAM: CT HEAD WITHOUT CONTRAST CT CERVICAL SPINE WITHOUT CONTRAST TECHNIQUE: Multidetector CT imaging of the head and cervical spine was performed following the standard protocol without intravenous contrast. Multiplanar CT image reconstructions of the cervical spine were also generated. RADIATION DOSE REDUCTION: This exam was performed according to the departmental dose-optimization program which includes automated exposure control, adjustment of the mA and/or kV according to patient size and/or use of iterative reconstruction technique. COMPARISON:  None Available. FINDINGS: CT HEAD FINDINGS Brain: Cerebral ventricle sizes are concordant with the degree of cerebral volume loss. Cerebral ventricle sizes are concordant with the degree of cerebral volume loss. No evidence of large-territorial acute infarction. No parenchymal hemorrhage. No mass lesion. No extra-axial collection. No mass effect or midline shift. No hydrocephalus. Basilar cisterns are patent. Vascular: No hyperdense vessel. Skull: No acute fracture or focal lesion. Sinuses/Orbits: Paranasal sinuses and mastoid air cells are clear. Bilateral lens replacement. Otherwise the orbits are unremarkable. Other: None. CT CERVICAL SPINE FINDINGS Alignment: Grade 1 anterolisthesis C5 on C6, C6 on C7, C7 on T1. Skull base and vertebrae: Multilevel moderate degenerative changes of the spine. Associated severe osseous foraminal on the left C3-C4 level. No acute fracture. No aggressive appearing focal osseous lesion or focal pathologic process. Densely sclerotic lesion of the T2 level likely a  bone island. Soft tissues and spinal canal: No prevertebral fluid or swelling. No visible canal hematoma. Upper chest: Unremarkable. Other: None. IMPRESSION: 1. No acute intracranial abnormality. 2. No acute displaced fracture or traumatic listhesis of the cervical spine. Electronically Signed   By: Tish Frederickson M.D.   On: 04/21/2023 17:14   CT Cervical Spine Wo Contrast  Result Date: 04/21/2023 CLINICAL DATA:  Head trauma, minor (Age >= 65y); Neck trauma (Age >= 65y) EXAM: CT HEAD WITHOUT CONTRAST CT CERVICAL SPINE WITHOUT CONTRAST TECHNIQUE: Multidetector CT imaging of the head and cervical spine was performed following the standard protocol without intravenous contrast. Multiplanar CT image reconstructions of the cervical spine were also generated. RADIATION DOSE REDUCTION: This exam was performed according to the departmental dose-optimization program which includes automated  exposure control, adjustment of the mA and/or kV according to patient size and/or use of iterative reconstruction technique. COMPARISON:  None Available. FINDINGS: CT HEAD FINDINGS Brain: Cerebral ventricle sizes are concordant with the degree of cerebral volume loss. Cerebral ventricle sizes are concordant with the degree of cerebral volume loss. No evidence of large-territorial acute infarction. No parenchymal hemorrhage. No mass lesion. No extra-axial collection. No mass effect or midline shift. No hydrocephalus. Basilar cisterns are patent. Vascular: No hyperdense vessel. Skull: No acute fracture or focal lesion. Sinuses/Orbits: Paranasal sinuses and mastoid air cells are clear. Bilateral lens replacement. Otherwise the orbits are unremarkable. Other: None. CT CERVICAL SPINE FINDINGS Alignment: Grade 1 anterolisthesis C5 on C6, C6 on C7, C7 on T1. Skull base and vertebrae: Multilevel moderate degenerative changes of the spine. Associated severe osseous foraminal on the left C3-C4 level. No acute fracture. No aggressive appearing  focal osseous lesion or focal pathologic process. Densely sclerotic lesion of the T2 level likely a bone island. Soft tissues and spinal canal: No prevertebral fluid or swelling. No visible canal hematoma. Upper chest: Unremarkable. Other: None. IMPRESSION: 1. No acute intracranial abnormality. 2. No acute displaced fracture or traumatic listhesis of the cervical spine. Electronically Signed   By: Tish Frederickson M.D.   On: 04/21/2023 17:14    EKG: I independently viewed the EKG done and my findings are as followed: None available at the time of this visit.  Assessment/Plan Present on Admission:  Femur fracture, right (HCC)  Principal Problem:   Femur fracture, right (HCC)  Periprosthetic distal right femur fracture, post mechanical fall.  Possible ORIF by orthopedic surgery Dr. Blanchie Dessert on 04/22/2023 N.p.o. As needed pain control and bowel regimen  Chronic normocytic anemia Presented with hemoglobin of 9.4, baseline No reported overt bleeding Type and screen Monitor H&H and transfuse hemoglobin less than 8.0.  Hyperglycemia Presented with blood glucose of 166, likely contributed by trauma, bone fracture Obtain hemoglobin A1c Start insulin sliding scale every 4 hours while NPO for hyperglycemia  Paroxysmal A-fib not on anticoagulation Rate is controlled Monitor on remote telemetry  Chronic HFrEF Euvolemic Denies any anginal symptoms. Strict I's and O's and daily weight  Chronic anxiety/depression Resume home Zoloft  GERD Resume home PPI   Time: 75 minutes.    DVT prophylaxis: Defer chemical DVT prophylaxis to orthopedic surgery  Code Status: Full code, stated by the patient himself.  Family Communication: None at bedside  Disposition Plan: Admitted to MedSurg unit  Consults called: Orthopedic surgery  Admission status: Inpatient status.   Status is: Inpatient The patient requires at least 2 midnights for further evaluation and treatment of present  condition.   Darlin Drop MD [facility] Pager [phone]  If 7PM-7AM, please contact night-coverage [email or web address] Password [facility]  04/22/2023, 5:58 AM

## 2023-04-22 NOTE — ED Notes (Signed)
Called [facility] for possible transfer per Dr. Smith 

## 2023-04-22 NOTE — ED Notes (Signed)
Called [facility] for update, they have re paged ortho

## 2023-04-22 NOTE — Transfer of Care (Signed)
Immediate Anesthesia Transfer of Care Note  Patient: Danny Brady  Procedure(s) Performed: OPEN REDUCTION INTERNAL FIXATION (ORIF) PERIPROSTHETIC FRACTURE (Right: Leg Upper)  Patient Location: PACU  Anesthesia Type:General  Level of Consciousness: awake, alert , and oriented  Airway & Oxygen Therapy: Patient Spontanous Breathing and Patient connected to nasal cannula oxygen  Post-op Assessment: Report given to RN, Post -op Vital signs reviewed and unstable, Anesthesiologist notified, Patient moving all extremities X 4, and Patient able to stick tongue midline  Post vital signs: Reviewed  Last Vitals:  Vitals Value Taken Time  BP 71/43 04/22/23 1401  Temp 98.6   Pulse 75 04/22/23 1403  Resp 15 04/22/23 1403  SpO2 96 % 04/22/23 1403  Vitals shown include unvalidated device data.  Last Pain:  Vitals:   04/22/23 0840  TempSrc:   PainSc: 8       Patients Stated Pain Goal: 2 (04/22/23 0335)  Complications: No notable events documented.

## 2023-04-22 NOTE — Discharge Instructions (Signed)
Orthopedic Discharge Instructions  Diet: As you were doing prior to hospitalization   Shower:  May shower but keep the wounds dry, use an occlusive plastic wrap, NO SOAKING IN TUB.  If the bandage gets wet, change with a clean dry gauze.  If you have a splint on, leave the splint in place and keep the splint dry with a plastic bag.  Dressing:  You may change your dressing 3-5 days after surgery, unless you have a splint.  If the dressing remains clean and dry it can also be left on until follow up. If you change the dressing replace with clean gauze and tape or ace wrap.  We will plan to remove your stitches in about 2 weeks in the office.  Activity:  Increase activity slowly as tolerated, but follow the weight bearing instructions below.  The rules on driving is that you can not be taking narcotics while you drive, and you must feel in control of the vehicle.    Weight Bearing:   You may bear weight on your right leg as tolerated.    Blood clot prevention (DVT Prophylaxis): After surgery you are at an increased risk for a blood clot. you were prescribed a blood thinner, Aspirin 81mg , to be taken twice daily for a total of 4 weeks from surgery to help reduce your risk of getting a blood clot. This will help prevent a blood clot. Signs of a pulmonary embolus (blood clot in the lungs) include sudden short of breath, feeling lightheaded or dizzy, chest pain with a deep breath, rapid pulse rapid breathing. Signs of a blood clot in your arms or legs include new unexplained swelling and cramping, warm, red or darkened skin around the painful area. Please call the office or 911 right away if these signs or symptoms develop.  To prevent constipation: you may use a stool softener such as - Colace (over the counter) 100 mg by mouth twice a day  Drink plenty of fluids (prune juice may be helpful) and high fiber foods Miralax (over the counter) for constipation as needed.    Itching:  If you experience  itching with your medications, try taking only a single pain pill, or even half a pain pill at a time.  You may take up to 10 pain pills per day, and you can also use benadryl over the counter for itching or also to help with sleep.   Precautions:  If you experience chest pain or shortness of breath - call 911 immediately for transfer to the hospital emergency department!!   Call office [phone]) for the following: Temperature greater than 101F Persistent nausea and vomiting Severe uncontrolled pain Redness, tenderness, or signs of infection (pain, swelling, redness, odor or green/yellow discharge around the site) Difficulty breathing, headache or visual disturbances Hives Persistent dizziness or light-headedness Extreme fatigue Any other questions or concerns you may have after discharge  In an emergency, call 911 or go to an Emergency Department at a nearby hospital  Follow- Up Appointment:  Please call for an appointment to be seen approximately 2-3 week after surgery in [facility] with your surgeon Dr. Weber Cooks - [phone] Address: [address], [place], [place] [address]

## 2023-04-22 NOTE — OR Nursing (Signed)
Patient presents to PACU with hypotension. Patient has 2+ radial pulses, drowsy but oriented, and O2 saturation WDL.  Dr Mal Amabile to bedside. Orders received for 5% Albumin once in PACU.

## 2023-04-22 NOTE — Plan of Care (Signed)

## 2023-04-23 DIAGNOSIS — S7291XA Unspecified fracture of right femur, initial encounter for closed fracture: Secondary | ICD-10-CM | POA: Diagnosis not present

## 2023-04-23 LAB — CBC WITH DIFFERENTIAL/PLATELET
Abs Immature Granulocytes: 0.03 10*3/uL (ref 0.00–0.07)
Abs Immature Granulocytes: 0.05 10*3/uL (ref 0.00–0.07)
Basophils Absolute: 0 10*3/uL (ref 0.0–0.1)
Basophils Absolute: 0 10*3/uL (ref 0.0–0.1)
Basophils Relative: 1 %
Basophils Relative: 0 %
Eosinophils Absolute: 0.3 10*3/uL (ref 0.0–0.5)
Eosinophils Absolute: 0.1 10*3/uL (ref 0.0–0.5)
Eosinophils Relative: 5 %
Eosinophils Relative: 1 %
HCT: 18.1 % — ABNORMAL LOW (ref 39.0–52.0)
HCT: 21 % — ABNORMAL LOW (ref 39.0–52.0)
Hemoglobin: 5.9 g/dL — CL (ref 13.0–17.0)
Hemoglobin: 7 g/dL — ABNORMAL LOW (ref 13.0–17.0)
Immature Granulocytes: 1 %
Immature Granulocytes: 1 %
Lymphocytes Relative: 10 %
Lymphocytes Relative: 7 %
Lymphs Abs: 0.6 10*3/uL — ABNORMAL LOW (ref 0.7–4.0)
Lymphs Abs: 0.5 10*3/uL — ABNORMAL LOW (ref 0.7–4.0)
MCH: 32.8 pg (ref 26.0–34.0)
MCH: 32.7 pg (ref 26.0–34.0)
MCHC: 32.6 g/dL (ref 30.0–36.0)
MCHC: 33.3 g/dL (ref 30.0–36.0)
MCV: 100.6 fL — ABNORMAL HIGH (ref 80.0–100.0)
MCV: 98.1 fL (ref 80.0–100.0)
Monocytes Absolute: 0.9 10*3/uL (ref 0.1–1.0)
Monocytes Absolute: 0.9 10*3/uL (ref 0.1–1.0)
Monocytes Relative: 15 %
Monocytes Relative: 13 %
Neutro Abs: 4.2 10*3/uL (ref 1.7–7.7)
Neutro Abs: 5.3 10*3/uL (ref 1.7–7.7)
Neutrophils Relative %: 68 %
Neutrophils Relative %: 78 %
Platelets: 87 10*3/uL — ABNORMAL LOW (ref 150–400)
Platelets: 90 10*3/uL — ABNORMAL LOW (ref 150–400)
RBC: 1.8 MIL/uL — ABNORMAL LOW (ref 4.22–5.81)
RBC: 2.14 MIL/uL — ABNORMAL LOW (ref 4.22–5.81)
RDW: 15.1 % (ref 11.5–15.5)
RDW: 15.7 % — ABNORMAL HIGH (ref 11.5–15.5)
WBC: 6 10*3/uL (ref 4.0–10.5)
WBC: 6.8 10*3/uL (ref 4.0–10.5)
nRBC: 0 % (ref 0.0–0.2)
nRBC: 0 % (ref 0.0–0.2)

## 2023-04-23 LAB — BASIC METABOLIC PANEL
Anion gap: 12 (ref 5–15)
BUN: 24 mg/dL — ABNORMAL HIGH (ref 8–23)
CO2: 23 mmol/L (ref 22–32)
Calcium: 8.2 mg/dL — ABNORMAL LOW (ref 8.9–10.3)
Chloride: 98 mmol/L (ref 98–111)
Creatinine, Ser: 1.4 mg/dL — ABNORMAL HIGH (ref 0.61–1.24)
GFR, Estimated: 47 mL/min — ABNORMAL LOW (ref >60)
Glucose, Bld: 128 mg/dL — ABNORMAL HIGH (ref 70–99)
Potassium: 4.2 mmol/L (ref 3.5–5.1)
Sodium: 133 mmol/L — ABNORMAL LOW (ref 135–145)

## 2023-04-23 LAB — TYPE AND SCREEN
Unit division: 0
Unit division: 0

## 2023-04-23 LAB — GLUCOSE, CAPILLARY
Glucose-Capillary: 132 mg/dL — ABNORMAL HIGH (ref 70–99)
Glucose-Capillary: 149 mg/dL — ABNORMAL HIGH (ref 70–99)
Glucose-Capillary: 143 mg/dL — ABNORMAL HIGH (ref 70–99)
Glucose-Capillary: 132 mg/dL — ABNORMAL HIGH (ref 70–99)

## 2023-04-23 LAB — HEMOGLOBIN AND HEMATOCRIT, BLOOD
HCT: 20.8 % — ABNORMAL LOW (ref 39.0–52.0)
HCT: 22.3 % — ABNORMAL LOW (ref 39.0–52.0)
Hemoglobin: 6.8 g/dL — CL (ref 13.0–17.0)
Hemoglobin: 7.5 g/dL — ABNORMAL LOW (ref 13.0–17.0)

## 2023-04-23 LAB — BPAM RBC
Blood Product Expiration Date: 202407162359
Unit Type and Rh: 6200

## 2023-04-23 LAB — PREPARE RBC (CROSSMATCH)

## 2023-04-23 MED ORDER — SODIUM CHLORIDE 0.9% IV SOLUTION: Freq: Once | INTRAVENOUS | Status: AC

## 2023-04-23 MED ORDER — ALBUMIN HUMAN 5 % IV SOLN
25.0000 g | Freq: Once | INTRAVENOUS | Status: AC
  Administered 2023-04-23 (×2): 12.5 g via INTRAVENOUS
  Filled 2023-04-23: qty 500

## 2023-04-23 MED ORDER — ALBUMIN HUMAN 5 % IV SOLN: 25.0000 g | Freq: Once | INTRAVENOUS | Status: DC

## 2023-04-23 NOTE — Progress Notes (Signed)
    Patient Name: MIR AMBER           DOB: [birth year]  MRN: [id number]      Admission Date: 04/22/2023  Attending Provider: Cathleen Corti, MD  Primary Diagnosis: Femur fracture, right [facility])   Level of care: Med-Surg    CROSS COVER NOTE   Date of Service   04/23/2023   Berton Mount, 89 y.o. male, was admitted on 04/22/2023 for Femur fracture, right (HCC).    HPI/Events of Note   Soft BP, 80/50's with MAP>60.  Asymptomatic. All other vital signs WNL.  No acute changes.   Patient has not recently received sedating medication, opioids, antihypertensives. No acute blood loss reported.   Had ORIF procedure on 6/29 with estimated blood loss ~250 cc. Required IV albumin in PACU.    0315-  Hemoglobin 10--> 7 --> 5.9 . Will transfuse pRBC    Interventions/ Plan   Given history of chronic HFrEF(EF 45%), cautious with fluid.  Fluid bolus, 250 cc IV albumin H&H recheck Bld transfusion, 1 unit PRBC         Anthoney Harada, DNP, ACNPC- AG [facility]

## 2023-04-23 NOTE — Progress Notes (Signed)
PROGRESS NOTE   Danny Brady  ZOX:[id number] DOB: 1926-11-12 DOA: 04/22/2023 PCP: Danella Penton, MD   Date of Service: the patient was seen and examined on 04/23/2023  Brief Narrative:   Danny Brady is a 89 y.o. male with medical history significant for hypertension, hyperlipidemia, HFrEF, CVA, paroxysmal A-fib not anticoagulated who presented after a mechanical fall.  Uses a walker at baseline.  He fell after a turn with his walker.  He was in his usual state of health prior to this.   In the ED he was found to have periprosthetic distal right femur fracture.  [name] discussed the case with orthopedic surgery Dr. Gaynell Face regarding who recommended admission by the hospitalist service to [facility] for possible ORIF on 04/22/2023.  The patient was made NPO.  The patient was accepted by Dr. Antionette Char and transferred to [facility] MedSurg unit as inpatient status.\  Assessment and Plan:  Periprosthetic distal right femur fracture, post mechanical fall. POD#1 ORIF -Tylenol 650mg  Q6PRN, oxycodone 2.5mg -5mg  Q4PRN and 5mg  q6PRN -PT OT -CBC BMP a.m.   Chronic normocytic anemia Hg 5.9 this morning, baseline 7-11. Likely due to postoperative blood loss. -Type and screen -Receiving 1 unit RBC this morning -Follow-up H&H -Scds for dvt ppx given need for transfusion     Hyperglycemia CBG 149 -Follow-up A1c   Paroxysmal A-fib  Rate is controlled. Not on anticoagulation -Monitor on telemetry   Chronic HFrEF -Strict I's and O's and daily weight   Chronic anxiety/depression -Continue Zoloft   GERD -Continue PPI  Subjective:  Doing well post operatively. No concerns. Understands he might need another transfusion.  Physical Exam:  Vitals:   04/23/23 0445 04/23/23 0538 04/23/23 0612 04/23/23 0700  BP: (!) 85/65 (!) 99/54 (!) 93/56 112/77  Pulse: 69 73 67 75  Resp: 14 17 18 18   Temp: 97.8 F (36.6 C)   98.2 F (36.8 C)  TempSrc: Oral   Oral  SpO2: 100%   98%   Weight:      Height:       General: Alert, no acute distress, frail appearing, nasal cannula Cardio: Normal S1 and S2, RRR, no r/m/g Pulm: CTAB, normal work of breathing Extremities: No peripheral edema. Right femur dressing dry and clean. Neuro: Cranial nerves grossly intact   Data Reviewed:  I have personally reviewed and interpreted labs, imaging.  CBC: Recent Labs  Lab 04/21/23 2214 04/22/23 0658 04/22/23 1647 04/23/23 0204 04/23/23 0804  WBC 9.4 9.0 8.5 6.0  --   NEUTROABS 7.9*  --   --  4.2  --   HGB 9.4* 10.0* 7.0* 5.9* 6.8*  HCT 28.5* 30.5* 22.0* 18.1* 20.8*  MCV 98.6 100.0 100.5* 100.6*  --   PLT 136* 127* 103* 87*  --     Basic Metabolic Panel: Recent Labs  Lab 04/21/23 2214 04/22/23 0658 04/23/23 0204  NA 135 134* 133*  K 4.4 4.3 4.2  CL 100 97* 98  CO2 26 24 23   GLUCOSE 166* 133* 128*  BUN 21 21 24*  CREATININE 1.04 1.22 1.40*  CALCIUM 8.8* 8.8* 8.2*  MG  --  2.2  --   PHOS  --  4.7*  --     GFR: Estimated Creatinine Clearance: 38.4 mL/min (A) (by C-G formula based on SCr of 1.4 mg/dL (H)). Liver Function Tests: Recent Labs  Lab 04/21/23 2214  AST 19  ALT 12  ALKPHOS 54  BILITOT 1.1  PROT 5.9*  ALBUMIN 3.2*  Coagulation Profile: No results for input(s): "INR", "PROTIME" in the last 168 hours.  Code Status:  Full code.  Code status decision has been confirmed with: patient  Family Communication:  family updated   Severity of Illness:  The appropriate patient status for this patient is INPATIENT. Inpatient status is judged to be reasonable and necessary in order to provide the required intensity of service to ensure the patient's safety. The patient's presenting symptoms, physical exam findings, and initial radiographic and laboratory data in the context of their chronic comorbidities is felt to place them at high risk for further clinical deterioration. Furthermore, it is not anticipated that the patient will be medically stable  for discharge from the hospital within 2 midnights of admission.   * I certify that at the point of admission it is my clinical judgment that the patient will require inpatient hospital care spanning beyond 2 midnights from the point of admission due to high intensity of service, high risk for further deterioration and high frequency of surveillance required.*   Author:  Rolm Gala MD  04/23/2023 9:33 AM

## 2023-04-23 NOTE — Evaluation (Signed)
Physical Therapy Evaluation Patient Details Name: Danny Brady MRN: [id number] DOB: Jan 09, 1927 Today's Date: 04/23/2023  History of Present Illness  Danny Brady is a 89 y.o. male who presented after a mechanical fall resulting in a R periprosthetic distal femur fx; s/p ORIF, WBAT; with medical history significant for hypertension, hyperlipidemia, HFrEF, CVA, paroxysmal A-fib not anticoagulated  Clinical Impression   Pt admitted with above diagnosis. Lives at home alone (but with 2 full-time caregivers, Danny Brady and Danny Brady), in a single-level home with a ramped entrance; Prior to admission, pt was able to walk household distances with RW and close guard assist, had assist for showering; Presents to PT with Pain, Decr AROM and strength RLE, increased functional dependencies compared to baseline;  Needs +2 Max assist for bed mobility and transfers; I'm hopeful that gettign the first OOB transfer done will make the unknown more know to Danny Brady, and that he will make solid progress with functional mobility and ADLs to allow for safe DC home; Patient will benefit from intensive inpatient follow up therapy, >3 hours/day, to maximize independence and safety with mobility and ADLs; Wondering if being a nonogenarian will bolster pt's case for medical complexity; will ask REhab [facility] to weigh in; Pt currently with functional limitations due to the deficits listed below (see PT Problem List). Pt will benefit from skilled PT to increase their independence and safety with mobility to allow discharge to the venue listed below.          Recommendations for follow up therapy are one component of a multi-disciplinary discharge planning process, led by the attending physician.  Recommendations may be updated based on patient status, additional functional criteria and insurance authorization.  Follow Up Recommendations       Assistance Recommended at Discharge Frequent or constant Supervision/Assistance  Patient  can return home with the following  A lot of help with walking and/or transfers;Two people to help with walking and/or transfers;Two people to help with bathing/dressing/bathroom;Assistance with cooking/housework;Assist for transportation;Help with stairs or ramp for entrance    Equipment Recommendations Hospital bed;Wheelchair cushion (measurements PT);Other (comment) (may consider hoyer lift and ambulance transport home, if pt/family opt for no SNF)  Recommendations for Other Services  OT consult;Rehab consult    Functional Status Assessment Patient has had a recent decline in their functional status and demonstrates the ability to make significant improvements in function in a reasonable and predictable amount of time.     Precautions / Restrictions Precautions Precautions: Fall Precaution Comments: Very fragile skin Required Braces or Orthoses: Other Brace Other Brace: Has custom shoes for foot deformities and leg length discrepancy Restrictions Weight Bearing Restrictions: No RLE Weight Bearing: Weight bearing as tolerated      Mobility  Bed Mobility Overal bed mobility: Needs Assistance Bed Mobility: Supine to Sit     Supine to sit: +2 for physical assistance, Max assist     General bed mobility comments: Heavy assist to suport RLE coming off of teh bed and use of bed pad to square off hips    Transfers Overall transfer level: Needs assistance Equipment used: 1 person hand held assist, 2 person hand held assist (L knee blocked for stability) Transfers: Bed to chair/wheelchair/BSC       Squat pivot transfers: +2 physical assistance, +2 safety/equipment, Max assist     General transfer comment: Performed squat pivot transfer to drop-arm recliner on pt's L side; 2 person assist and heavily blocking L knee for stability; L knee buckle during transfer;  limited weight acceptance RLE for this first transfer    Ambulation/Gait                  Stairs             Wheelchair Mobility    Modified Rankin (Stroke Patients Only)       Balance Overall balance assessment: Needs assistance   Sitting balance-Leahy Scale: Fair       Standing balance-Leahy Scale: Zero                               Pertinent Vitals/Pain Pain Assessment Pain Assessment: Faces Faces Pain Scale: Hurts even more Pain Location: RLE, espcially close to knee Pain Descriptors / Indicators: Grimacing, Guarding Pain Intervention(s): Monitored during session, Repositioned    Home Living Family/patient expects to be discharged to:: Private residence Living Arrangements: Alone;Other (Comment) (with personal care attendants) Available Help at Discharge: Family;Personal care attendant;Available 24 hours/day (PCAs: Danny Brady and Danny Brady) Type of Home: House Home Access: Ramped entrance       Home Layout: One level Home Equipment: Rolling Walker (2 wheels);BSC/3in1;Grab bars - toilet;Grab bars - tub/shower;Wheelchair - manual      Prior Function Prior Level of Function : Needs assist             Mobility Comments: walks with RW and standby assist ADLs Comments: Assist in the shower     Hand Dominance        Extremity/Trunk Assessment   Upper Extremity Assessment Upper Extremity Assessment: Defer to OT evaluation;Generalized weakness;LUE deficits/detail LUE Deficits / Details: Son reports weakness and decr cooordination L side since CVA    Lower Extremity Assessment Lower Extremity Assessment: Generalized weakness;RLE deficits/detail;LLE deficits/detail RLE Deficits / Details: RLE considerably shorter than LLE, and pt has custom shoes for this; Ace wrapped toes to hip; active movement at ankle, knee, and hip, though limited pain free range, and difficulty moving R hip into abduction inthe bed; Grossly decr AROM and strength, limited by pain post fall, fracture, and surgical fixation RLE: Unable to fully assess due to pain LLE Deficits /  Details: Son reports LLE with weakness and decr coordination LLE after CVA       Communication   Communication: HOH  Cognition Arousal/Alertness: Awake/alert Behavior During Therapy: WFL for tasks assessed/performed Overall Cognitive Status: Within Functional Limits for tasks assessed                                 General Comments: Pleasant; tending to joke; nervous about getting up        General Comments General comments (skin integrity, edema, etc.): Sesson conducted on supplemental O2, and O2sats remained mid to upper 90s;   04/23/23 1700 04/23/23 1701  Vital Signs  Patient Position (if appropriate) Orthostatic Vitals  --   Orthostatic Lying   BP- Lying 111/66  --   Pulse- Lying 91  --   Orthostatic Sitting  BP- Sitting 106/67 100/63  Pulse- Sitting 92 93 (in recliner post squat pivot transfer)       Exercises     Assessment/Plan    PT Assessment Patient needs continued PT services  PT Problem List Decreased strength;Decreased range of motion;Decreased activity tolerance;Decreased balance;Decreased mobility;Decreased coordination;Decreased knowledge of use of DME;Decreased safety awareness;Cardiopulmonary status limiting activity;Decreased knowledge of precautions;Pain;Decreased skin integrity       PT Treatment Interventions  DME instruction;Gait training;Functional mobility training;Therapeutic activities;Therapeutic exercise;Balance training;Neuromuscular re-education;Cognitive remediation;Patient/family education;Wheelchair mobility training    PT Goals (Current goals can be found in the Care Plan section)  Acute Rehab PT Goals Patient Stated Goal: agrees to OOB; doesn't want to hurt; doesn't want to go to SNF PT Goal Formulation: With patient/family Time For Goal Achievement: 05/07/23 Potential to Achieve Goals: Good    Frequency Min 5X/week     Co-evaluation               AM-PAC PT "6 Clicks" Mobility  Outcome Measure Help needed  turning from your back to your side while in a flat bed without using bedrails?: A Lot Help needed moving from lying on your back to sitting on the side of a flat bed without using bedrails?: A Lot Help needed moving to and from a bed to a chair (including a wheelchair)?: Total Help needed standing up from a chair using your arms (e.g., wheelchair or bedside chair)?: Total Help needed to walk in hospital room?: Total Help needed climbing 3-5 steps with a railing? : Total 6 Click Score: 8    End of Session Equipment Utilized During Treatment: Gait belt Activity Tolerance: Patient tolerated treatment well Patient left: in chair;with call bell/phone within reach;with chair alarm set Nurse Communication: Mobility status PT Visit Diagnosis: Unsteadiness on feet (R26.81);Other abnormalities of gait and mobility (R26.89);History of falling (Z91.81);Pain;Muscle weakness (generalized) (M62.81) Pain - Right/Left: Right Pain - part of body: Leg    Time: 4098-1191 PT Time Calculation (min) (ACUTE ONLY): 51 min   Charges:   PT Evaluation $PT Eval Moderate Complexity: 1 Mod PT Treatments $Therapeutic Activity: 23-37 mins        Van Clines, PT  Acute Rehabilitation Services Office [phone] Secure Chat welcomed   [name] 04/23/2023, 5:26 PM

## 2023-04-23 NOTE — Progress Notes (Signed)
   04/23/23 0248 04/23/23 0300  Assess: MEWS Score  Temp 97.8 F (36.6 C)  --   BP (!) 82/63  --   MAP (mmHg) 71  --   Pulse Rate 72  --   Resp 13  --   Level of Consciousness Alert  --   SpO2 99 %  --   O2 Device Nasal Cannula  --   O2 Flow Rate (L/min) 2 L/min  --   Assess: if the MEWS score is Yellow or Red  Were vital signs taken at a resting state? Yes  --   Focused Assessment No change from prior assessment  --   Does the patient meet 2 or more of the SIRS criteria? No  --   MEWS guidelines implemented  Yes, yellow  --   Treat  MEWS Interventions Considered administering scheduled or prn medications/treatments as ordered  --   Take Vital Signs  Increase Vital Sign Frequency  Yellow: Q2hr x1, continue Q4hrs until patient remains green for 12hrs  --   Escalate  MEWS: Escalate Yellow: Discuss with charge nurse and consider notifying provider and/or RRT  --   Provider Notification  Provider Name/Title  --  Anthoney Harada, NP  Date Provider Notified  --  04/23/23  Time Provider Notified  --  0300  Method of Notification  --  Page  Notification Reason  --  Critical Result  Provider response  --  See new orders (1 unit prbc)  Date of Provider Response  --  04/23/23  Time of Provider Response  --  5621   Notified Anthoney Harada, NP for low BP @ midnight. 250 ml NS bolus ordered, Albumin and stat h&H. Critical lab result of Hgb of 5.9. Pt asymptomatic for low BP's. 1 Unit PRBC ordered. I unit administered. BP WNL after unit of blood. Post transfusion H&H ordered.

## 2023-04-23 NOTE — Progress Notes (Signed)
    Subjective: Patient reports pain as mild.  Tolerating diet.  Urinating.   No CP, SOB.  Has just mobilized OOB with PT.  Low Hgb requiring blood transfusions today.  Objective:   VITALS:   Vitals:   04/23/23 0445 04/23/23 0538 04/23/23 0612 04/23/23 0700  BP: (!) 85/65 (!) 99/54 (!) 93/56 112/77  Pulse: 69 73 67 75  Resp: 14 17 18 18   Temp: 97.8 F (36.6 C)   98.2 F (36.8 C)  TempSrc: Oral   Oral  SpO2: 100%   98%  Weight:      Height:          Latest Ref Rng & Units 04/23/2023   11:34 AM 04/23/2023    8:04 AM 04/23/2023    2:04 AM  CBC  WBC 4.0 - 10.5 K/uL 6.8   6.0   Hemoglobin 13.0 - 17.0 g/dL 7.0  6.8  5.9   Hematocrit 39.0 - 52.0 % 21.0  20.8  18.1   Platelets 150 - 400 K/uL 90   87       Latest Ref Rng & Units 04/23/2023    2:04 AM 04/22/2023    6:58 AM 04/21/2023   10:14 PM  BMP  Glucose 70 - 99 mg/dL 213  086  578   BUN 8 - 23 mg/dL 24  21  21    Creatinine 0.61 - 1.24 mg/dL 4.69  6.29  5.28   Sodium 135 - 145 mmol/L 133  134  135   Potassium 3.5 - 5.1 mmol/L 4.2  4.3  4.4   Chloride 98 - 111 mmol/L 98  97  100   CO2 22 - 32 mmol/L 23  24  26    Calcium 8.9 - 10.3 mg/dL 8.2  8.8  8.8    Intake/Output      06/29 0701 06/30 0700 06/30 0701 07/01 0700   P.O. 320    I.V. (mL/kg) 1550 (16.5)    Blood 337.4    IV Piggyback 741.3    Total Intake(mL/kg) 2948.8 (31.3)    Urine (mL/kg/hr) 860 (0.4)    Blood 250    Total Output 1110    Net +1838.8            Physical Exam: General: NAD.  Sititng up in bedside chair, calm, comfortable Resp: No increased wob Cardio: regular rate and rhythm ABD soft Neurologically intact MSK Neurovascularly intact Sensation intact distally Intact pulses distally Dorsiflexion/Plantar flexion intact Incision: dressing C/D/I   Assessment: 1 Day Post-Op  S/P Procedure(s) (LRB): OPEN REDUCTION INTERNAL FIXATION (ORIF) PERIPROSTHETIC FRACTURE (Right) by Dr. Blanchie Dessert on 04/22/23  Principal Problem:   Femur  fracture, right (HCC)   Plan:  Advance diet Up with therapy Incentive Spirometry Elevate and Apply ice  Weightbearing: WBAT RLE Insicional and dressing care: Dressings left intact until follow-up and Reinforce dressings as needed Orthopedic device(s): None Showering: Keep dressing dry VTE prophylaxis: Lovenox 40mg  qd which will need to be held until Hgb normalized , SCDs, ambulation Pain control: PRN Contact information:  Margarita Rana MD, Levester Fresh PA-C  Dispo:  TBD based on PT evaluations     Marzetta Board Office [phone] 04/23/2023, 2:08 PM

## 2023-04-23 NOTE — Plan of Care (Signed)

## 2023-04-24 DIAGNOSIS — D62 Acute posthemorrhagic anemia: Secondary | ICD-10-CM | POA: Diagnosis not present

## 2023-04-24 DIAGNOSIS — I5020 Unspecified systolic (congestive) heart failure: Secondary | ICD-10-CM

## 2023-04-24 DIAGNOSIS — N179 Acute kidney failure, unspecified: Secondary | ICD-10-CM

## 2023-04-24 DIAGNOSIS — M259 Joint disorder, unspecified: Secondary | ICD-10-CM

## 2023-04-24 DIAGNOSIS — I480 Paroxysmal atrial fibrillation: Secondary | ICD-10-CM

## 2023-04-24 DIAGNOSIS — S7291XA Unspecified fracture of right femur, initial encounter for closed fracture: Secondary | ICD-10-CM | POA: Diagnosis not present

## 2023-04-24 DIAGNOSIS — F411 Generalized anxiety disorder: Secondary | ICD-10-CM

## 2023-04-24 LAB — CBC WITH DIFFERENTIAL/PLATELET
Abs Immature Granulocytes: 0.07 10*3/uL (ref 0.00–0.07)
Basophils Absolute: 0 10*3/uL (ref 0.0–0.1)
Basophils Relative: 0 %
Eosinophils Absolute: 0.2 10*3/uL (ref 0.0–0.5)
Eosinophils Relative: 2 %
HCT: 24.2 % — ABNORMAL LOW (ref 39.0–52.0)
Hemoglobin: 8.2 g/dL — ABNORMAL LOW (ref 13.0–17.0)
Immature Granulocytes: 1 %
Lymphocytes Relative: 9 %
Lymphs Abs: 0.6 10*3/uL — ABNORMAL LOW (ref 0.7–4.0)
MCH: 32.9 pg (ref 26.0–34.0)
MCHC: 33.9 g/dL (ref 30.0–36.0)
MCV: 97.2 fL (ref 80.0–100.0)
Monocytes Absolute: 0.9 10*3/uL (ref 0.1–1.0)
Monocytes Relative: 14 %
Neutro Abs: 4.7 10*3/uL (ref 1.7–7.7)
Neutrophils Relative %: 74 %
Platelets: 98 10*3/uL — ABNORMAL LOW (ref 150–400)
RBC: 2.49 MIL/uL — ABNORMAL LOW (ref 4.22–5.81)
RDW: 15.9 % — ABNORMAL HIGH (ref 11.5–15.5)
WBC: 6.4 10*3/uL (ref 4.0–10.5)
nRBC: 0 % (ref 0.0–0.2)

## 2023-04-24 LAB — GLUCOSE, CAPILLARY
Glucose-Capillary: 126 mg/dL — ABNORMAL HIGH (ref 70–99)
Glucose-Capillary: 97 mg/dL (ref 70–99)
Glucose-Capillary: 133 mg/dL — ABNORMAL HIGH (ref 70–99)
Glucose-Capillary: 137 mg/dL — ABNORMAL HIGH (ref 70–99)

## 2023-04-24 LAB — BPAM RBC
Blood Product Expiration Date: 202407152359
ISSUE DATE / TIME: 202406300416
ISSUE DATE / TIME: 202406301529
Unit Type and Rh: 6200

## 2023-04-24 LAB — BASIC METABOLIC PANEL
Anion gap: 9 (ref 5–15)
BUN: 24 mg/dL — ABNORMAL HIGH (ref 8–23)
CO2: 26 mmol/L (ref 22–32)
Calcium: 8.2 mg/dL — ABNORMAL LOW (ref 8.9–10.3)
Chloride: 97 mmol/L — ABNORMAL LOW (ref 98–111)
Creatinine, Ser: 1.43 mg/dL — ABNORMAL HIGH (ref 0.61–1.24)
GFR, Estimated: 45 mL/min — ABNORMAL LOW (ref >60)
Glucose, Bld: 128 mg/dL — ABNORMAL HIGH (ref 70–99)
Potassium: 4 mmol/L (ref 3.5–5.1)
Sodium: 132 mmol/L — ABNORMAL LOW (ref 135–145)

## 2023-04-24 LAB — TYPE AND SCREEN
ABO/RH(D): A POS
Antibody Screen: NEGATIVE

## 2023-04-24 LAB — HEMOGLOBIN A1C
Hgb A1c MFr Bld: 5.6 % (ref 4.8–5.6)
Hgb A1c MFr Bld: 5.5 % (ref 4.8–5.6)
Mean Plasma Glucose: 114 mg/dL
Mean Plasma Glucose: 111 mg/dL

## 2023-04-24 MED ORDER — FINASTERIDE 5 MG PO TABS
5.0000 mg | ORAL_TABLET | Freq: Every evening | ORAL | Status: DC
  Administered 2023-04-24 – 2023-05-10 (×17): 5 mg via ORAL
  Filled 2023-04-24 (×17): qty 1

## 2023-04-24 MED ORDER — MUPIROCIN 2 % EX OINT
TOPICAL_OINTMENT | Freq: Two times a day (BID) | CUTANEOUS | Status: AC
  Administered 2023-04-24: 1 via NASAL
  Filled 2023-04-24: qty 22

## 2023-04-24 NOTE — Progress Notes (Signed)
Physical Therapy Treatment Patient Details Name: Danny Brady MRN: [id number] DOB: 1927/07/05 Today's Date: 04/24/2023   History of Present Illness Danny Brady is a 89 y.o. male who presented after a mechanical fall resulting in a R periprosthetic distal femur fx; s/p ORIF, WBAT; with medical history significant for hypertension, hyperlipidemia, HFrEF, CVA, paroxysmal A-fib not anticoagulated    PT Comments  Continuing work on functional mobility and activity tolerance;  session focused on functional transfers, using a different method, lateral scooting, for bed to chair transfer; able to transfer bed to drop-arm recliner on pt's L side with +2 Max assist; notably more able to assist with positioning and bed mobility, and the transfer; Also noted pt able to initiate R knee extension against gravity; Showing better activity tolerance; Patient will benefit from intensive inpatient follow up therapy, >3 hours/day     Assistance Recommended at Discharge Frequent or constant Supervision/Assistance  If plan is discharge home, recommend the following:  Can travel by private vehicle    A lot of help with walking and/or transfers;Two people to help with walking and/or transfers;Two people to help with bathing/dressing/bathroom;Assistance with cooking/housework;Assist for transportation;Help with stairs or ramp for entrance      Equipment Recommendations  Hospital bed;Wheelchair cushion (measurements PT);Other (comment) (may consider hoyer lift and ambulance transport home, if pt/family opt for no SNF)    Recommendations for Other Services       Precautions / Restrictions Precautions Precautions: Fall Precaution Comments: Very fragile skin Required Braces or Orthoses: Other Brace Other Brace: Has custom shoes for foot deformities and leg length discrepancy Restrictions Weight Bearing Restrictions: Yes RLE Weight Bearing: Weight bearing as tolerated     Mobility  Bed Mobility Overal  bed mobility: Needs Assistance Bed Mobility: Supine to Sit     Supine to sit: +2 for physical assistance, Max assist     General bed mobility comments: Better able to participate today; movign RLE towards EOB with AA hip abduction; adducts LLE towards EOB without physical assist; good initiation of reaching across body for bedrail wit LUE, hand-over-hand assist and second person suporting trunk coming up for pt to be able to grasp bedrail; Max assist and use of bed pad to elevate trunk to fully sitting and square off hips    Transfers Overall transfer level: Needs assistance Equipment used: 1 person hand held assist, 2 person hand held assist (bed pad) Transfers: Bed to chair/wheelchair/BSC            Lateral/Scoot Transfers: +2 physical assistance, +2 safety/equipment, Mod assist, Max assist General transfer comment: Showing more participation to initiate weight shifting, help with foot placement in prep for lateral scoot    Ambulation/Gait                   Stairs             Wheelchair Mobility     Tilt Bed    Modified Rankin (Stroke Patients Only)       Balance Overall balance assessment: Needs assistance   Sitting balance-Leahy Scale: Fair       Standing balance-Leahy Scale: Zero                              Cognition Arousal/Alertness: Awake/alert Behavior During Therapy: WFL for tasks assessed/performed Overall Cognitive Status: Within Functional Limits for tasks assessed  General Comments: Pleasant; tending to joke; nervous about getting up        Exercises      General Comments General comments (skin integrity, edema, etc.): Session conducted on room air, and O2 sats reamined greater than or equal to 90%      Pertinent Vitals/Pain Pain Assessment Pain Assessment: Faces Faces Pain Scale: Hurts little more Pain Location: RLE, espcially close to knee Pain Descriptors /  Indicators: Grimacing, Guarding Pain Intervention(s): Monitored during session    Home Living Family/patient expects to be discharged to:: Private residence Living Arrangements: Alone;Other (Comment) (has rotating PCA s wtih 24/7 care) Available Help at Discharge: Family;Personal care attendant;Available 24 hours/day (PCAs: [name] and [name]) Type of Home: House Home Access: Ramped entrance       Home Layout: One level Home Equipment: Rolling Walker (2 wheels);BSC/3in1;Grab bars - toilet;Grab bars - tub/shower;Wheelchair - manual      Prior Function            PT Goals (current goals can now be found in the care plan section) Acute Rehab PT Goals Patient Stated Goal: agrees to OOB; doesn't want to hurt; doesn't want to go to SNF PT Goal Formulation: With patient/family Time For Goal Achievement: 05/07/23 Potential to Achieve Goals: Good Progress towards PT goals: Progressing toward goals    Frequency    Min 5X/week      PT Plan Current plan remains appropriate    Co-evaluation              AM-PAC PT "6 Clicks" Mobility   Outcome Measure  Help needed turning from your back to your side while in a flat bed without using bedrails?: A Lot Help needed moving from lying on your back to sitting on the side of a flat bed without using bedrails?: A Lot Help needed moving to and from a bed to a chair (including a wheelchair)?: Total Help needed standing up from a chair using your arms (e.g., wheelchair or bedside chair)?: Total Help needed to walk in hospital room?: Total Help needed climbing 3-5 steps with a railing? : Total 6 Click Score: 8    End of Session Equipment Utilized During Treatment: Gait belt Activity Tolerance: Patient tolerated treatment well Patient left: in chair;with call bell/phone within reach;with chair alarm set Nurse Communication: Mobility status PT Visit Diagnosis: Unsteadiness on feet (R26.81);Other abnormalities of gait and mobility  (R26.89);History of falling (Z91.81);Pain;Muscle weakness (generalized) (M62.81) Pain - Right/Left: Right Pain - part of body: Leg     Time: 1134-1206 PT Time Calculation (min) (ACUTE ONLY): 32 min  Charges:    $Therapeutic Activity: 23-37 mins PT General Charges $$ ACUTE PT VISIT: 1 Visit                     Van Clines, PT  Acute Rehabilitation Services Office [phone] Secure Chat welcomed    [name] 04/24/2023, 5:42 PM

## 2023-04-24 NOTE — Evaluation (Signed)
Occupational Therapy Evaluation Patient Details Name: Danny Brady MRN: [id number] DOB: [birth year] Today's Date: 04/24/2023   History of Present Illness Danny Brady is a 89 y.o. male who presented after a mechanical fall resulting in a R periprosthetic distal femur fx; s/p ORIF, WBAT; with medical history significant for hypertension, hyperlipidemia, HFrEF, CVA, paroxysmal A-fib not anticoagulated   Clinical Impression   Pt admitted for above dx, PTA patient had assist with lower body bADLs but has a hx of falls due to losing balance, he lives with spouse and 2 PCAs. Pt currently limited by pain and decreased activity tolerance. He needs Mod A +2 for safe mobility and is unable to gather balance to produce any steps. Pt fatigues quickly with functional activity and needed supplemental 02 to return Sp02 stats to normal values despite pursed lip breathing. Pt would benefit from continued acute skilled OT services to address deficits and help transition to next level of care. Patient has the potential to reach Mod I and demos the ability to tolerate 3 hours of therapy. Pt would benefit from an intensive rehab program to help maximize functional independence.    Recommendations for follow up therapy are one component of a multi-disciplinary discharge planning process, led by the attending physician.  Recommendations may be updated based on patient status, additional functional criteria and insurance authorization.   Assistance Recommended at Discharge Frequent or constant Supervision/Assistance  Patient can return home with the following Two people to help with walking and/or transfers;A lot of help with bathing/dressing/bathroom;Assistance with cooking/housework;Assist for transportation;Help with stairs or ramp for entrance;Direct supervision/assist for financial management;Direct supervision/assist for medications management    Functional Status Assessment  Patient has had a recent decline in  their functional status and demonstrates the ability to make significant improvements in function in a reasonable and predictable amount of time.  Equipment Recommendations  None recommended by OT (TBD at next level of care)    Recommendations for Other Services       Precautions / Restrictions Precautions Precautions: Fall Precaution Comments: Very fragile skin Required Braces or Orthoses: Other Brace Other Brace: Has custom shoes for foot deformities and leg length discrepancy Restrictions Weight Bearing Restrictions: Yes RLE Weight Bearing: Weight bearing as tolerated      Mobility Bed Mobility Overal bed mobility: Needs Assistance Bed Mobility: Sit to Supine       Sit to supine: Max assist, +2 for physical assistance, +2 for safety/equipment   General bed mobility comments: Pt very fatigued at end of session, needing assist with trunk and BLE control to return to supine    Transfers Overall transfer level: Needs assistance Equipment used: Rolling walker (2 wheels) Transfers: Sit to/from Stand, Bed to chair/wheelchair/BSC Sit to Stand: Mod assist, +2 physical assistance Stand pivot transfers: Mod assist         General transfer comment: Cues to position heels back and shift weight anteriorly, Pt unable to gather balance after prolonged standing. Pt needign Mod A to pivot back to bed with similar cues to complete STS      Balance Overall balance assessment: Needs assistance Sitting-balance support: Feet supported Sitting balance-Leahy Scale: Fair       Standing balance-Leahy Scale: Zero                             ADL either performed or assessed with clinical judgement   ADL Overall ADL's : Needs assistance/impaired Eating/Feeding: Independent;Sitting  Grooming: Sitting;Min guard   Upper Body Bathing: Set up;Supervision/ safety;Sitting   Lower Body Bathing: Maximal assistance;Sitting/lateral leans   Upper Body Dressing : Set  up;Supervision/safety;Sitting   Lower Body Dressing: Maximal assistance;Sitting/lateral leans   Toilet Transfer: Moderate assistance;Stand-pivot;BSC/3in1   Toileting- Clothing Manipulation and Hygiene: Maximal assistance         General ADL Comments: Pt not able to gather balance to take steps this session     Vision         Perception     Praxis      Pertinent Vitals/Pain Pain Assessment Pain Assessment: Faces Faces Pain Scale: Hurts little more Pain Location: RLE, near hips Pain Descriptors / Indicators: Grimacing, Guarding, Aching Pain Intervention(s): Monitored during session, Repositioned     Hand Dominance     Extremity/Trunk Assessment Upper Extremity Assessment Upper Extremity Assessment: Generalized weakness (steps. Per PCA report he is limted to 90* shoulder flexion) LUE Deficits / Details: Son reports weakness and decr cooordination L side since CVA   Lower Extremity Assessment Lower Extremity Assessment: Generalized weakness RLE Deficits / Details: RLE considerably shorter than LLE, and pt has custom shoes for this; Ace wrapped toes to hip; active movement at ankle, knee, and hip, though limited pain free range, and difficulty moving R hip into abduction inthe bed; Grossly decr AROM and strength, limited by pain post fall, fracture, and surgical fixation RLE: Unable to fully assess due to pain LLE Deficits / Details: Son reports LLE with weakness and decr coordination LLE after CVA       Communication Communication Communication: HOH   Cognition Arousal/Alertness: Awake/alert Behavior During Therapy: WFL for tasks assessed/performed Overall Cognitive Status: Within Functional Limits for tasks assessed                                       General Comments  Pt noted to drop in Sp02 during functional activity to 90% on RA with increased RR noted, briefly put pt on 2L and he returned to 95-97% Sp02.    Exercises     Shoulder  Instructions      Home Living Family/patient expects to be discharged to:: Private residence Living Arrangements: Alone;Other (Comment) (has rotating PCA s wtih 24/7 care) Available Help at Discharge: Family;Personal care attendant;Available 24 hours/day (PCAs: [name] and [name]) Type of Home: House Home Access: Ramped entrance     Home Layout: One level     Bathroom Shower/Tub: Producer, television/film/video: Handicapped height Bathroom Accessibility: Yes   Home Equipment: Agricultural consultant (2 wheels);BSC/3in1;Grab bars - toilet;Grab bars - tub/shower;Wheelchair - manual          Prior Functioning/Environment Prior Level of Function : Needs assist             Mobility Comments: walks with RW and standby assist ADLs Comments: Assist with lower body bADLs and in the shower        OT Problem List: Decreased activity tolerance;Impaired balance (sitting and/or standing);Decreased strength      OT Treatment/Interventions: Self-care/ADL training;Therapeutic activities;Therapeutic exercise;DME and/or AE instruction;Patient/family education;Balance training    OT Goals(Current goals can be found in the care plan section) Acute Rehab OT Goals Patient Stated Goal: To get better OT Goal Formulation: With patient Time For Goal Achievement: 05/08/23 Potential to Achieve Goals: Good ADL Goals Pt Will Perform Grooming: sitting;with set-up;with supervision Pt Will Transfer to Toilet: stand pivot transfer;with  min assist;bedside commode Additional ADL Goal #1: Pt will independently use of breathing strategies to maintain SP02 above 93% during function tasks to demonstrate improved activity tolerance upon Physical exertion  OT Frequency: Min 2X/week    Co-evaluation              AM-PAC OT "6 Clicks" Daily Activity     Outcome Measure Help from another person eating meals?: None Help from another person taking care of personal grooming?: A Little Help from another person  toileting, which includes using toliet, bedpan, or urinal?: A Lot Help from another person bathing (including washing, rinsing, drying)?: A Lot Help from another person to put on and taking off regular upper body clothing?: A Little Help from another person to put on and taking off regular lower body clothing?: A Lot 6 Click Score: 16   End of Session Equipment Utilized During Treatment: Gait belt;Rolling walker (2 wheels) Nurse Communication: Mobility status  Activity Tolerance: Patient tolerated treatment well Patient left: in bed;with call bell/phone within reach;with bed alarm set;with family/visitor present (PCA)  OT Visit Diagnosis: Unsteadiness on feet (R26.81);Other abnormalities of gait and mobility (R26.89);History of falling (Z91.81)                Time: 1610-9604 OT Time Calculation (min): 37 min Charges:  OT General Charges $OT Visit: 1 Visit OT Evaluation $OT Eval Moderate Complexity: 1 Mod OT Treatments $Therapeutic Activity: 23-37 mins  04/24/2023  AB, OTR/L  Acute Rehabilitation Services  Office: [phone]   Tristan Schroeder 04/24/2023, 3:55 PM

## 2023-04-24 NOTE — Plan of Care (Signed)
  Problem: Education: Goal: Knowledge of General Education information will improve Description: Including pain rating scale, medication(s)/side effects and non-pharmacologic comfort measures Outcome: Progressing   Problem: Nutrition: Goal: Adequate nutrition will be maintained Outcome: Progressing   Problem: Activity: Goal: Risk for activity intolerance will decrease Outcome: Progressing   Problem: Coping: Goal: Level of anxiety will decrease Outcome: Progressing   Problem: Safety: Goal: Ability to remain free from injury will improve Outcome: Progressing   Problem: Pain Managment: Goal: General experience of comfort will improve Outcome: Progressing   Problem: Skin Integrity: Goal: Risk for impaired skin integrity will decrease Outcome: Progressing

## 2023-04-24 NOTE — Progress Notes (Signed)
Paged J. Garner Nash, NP for low urine output. Pt Foley d/c'd 6/30 @ 0640. 50 ml void during the day. I & Cath @ 1900 yields . Pt has had minimal to drink. Pt denies discomfort,  pt voids 50 ml overnight and 1 small incontinent episode. Bladder scan shows 110 ml. No IVF infusing.  No I& O cath at this time per J. Garner Nash, NP. Pt encouraged to increase PO intake. Pt states he will try to.

## 2023-04-24 NOTE — Progress Notes (Signed)
Inpatient Rehab Admissions Coordinator:   Consult received.  Will ask rehab MD to weigh in and f/u with patient this afternoon.  Estill Dooms, PT, DPT Admissions Coordinator [phone] 04/24/23  11:54 AM

## 2023-04-24 NOTE — Progress Notes (Signed)
PROGRESS NOTE   Danny Brady  WUJ:[id number] DOB: Oct 25, 1926 DOA: 04/22/2023 PCP: Danella Penton, MD   Date of Service: the patient was seen and examined on 04/24/2023  Brief Narrative:   Danny Brady is a 89 y.o. male with medical history significant for hypertension, hyperlipidemia, HFrEF, CVA, paroxysmal A-fib not anticoagulated who presented after a mechanical fall.  Uses a walker at baseline.  He fell after a turn with his walker.  He was in his usual state of health prior to this.   In the ED he was found to have periprosthetic distal right femur fracture.  [name] discussed the case with orthopedic surgery Dr. Gaynell Face regarding who recommended admission by the hospitalist service to [facility] for possible ORIF on 04/22/2023.  The patient was made NPO.  The patient was accepted by Dr. Antionette Char and transferred to [facility] MedSurg unit as inpatient status  ORIF on 6/29 Plan for CIR vs SNF?   Assessment and Plan:  Periprosthetic distal right femur fracture, post mechanical fall. -s/p ORIF -Tylenol 650mg  Q6PRN, oxycodone 2.5mg -5mg  Q4PRN and 5mg  q6PRN -PT OT-  CIR? -trend labs   Chronic normocytic anemia Hg 5.9 dropped due to postoperative blood loss. -Type and screen-- given 2 units PRBC -daily labs   Hyperglycemia CBG 149 -Follow-up A1c- pending still   Paroxysmal A-fib  Rate is controlled. Not on anticoagulation -Monitor on telemetry   AKI -no urinary retention -trend with PO fluid pushes  Chronic HFrEF -Strict I's and O's and daily weight -holding all diuretics (demadex/zaroxolyn/lasix)   Chronic anxiety/depression -Continue Zoloft   GERD -Continue PPI  Subjective:  No pain-- understands he has to go to rehab prior to returning home  Physical Exam:  Vitals:   04/23/23 2105 04/24/23 0416 04/24/23 0700 04/24/23 0730  BP: (!) 101/56 107/72  117/66  Pulse:  68  71  Resp:  15    Temp:    97.8 F (36.6 C)  TempSrc:    Oral  SpO2:    98%   Weight:   105.2 kg   Height:        General: Appearance:    Obese male in no acute distress     Lungs:      respirations unlabored  Heart:    Normal heart rate.   MS:   All extremities are intact.   Neurologic:   Awake, alert     Data Reviewed:  I have personally reviewed and interpreted labs, imaging.  CBC: Recent Labs  Lab 04/21/23 2214 04/22/23 0658 04/22/23 1647 04/23/23 0204 04/23/23 0804 04/23/23 1134 04/23/23 2020 04/24/23 0757  WBC 9.4 9.0 8.5 6.0  --  6.8  --  6.4  NEUTROABS 7.9*  --   --  4.2  --  5.3  --  4.7  HGB 9.4* 10.0* 7.0* 5.9* 6.8* 7.0* 7.5* 8.2*  HCT 28.5* 30.5* 22.0* 18.1* 20.8* 21.0* 22.3* 24.2*  MCV 98.6 100.0 100.5* 100.6*  --  98.1  --  97.2  PLT 136* 127* 103* 87*  --  90*  --  98*   Basic Metabolic Panel: Recent Labs  Lab 04/21/23 2214 04/22/23 0658 04/23/23 0204 04/24/23 0510  NA 135 134* 133* 132*  K 4.4 4.3 4.2 4.0  CL 100 97* 98 97*  CO2 26 24 23 26   GLUCOSE 166* 133* 128* 128*  BUN 21 21 24* 24*  CREATININE 1.04 1.22 1.40* 1.43*  CALCIUM 8.8* 8.8* 8.2* 8.2*  MG  --  2.2  --   --   PHOS  --  4.7*  --   --    GFR: Estimated Creatinine Clearance: 39.6 mL/min (A) (by C-G formula based on SCr of 1.43 mg/dL (H)). Liver Function Tests: Recent Labs  Lab 04/21/23 2214  AST 19  ALT 12  ALKPHOS 54  BILITOT 1.1  PROT 5.9*  ALBUMIN 3.2*    Coagulation Profile: No results for input(s): "INR", "PROTIME" in the last 168 hours.  Code Status:  Full code.  Code status decision has been confirmed with: patient  Family Communication:  care giver updated at bedside     Author:  Joseph Art DO  04/24/2023 9:45 AM

## 2023-04-24 NOTE — Consult Note (Addendum)
Physical Medicine and Rehabilitation Consult Reason for Consult: Rehab Referring Physician: Dr. Benjamine Mola   HPI: Danny Brady is a 89 y.o. male with past medical history of anemia, GERD, hypertension, anxiety, chronic shoulder pain, hyperlipidemia, HFrEF, paroxysmal A-fib, who presented to the hospital after a fall while turning with his walker.  Patient was found to have a Perry prosthetic distal right femur fracture.  Patient had ORIF for periprosthetic distal femur fracture on 04/22/2023 by Dr. Blanchie Dessert.  Patient had difficulty urinating after the surgery, chronic issue.  DVT prophylaxis with Lovenox in-house plan for DC on aspirin 81 mg twice daily.  He required blood transfusion for acute blood loss anemia.  He reports his pain is controlled overall.  He is being treated for AKI.  He continues on Zoloft for his anxiety/depression.  He reports he has not had a recent bowel movement.  Bladder scans being conducted to monitor bladder function.  Patient uses a walker at baseline.  He lives in a home with ramp entrance.  He has caregivers who assist him 24/7.  His family also lives close by and can assist also.  He was able to complete some ADLs by himself however was limited by chronic shoulder pain with limited range of motion in his shoulders.   Review of Systems  Constitutional:  Negative for chills and fever.  HENT:  Negative for congestion.   Eyes:  Negative for double vision.  Respiratory:  Negative for cough and shortness of breath.   Cardiovascular:  Negative for chest pain.  Gastrointestinal:  Positive for constipation. Negative for abdominal pain.  Genitourinary:  Positive for frequency.  Musculoskeletal:  Positive for falls and joint pain.  Skin:  Negative for rash.  Neurological:  Positive for weakness. Negative for sensory change.  Psychiatric/Behavioral:  Positive for depression. The patient is nervous/anxious.    Past Medical History:  Diagnosis Date   Anemia     Anxiety    Edema    GERD (gastroesophageal reflux disease)    Hypertension    Past Surgical History:  Procedure Laterality Date   BLADDER SURGERY     HERNIA REPAIR     INTRAMEDULLARY (IM) NAIL INTERTROCHANTERIC Right 11/20/2019   Procedure: INTRAMEDULLARY (IM) NAIL INTERTROCHANTRIC;  Surgeon: Lyndle Herrlich, MD;  Location: [facility] ORS;  Service: Orthopedics;  Laterality: Right;   History reviewed. No pertinent family history. Social History:  reports that he has never smoked. He has never used smokeless tobacco. He reports that he does not currently use alcohol. He reports that he does not use drugs. Allergies:  Allergies  Allergen Reactions   Prednisone Other (See Comments)    insomnia   Tamsulosin Other (See Comments)    Unknown insomnia    Medications Prior to Admission  Medication Sig Dispense Refill   acetaminophen (TYLENOL) 500 MG tablet Take 1 tablet (500 mg total) by mouth every 8 (eight) hours as needed. 30 tablet 0   aspirin EC 81 MG EC tablet Take 1 tablet (81 mg total) by mouth daily. 30 tablet 0   finasteride (PROSCAR) 5 MG tablet Take 5 mg by mouth every evening.      furosemide (LASIX) 20 MG tablet Take 1 tablet (20 mg total) by mouth daily. (Patient taking differently: Take 10 mg by mouth daily. Pt states he takes half a tablet) 30 tablet 0   ibuprofen (ADVIL) 400 MG tablet Take 1 tablet (400 mg total) by mouth every 8 (eight) hours as needed for  mild pain or moderate pain. 30 tablet 0   Magnesium 250 MG TABS Take 250 mg by mouth daily.     omeprazole (PRILOSEC) 20 MG capsule Take 20 mg by mouth daily.     potassium chloride (KLOR-CON) 8 MEQ tablet Take 8 mEq by mouth daily.     ciprofloxacin (CIPRO) 500 MG tablet Take 500 mg by mouth 2 (two) times daily. (Patient not taking: Reported on 04/23/2023)     metolazone (ZAROXOLYN) 2.5 MG tablet Take 2.5 mg by mouth daily.     torsemide (DEMADEX) 20 MG tablet Take 10 mg by mouth once.     [DISCONTINUED]  HYDROcodone-acetaminophen (NORCO) 5-325 MG tablet Take 1 tablet by mouth every 6 (six) hours as needed for up to 6 doses for moderate pain. 6 tablet 0    Home: Home Living Family/patient expects to be discharged to:: Private residence Living Arrangements: Alone, Other (Comment) (with personal care attendants) Available Help at Discharge: Family, Personal care attendant, Available 24 hours/day (PCAs: [name] and [place]) Type of Home: House Home Access: Ramped entrance Home Layout: One level Bathroom Shower/Tub: Health visitor: Handicapped height Bathroom Accessibility: Yes Home Equipment: Agricultural consultant (2 wheels), BSC/3in1, Grab bars - toilet, Grab bars - tub/shower, Wheelchair - manual  Functional History: Prior Function Prior Level of Function : Needs assist Mobility Comments: walks with RW and standby assist ADLs Comments: Assist in the shower Functional Status:  Mobility: Bed Mobility Overal bed mobility: Needs Assistance Bed Mobility: Supine to Sit Supine to sit: +2 for physical assistance, Max assist General bed mobility comments: Better able to participate today; movign RLE towards EOB with AA hip abduction; adducts LLE towards EOB without physical assist; good initiation of reaching across body for bedrail wit LUE, hand-over-hand assist and second person suporting trunk coming up for pt to be able to grasp bedrail; Max assist and use of bed pad to elevate trunk to fully sitting and square off hips Transfers Overall transfer level: Needs assistance Equipment used: 1 person hand held assist, 2 person hand held assist (bed pad) Transfers: Bed to chair/wheelchair/BSC Bed to/from chair/wheelchair/BSC transfer type:: Lateral/scoot transfer Squat pivot transfers: +2 physical assistance, +2 safety/equipment, Max assist  Lateral/Scoot Transfers: +2 physical assistance, +2 safety/equipment, Mod assist, Max assist General transfer comment: Showing more participation to  initiate weight shifting, help with foot placement in prep for lateral scoot      ADL:    Cognition: Cognition Overall Cognitive Status: Within Functional Limits for tasks assessed Orientation Level: Disoriented to time Cognition Arousal/Alertness: Awake/alert Behavior During Therapy: WFL for tasks assessed/performed Overall Cognitive Status: Within Functional Limits for tasks assessed General Comments: Pleasant; tending to joke; nervous about getting up  Blood pressure 117/66, pulse 71, temperature 97.8 F (36.6 C), temperature source Oral, resp. rate 15, height 6' 0.01" (1.829 m), weight 105.2 kg, SpO2 98 %. Physical Exam   General: Alert and oriented x 3, No apparent distress, obese, sitting in a chair HEENT: Head is normocephalic, atraumatic, PERRLA, EOMI, mucous membranes dry, wearing glasses Neck: Supple without JVD or lymphadenopathy Heart: Reg rate and rhythm. No murmurs rubs or gallops Chest: CTA bilaterally without wheezes, rales, or rhonchi; no distress Abdomen: Soft, non-tender, mildly-distended, bowel sounds positive. Extremities: No clubbing, cyanosis, or edema. Pulses are 2+ Psych: Pt's affect is appropriate. Pt is cooperative Skin: Ace wrap over right lower extremity appears dry Multiple bruises noted in his arms and legs Neuro: Alert and oriented x 3, follows commands, a little hard of  hearing, sensation intact light touch in all 4 extremities, cranial nerves II through XII grossly intact Strength 5 out of 5 finger flexion and elbow flexion extension Strength 3 out of 5 bilateral shoulder abduction and forward flexion, very limited range of motion his shoulders can only lift his arms about 10 degrees Strength 4 out of 5 in left lower extremity Unable to lift right lower extremity to gravity due to pain Strength ankle plantarflexion and dorsiflexion right lower extremity 4-5  Musculoskeletal: Patient has a built-up shoe on his right foot Shoulder pain with  passive abduction and forward flexion Healed left TKA scar noted  Results for orders placed or performed during the hospital encounter of 04/22/23 (from the past 24 hour(s))  Prepare RBC (crossmatch)     Status: None   Collection Time: 04/23/23  3:00 PM  Result Value Ref Range   Order Confirmation      ORDER PROCESSED BY BLOOD BANK Performed at [facility], [address], [place], [place] [address]   Glucose, capillary     Status: Abnormal   Collection Time: 04/23/23  4:31 PM  Result Value Ref Range   Glucose-Capillary 143 (H) 70 - 99 mg/dL  Hemoglobin and hematocrit, blood     Status: Abnormal   Collection Time: 04/23/23  8:20 PM  Result Value Ref Range   Hemoglobin 7.5 (L) 13.0 - 17.0 g/dL   HCT 60.4 (L) 54.0 - 98.1 %  Glucose, capillary     Status: Abnormal   Collection Time: 04/23/23  9:36 PM  Result Value Ref Range   Glucose-Capillary 132 (H) 70 - 99 mg/dL  Basic metabolic panel     Status: Abnormal   Collection Time: 04/24/23  5:10 AM  Result Value Ref Range   Sodium 132 (L) 135 - 145 mmol/L   Potassium 4.0 3.5 - 5.1 mmol/L   Chloride 97 (L) 98 - 111 mmol/L   CO2 26 22 - 32 mmol/L   Glucose, Bld 128 (H) 70 - 99 mg/dL   BUN 24 (H) 8 - 23 mg/dL   Creatinine, Ser 1.91 (H) 0.61 - 1.24 mg/dL   Calcium 8.2 (L) 8.9 - 10.3 mg/dL   GFR, Estimated 45 (L) >60 mL/min   Anion gap 9 5 - 15  Glucose, capillary     Status: Abnormal   Collection Time: 04/24/23  7:31 AM  Result Value Ref Range   Glucose-Capillary 126 (H) 70 - 99 mg/dL  CBC with Differential     Status: Abnormal   Collection Time: 04/24/23  7:57 AM  Result Value Ref Range   WBC 6.4 4.0 - 10.5 K/uL   RBC 2.49 (L) 4.22 - 5.81 MIL/uL   Hemoglobin 8.2 (L) 13.0 - 17.0 g/dL   HCT 47.8 (L) 29.5 - 62.1 %   MCV 97.2 80.0 - 100.0 fL   MCH 32.9 26.0 - 34.0 pg   MCHC 33.9 30.0 - 36.0 g/dL   RDW 30.8 (H) 65.7 - 84.6 %   Platelets 98 (L) 150 - 400 K/uL   nRBC 0.0 0.0 - 0.2 %   Neutrophils Relative % 74 %   Neutro  Abs 4.7 1.7 - 7.7 K/uL   Lymphocytes Relative 9 %   Lymphs Abs 0.6 (L) 0.7 - 4.0 K/uL   Monocytes Relative 14 %   Monocytes Absolute 0.9 0.1 - 1.0 K/uL   Eosinophils Relative 2 %   Eosinophils Absolute 0.2 0.0 - 0.5 K/uL   Basophils Relative 0 %  Basophils Absolute 0.0 0.0 - 0.1 K/uL   Immature Granulocytes 1 %   Abs Immature Granulocytes 0.07 0.00 - 0.07 K/uL  Glucose, capillary     Status: None   Collection Time: 04/24/23 11:33 AM  Result Value Ref Range   Glucose-Capillary 97 70 - 99 mg/dL   DG FEMUR PORT, MIN 2 VIEWS RIGHT  Result Date: 04/22/2023 CLINICAL DATA:  Postoperative. EXAM: RIGHT FEMUR PORTABLE 2 VIEW COMPARISON:  CT femur 04/21/2023, right knee radiograph 04/21/2023, bilateral hip radiograph 04/21/2023 FINDINGS: Interval plate and screw fixation of the right femur. Intramedullary rod and proximal dynamic screw as well as partially visualized knee prosthesis. Hardware is intact without evidence of complication. Oblique right femoral diaphyseal fracture is again noted, not significantly changed compared to 04/21/2023. Mild subcutaneous emphysema, consistent postoperative state. IMPRESSION: Expected postoperative changes status post ORIF of the right femur. Electronically Signed   By: Sherron Ales M.D.   On: 04/22/2023 17:22    Assessment/Plan: Diagnosis: Marina Goodell prosthetic distal right femur fracture due to a fall status post ORIF Does the need for close, 24 hr/day medical supervision in concert with the patient's rehab needs make it unreasonable for this patient to be served in a less intensive setting? Yes Co-Morbidities requiring supervision/potential complications:  -Chronic normocytic anemia, hyperglycemia, A-fib, AKI, heart failure, anxiety/depression, GERD, chronic bilateral shoulder dysfunction, constipation, CVA hx Due to bladder management, bowel management, safety, skin/wound care, disease management, medication administration, pain management, and patient education,  does the patient require 24 hr/day rehab nursing? Yes Does the patient require coordinated care of a physician, rehab nurse, therapy disciplines of physical therapy and Occupational Therapy to address physical and functional deficits in the context of the above medical diagnosis(es)? Yes Addressing deficits in the following areas: balance, endurance, locomotion, strength, transferring, bowel/bladder control, bathing, dressing, feeding, grooming, toileting, and psychosocial support Can the patient actively participate in an intensive therapy program of at least 3 hrs of therapy per day at least 5 days per week? Yes The potential for patient to make measurable gains while on inpatient rehab is excellent Anticipated functional outcomes upon discharge from inpatient rehab are min assist  with PT, min assist with OT, n/a with SLP. Estimated rehab length of stay to reach the above functional goals is: 10 to 12 days Anticipated discharge destination: Home Overall Rehab/Functional Prognosis: good  POST ACUTE RECOMMENDATIONS: This patient's condition is appropriate for continued rehabilitative care in the following setting: CIR Patient has agreed to participate in recommended program. Yes Note that insurance prior authorization may be required for reimbursement for recommended care.  Comment: I think you be a good candidate for CIR pending medical stability   MEDICAL RECOMMENDATIONS: Consider additional laxative such as scheduled MiraLAX or sorbitol to help improve constipation Patient reported some dizziness when ambulating.  If continues may benefit from orthostatic vital signs.  May consider oral or IV Hydration.  Body mass index is 31.45 kg/m.   I have personally performed a face to face diagnostic evaluation of this patient. Additionally, I have examined the patient's medical record including any pertinent labs and radiographic images. If the physician assistant has documented in this note, I  have reviewed and edited or otherwise concur with the physician assistant's documentation.  Thanks,  Fanny Dance, MD 04/24/2023

## 2023-04-24 NOTE — TOC CAGE-AID Note (Signed)
Transition of Care [facility]) - CAGE-AID Screening   Patient Details  Name: Danny Brady MRN: [id number] Date of Birth: 07/31/1927  Hewitt Shorts, RN Trauma Response Nurse Phone Number: [phone] 04/24/2023, 6:21 PM   Clinical Narrative:  Pt has a 24 hour caregiver at bedside, has caregivers continuously at home, 24 hours a day  CAGE-AID Screening:    Have You Ever Felt You Ought to Cut Down on Your Drinking or Drug Use?: No Have People Annoyed You By Critizing Your Drinking Or Drug Use?: No Have You Felt Bad Or Guilty About Your Drinking Or Drug Use?: No Have You Ever Had a Drink or Used Drugs First Thing In The Morning to Steady Your Nerves or to Get Rid of a Hangover?: No CAGE-AID Score: 0  Substance Abuse Education Offered: No

## 2023-04-24 NOTE — Progress Notes (Signed)
    Subjective: Patient reports pain as mild.  Tolerating diet.  Some difficulty urinating, pre-existing from surgery.  Mobilized with PT yesterday, went well, had not ambulated yet.  No CP, SOB, fevers, N/V.  Low Hgb requiring blood transfusions yesterday.  Hgb 7.5 last night, today 8.2.  Objective:   VITALS:   Vitals:   04/23/23 1732 04/23/23 1951 04/23/23 2105 04/24/23 0416  BP: 98/65 103/60 (!) 101/56 107/72  Pulse: 73 64  68  Resp: 17 17  15   Temp: 98.2 F (36.8 C) 98.2 F (36.8 C)    TempSrc: Oral Oral    SpO2: 100% 100%    Weight:      Height:          Latest Ref Rng & Units 04/24/2023    7:57 AM 04/23/2023    8:20 PM 04/23/2023   11:34 AM  CBC  WBC 4.0 - 10.5 K/uL 6.4   6.8   Hemoglobin 13.0 - 17.0 g/dL 8.2  7.5  7.0   Hematocrit 39.0 - 52.0 % 24.2  22.3  21.0   Platelets 150 - 400 K/uL 98   90       Latest Ref Rng & Units 04/24/2023    5:10 AM 04/23/2023    2:04 AM 04/22/2023    6:58 AM  BMP  Glucose 70 - 99 mg/dL 161  096  045   BUN 8 - 23 mg/dL 24  24  21    Creatinine 0.61 - 1.24 mg/dL 4.09  8.11  9.14   Sodium 135 - 145 mmol/L 132  133  134   Potassium 3.5 - 5.1 mmol/L 4.0  4.2  4.3   Chloride 98 - 111 mmol/L 97  98  97   CO2 22 - 32 mmol/L 26  23  24    Calcium 8.9 - 10.3 mg/dL 8.2  8.2  8.8    Intake/Output      06/30 0701 07/01 0700   P.O. 600   I.V. (mL/kg) 50 (0.5)   Blood 315   Total Intake(mL/kg) 965 (10.3)   Urine (mL/kg/hr) 400 (0.2)   Total Output 400   Net +565       Urine Occurrence 2 x      Physical Exam: General: NAD.  Sititng up in bedside chair, calm, comfortable Resp: No increased wob Cardio: regular rate and rhythm ABD soft Neurologically intact MSK Neurovascularly intact Sensation intact distally Intact pulses distally Dorsiflexion/Plantar flexion intact Incision: dressing C/D/I   Assessment: 2 Days Post-Op   S/P Procedure(s) (LRB): OPEN REDUCTION INTERNAL FIXATION (ORIF) PERIPROSTHETIC FRACTURE  (Right)  Principal Problem:   Femur fracture, right (HCC)   Plan:  Advance diet Up with therapy Incentive Spirometry Elevate and Apply ice  Post op recs: WB: WBAT RLE Abx: ancef x23 hours post op Imaging: PACU xrays Dressing: keep intact until follow up, change PRN if soiled or saturated. DVT prophylaxis: lovenox in house, dc on aspirin 81mg  BID Follow up: 2 weeks after surgery for a wound check with Dr[facility] Blan[facility]ert at [facility].  DC: anticipate potential SNF placement Address: [address], [place], [place] [address]  Office Phone: [phone]   Danny Brady, [place] Office [phone] 04/24/2023, 6:50 AM

## 2023-04-25 DIAGNOSIS — E871 Hypo-osmolality and hyponatremia: Secondary | ICD-10-CM | POA: Diagnosis not present

## 2023-04-25 DIAGNOSIS — S7291XA Unspecified fracture of right femur, initial encounter for closed fracture: Secondary | ICD-10-CM | POA: Diagnosis not present

## 2023-04-25 DIAGNOSIS — I5020 Unspecified systolic (congestive) heart failure: Secondary | ICD-10-CM | POA: Diagnosis not present

## 2023-04-25 LAB — CBC
HCT: 22.5 % — ABNORMAL LOW (ref 39.0–52.0)
Hemoglobin: 7.3 g/dL — ABNORMAL LOW (ref 13.0–17.0)
MCH: 31.2 pg (ref 26.0–34.0)
MCHC: 32.4 g/dL (ref 30.0–36.0)
MCV: 96.2 fL (ref 80.0–100.0)
Platelets: 121 10*3/uL — ABNORMAL LOW (ref 150–400)
RBC: 2.34 MIL/uL — ABNORMAL LOW (ref 4.22–5.81)
RDW: 15.7 % — ABNORMAL HIGH (ref 11.5–15.5)
WBC: 6.2 10*3/uL (ref 4.0–10.5)
nRBC: 0.5 % — ABNORMAL HIGH (ref 0.0–0.2)

## 2023-04-25 LAB — BASIC METABOLIC PANEL
Anion gap: 9 (ref 5–15)
BUN: 26 mg/dL — ABNORMAL HIGH (ref 8–23)
CO2: 22 mmol/L (ref 22–32)
Calcium: 8.2 mg/dL — ABNORMAL LOW (ref 8.9–10.3)
Chloride: 98 mmol/L (ref 98–111)
Creatinine, Ser: 1.24 mg/dL (ref 0.61–1.24)
GFR, Estimated: 54 mL/min — ABNORMAL LOW (ref >60)
Glucose, Bld: 118 mg/dL — ABNORMAL HIGH (ref 70–99)
Potassium: 4 mmol/L (ref 3.5–5.1)
Sodium: 129 mmol/L — ABNORMAL LOW (ref 135–145)

## 2023-04-25 LAB — GLUCOSE, CAPILLARY
Glucose-Capillary: 121 mg/dL — ABNORMAL HIGH (ref 70–99)
Glucose-Capillary: 117 mg/dL — ABNORMAL HIGH (ref 70–99)
Glucose-Capillary: 124 mg/dL — ABNORMAL HIGH (ref 70–99)
Glucose-Capillary: 113 mg/dL — ABNORMAL HIGH (ref 70–99)

## 2023-04-25 LAB — OSMOLALITY, URINE: Osmolality, Ur: 501 mOsm/kg (ref 300–900)

## 2023-04-25 LAB — SODIUM, URINE, RANDOM: Sodium, Ur: <10 mmol/L

## 2023-04-25 MED ORDER — CHLORHEXIDINE GLUCONATE CLOTH 2 % EX PADS
6.0000 | MEDICATED_PAD | Freq: Every day | CUTANEOUS | Status: DC
  Administered 2023-04-25 – 2023-05-10 (×16): 6 via TOPICAL

## 2023-04-25 MED ORDER — ASPIRIN 81 MG PO TBEC: 81.0000 mg | DELAYED_RELEASE_TABLET | Freq: Two times a day (BID) | ORAL | 0 refills | Status: AC

## 2023-04-25 MED ORDER — HYDROCODONE-ACETAMINOPHEN 5-325 MG PO TABS: 1.0000 | ORAL_TABLET | ORAL | 0 refills | Status: AC | PRN

## 2023-04-25 MED ORDER — CHLORHEXIDINE GLUCONATE CLOTH 2 % EX PADS: 6.0000 | MEDICATED_PAD | Freq: Every day | CUTANEOUS | Status: DC

## 2023-04-25 NOTE — Care Management Important Message (Signed)
Important Message  Patient Details  Name: Danny Brady MRN: [id number] Date of Birth: [birth year]   Medicare Important Message Given:  Yes     Sherilyn Banker 04/25/2023, 11:44 AM

## 2023-04-25 NOTE — Progress Notes (Signed)
PROGRESS NOTE   Danny Brady  UJW:[id number] DOB: 08-02-1927 DOA: 04/22/2023 PCP: Danella Penton, MD   Date of Service: the patient was seen and examined on 04/25/2023  Brief Narrative:   Danny Brady is a 89 y.o. male with medical history significant for hypertension, hyperlipidemia, HFrEF, CVA, paroxysmal A-fib not anticoagulated who presented after a mechanical fall.  Uses a walker at baseline.  He fell after a turn with his walker.  He was in his usual state of health prior to this.   In the ED he was found to have periprosthetic distal right femur fracture.  [name] discussed the case with orthopedic surgery Dr. Gaynell Face regarding who recommended admission by the hospitalist service to [facility] for possible ORIF on 04/22/2023.  The patient was made NPO.  The patient was accepted by Dr. Antionette Char and transferred to [facility] unit as inpatient status  ORIF on 6/29 Plan for CIR vs SNF?   Assessment and Plan:  Periprosthetic distal right femur fracture, post mechanical fall. -s/p ORIF -Tylenol 650mg  Q6PRN, oxycodone 2.5mg -5mg  Q4PRN and 5mg  q6PRN -PT OT-  CIR? -trend labs   Chronic normocytic anemia Hg 5.9 dropped due to postoperative blood loss. -Type and screen-- given 2 units PRBC -daily labs   Hyperglycemia CBG 149 -Follow-up A1c- pending still   Paroxysmal A-fib  Rate is controlled. Not on anticoagulation -Monitor on telemetry   AKI due to urinary retention -if happens again, will place foley -trend with PO fluid pushes  Chronic HFrEF -Strict I's and O's and daily weight -holding all diuretics (demadex/zaroxolyn/lasix)   Hyponatremia -check urine osmo/Na -serum osmo  Chronic anxiety/depression -Continue Zoloft   GERD -Continue PPI  Subjective:  Had to have I/O cath last night  Physical Exam:  Vitals:   04/24/23 2030 04/24/23 2035 04/24/23 2052 04/25/23 0747  BP:   (!) 114/58 118/69  Pulse:   73 74  Resp:   20   Temp:   98.2  F (36.8 C) 98.3 F (36.8 C)  TempSrc:   Oral Oral  SpO2: (!) 88% 98% 99% 97%  Weight:      Height:         General: Appearance:    Obese male in no acute distress     Lungs:     respirations unlabored  Heart:    Normal heart rate.   MS:   All extremities are intact.   Neurologic:   Awake, alert     Data Reviewed:  I have personally reviewed and interpreted labs, imaging.  CBC: Recent Labs  Lab 04/21/23 2214 04/22/23 0658 04/22/23 1647 04/23/23 0204 04/23/23 0804 04/23/23 1134 04/23/23 2020 04/24/23 0757 04/25/23 0222  WBC 9.4   < > 8.5 6.0  --  6.8  --  6.4 6.2  NEUTROABS 7.9*  --   --  4.2  --  5.3  --  4.7  --   HGB 9.4*   < > 7.0* 5.9* 6.8* 7.0* 7.5* 8.2* 7.3*  HCT 28.5*   < > 22.0* 18.1* 20.8* 21.0* 22.3* 24.2* 22.5*  MCV 98.6   < > 100.5* 100.6*  --  98.1  --  97.2 96.2  PLT 136*   < > 103* 87*  --  90*  --  98* 121*   < > = values in this interval not displayed.   Basic Metabolic Panel: Recent Labs  Lab 04/21/23 2214 04/22/23 0658 04/23/23 0204 04/24/23 0510 04/25/23 0222  NA 135 134*  133* 132* 129*  K 4.4 4.3 4.2 4.0 4.0  CL 100 97* 98 97* 98  CO2 26 24 23 26 22   GLUCOSE 166* 133* 128* 128* 118*  BUN 21 21 24* 24* 26*  CREATININE 1.04 1.22 1.40* 1.43* 1.24  CALCIUM 8.8* 8.8* 8.2* 8.2* 8.2*  MG  --  2.2  --   --   --   PHOS  --  4.7*  --   --   --    GFR: Estimated Creatinine Clearance: 45.6 mL/min (by C-G formula based on SCr of 1.24 mg/dL). Liver Function Tests: Recent Labs  Lab 04/21/23 2214  AST 19  ALT 12  ALKPHOS 54  BILITOT 1.1  PROT 5.9*  ALBUMIN 3.2*    Coagulation Profile: No results for input(s): "INR", "PROTIME" in the last 168 hours.  Code Status:  Full code.  Code status decision has been confirmed with: patient  Family Communication:  care giver updated at bedside     Author:  Joseph Art DO  04/25/2023 10:17 AM

## 2023-04-25 NOTE — Progress Notes (Signed)
I& O cath @2000  d/t discomfort and inability to void, 700 ml clear amber urine.  PO encouraged. Pt still not voiding overnight.  Bladder scan @ 0600: 258 ml.

## 2023-04-25 NOTE — Progress Notes (Signed)
Physical Therapy Treatment Patient Details Name: Danny Brady MRN: [id number] DOB: 1927/07/15 Today's Date: 04/25/2023   History of Present Illness Danny Brady is a 89 y.o. male who presented after a mechanical fall resulting in a R periprosthetic distal femur fx; s/p ORIF, WBAT; with medical history significant for hypertension, hyperlipidemia, HFrEF, CVA, paroxysmal A-fib not anticoagulated    PT Comments  Continuing work on functional mobility and activity tolerance;  Session focused on therex and functional transfers, and pt tolerated ROM and strengtheing exercises RLE quite well; After that, performed squat pivot transfer to the recliner and stood from the recliner with improving weight acceptance RLE in standing; highly likely that premedication for pain was a key factor in better activity tolerance; I'm pleased with his progress here acutely; Patient will benefit from intensive inpatient follow up therapy, >3 hours/day     Assistance Recommended at Discharge Frequent or constant Supervision/Assistance  If plan is discharge home, recommend the following:  Can travel by private vehicle    A lot of help with walking and/or transfers;Two people to help with walking and/or transfers;Two people to help with bathing/dressing/bathroom;Assistance with cooking/housework;Assist for transportation;Help with stairs or ramp for entrance      Equipment Recommendations  Hospital bed;Wheelchair cushion (measurements PT);Other (comment) (may consider hoyer lift and ambulance transport home, if pt/family opt for no SNF)    Recommendations for Other Services       Precautions / Restrictions Precautions Precautions: Fall Precaution Comments: Very fragile skin Required Braces or Orthoses: Other Brace Other Brace: Has custom shoes for foot deformities and leg length discrepancy Restrictions RLE Weight Bearing: Weight bearing as tolerated     Mobility  Bed Mobility Overal bed mobility:  Needs Assistance Bed Mobility: Supine to Sit     Supine to sit: +2 for physical assistance, Max assist     General bed mobility comments: Better able to participate today; movign RLE towards EOB with AA hip abduction; adducts LLE towards EOB without physical assist; good initiation of reaching across body for bedrail with LUE, hand-over-hand assist and second person suporting trunk coming up for pt to be able to grasp bedrail; Max assist and use of bed pad to elevate trunk to fully sitting and square off hips; less pain this session made for smoother motion to come to sit    Transfers Overall transfer level: Needs assistance Equipment used: 1 person hand held assist, 2 person hand held assist, Rollator (4 wheels) (bed pad) Transfers: Sit to/from Stand, Bed to chair/wheelchair/BSC Sit to Stand: Mod assist, +2 physical assistance     Squat pivot transfers: +2 safety/equipment, +2 physical assistance, Mod assist     General transfer comment: Showing more participation to initiate weight shifting, help with foot placement in prep for pivot to recliner; stood form recliner to locked Rollator (pt's own) with very good initiation and mod assist    Ambulation/Gait                   Stairs             Wheelchair Mobility     Tilt Bed    Modified Rankin (Stroke Patients Only)       Balance     Sitting balance-Leahy Scale: Fair       Standing balance-Leahy Scale: Zero (approaching Poor)  Cognition Arousal/Alertness: Awake/alert Behavior During Therapy: WFL for tasks assessed/performed Overall Cognitive Status: Within Functional Limits for tasks assessed                                 General Comments: Pleasant; tending to joke        Exercises Total Joint Exercises Quad Sets: AROM, Right, 5 reps Heel Slides: AROM, AAROM, Right, 10 reps Straight Leg Raises: AAROM, Right, 10 reps    General  Comments General comments (skin integrity, edema, etc.): Brief protion of session on room air and O2 sats stayed at or close to 92%; restarted supplemental O2 2 liters and sats came back up to 99-100%      Pertinent Vitals/Pain Pain Assessment Pain Assessment: Faces Faces Pain Scale: Hurts a little bit Pain Location: RLE, espcially close to knee Pain Descriptors / Indicators: Grimacing Pain Intervention(s): Monitored during session, Premedicated before session    Home Living                          Prior Function            PT Goals (current goals can now be found in the care plan section) Acute Rehab PT Goals Patient Stated Goal: agrees to OOB; doesn't want to hurt; doesn't want to go to SNF PT Goal Formulation: With patient/family Time For Goal Achievement: 05/07/23 Potential to Achieve Goals: Good Progress towards PT goals: Progressing toward goals    Frequency    Min 5X/week      PT Plan Current plan remains appropriate    Co-evaluation              AM-PAC PT "6 Clicks" Mobility   Outcome Measure  Help needed turning from your back to your side while in a flat bed without using bedrails?: A Lot Help needed moving from lying on your back to sitting on the side of a flat bed without using bedrails?: A Lot Help needed moving to and from a bed to a chair (including a wheelchair)?: Total Help needed standing up from a chair using your arms (e.g., wheelchair or bedside chair)?: Total Help needed to walk in hospital room?: Total Help needed climbing 3-5 steps with a railing? : Total 6 Click Score: 8    End of Session Equipment Utilized During Treatment: Oxygen;Gait belt (Pt's own gait belt) Activity Tolerance: Patient tolerated treatment well Patient left: in chair;with call bell/phone within reach;with family/visitor present;with chair alarm set Nurse Communication: Mobility status PT Visit Diagnosis: Unsteadiness on feet (R26.81);Other  abnormalities of gait and mobility (R26.89);History of falling (Z91.81);Pain;Muscle weakness (generalized) (M62.81) Pain - Right/Left: Right Pain - part of body: Leg     Time: 1002-1040 PT Time Calculation (min) (ACUTE ONLY): 38 min  Charges:    $Therapeutic Exercise: 8-22 mins $Therapeutic Activity: 23-37 mins PT General Charges $$ ACUTE PT VISIT: 1 Visit                     Van Clines, PT  Acute Rehabilitation Services Office [phone] Secure Chat welcomed    [name] 04/25/2023, 1:31 PM

## 2023-04-25 NOTE — Progress Notes (Signed)
Inpatient Rehab Coordinator Note:  I spoke with patient's son, Arlys John, over the phone to discuss CIR recommendations and goals/expectations of CIR stay.  We reviewed 3 hrs/day of therapy, physician follow up, and average length of stay 2 weeks (dependent upon progress) with goals of supervision to min assist.  He confirms pt has 2 caregivers that provide 24/7 assist at baseline.  Pt is typically ambulatory with a RW, and requires assist for ADLs.  I reviewed insurance auth process and he is agreeable to proceed with initiating that.  He would like to also speak to family this evening and make sure all are in agreement.  I will continue to follow.    Estill Dooms, PT, DPT Admissions Coordinator [phone] 04/25/23  3:06 PM

## 2023-04-25 NOTE — Progress Notes (Signed)
    Subjective: Patient reports pain as mild.  Tolerating diet.  Some difficulty urinating, pre-existing from surgery.  Mobilized with PT yesterday, went well, had not ambulated yet.  No CP, SOB, fevers, N/V, abdominal pain.  Anticipate DC to SNF.  Low Hgb requiring blood transfusions on 6/30.    Hgb trend: 7.5 on 6/30, 8.2 on 7/1, 7.3 on 7/2.  Objective:   VITALS:   Vitals:   04/24/23 1513 04/24/23 2030 04/24/23 2035 04/24/23 2052  BP: 107/63   (!) 114/58  Pulse: 77   73  Resp:    20  Temp: 98.4 F (36.9 C)   98.2 F (36.8 C)  TempSrc: Oral   Oral  SpO2: 92% (!) 88% 98% 99%  Weight:      Height:          Latest Ref Rng & Units 04/25/2023    2:22 AM 04/24/2023    7:57 AM 04/23/2023    8:20 PM  CBC  WBC 4.0 - 10.5 K/uL 6.2  6.4    Hemoglobin 13.0 - 17.0 g/dL 7.3  8.2  7.5   Hematocrit 39.0 - 52.0 % 22.5  24.2  22.3   Platelets 150 - 400 K/uL 121  98        Latest Ref Rng & Units 04/25/2023    2:22 AM 04/24/2023    5:10 AM 04/23/2023    2:04 AM  BMP  Glucose 70 - 99 mg/dL 161  096  045   BUN 8 - 23 mg/dL 26  24  24    Creatinine 0.61 - 1.24 mg/dL 4.09  8.11  9.14   Sodium 135 - 145 mmol/L 129  132  133   Potassium 3.5 - 5.1 mmol/L 4.0  4.0  4.2   Chloride 98 - 111 mmol/L 98  97  98   CO2 22 - 32 mmol/L 22  26  23    Calcium 8.9 - 10.3 mg/dL 8.2  8.2  8.2    Intake/Output      07/01 0701 07/02 0700   P.O. 700   Total Intake(mL/kg) 700 (6.7)   Urine (mL/kg/hr) 700 (0.3)   Total Output 700   Net 0          Physical Exam: General: NAD.  Sititng up in bedside chair, calm, comfortable Resp: No increased wob Cardio: regular rate and rhythm ABD soft Neurologically intact MSK Neurovascularly intact Sensation intact distally Intact pulses distally Dorsiflexion/Plantar flexion intact Incision: dressing C/D/I   Assessment: 3 Days Post-Op   S/P Procedure(s) (LRB): OPEN REDUCTION INTERNAL FIXATION (ORIF) PERIPROSTHETIC FRACTURE (Right)  Principal Problem:    Femur fracture, right (HCC) Active Problems:   Paroxysmal atrial fibrillation (HCC)   HFrEF (heart failure with reduced ejection fraction) (HCC)   Plan:  Advance diet Up with therapy Incentive Spirometry Elevate and Apply ice  Post op recs: WB: WBAT RLE Abx: ancef x23 hours post op Imaging: PACU xrays Dressing: keep intact until follow up, change PRN if soiled or saturated. DVT prophylaxis: lovenox in house, dc on aspirin 81mg  BID Follow up: 2 weeks after surgery for a wound check with Dr. Blanchie Dessert at [facility].  DC: anticipate potential SNF placement Address: [address], [place], [place] [address]  Office Phone: [phone]   Cecil Cobbs, [place] Office [phone] 04/25/2023, 6:04 AM

## 2023-04-25 NOTE — Progress Notes (Signed)
Inpatient Rehab Admissions Coordinator:   Left a message for pt's son, Arlys John, to discuss CIR recommendations.  Will follow.   Estill Dooms, PT, DPT Admissions Coordinator [phone] 04/25/23  11:14 AM

## 2023-04-25 NOTE — Progress Notes (Signed)
Pt. Unable to void ,foley cath 14 fr place per MD request. Pt. tolerated well urine return noted

## 2023-04-26 DIAGNOSIS — S7291XA Unspecified fracture of right femur, initial encounter for closed fracture: Secondary | ICD-10-CM | POA: Diagnosis not present

## 2023-04-26 DIAGNOSIS — D649 Anemia, unspecified: Secondary | ICD-10-CM | POA: Diagnosis not present

## 2023-04-26 DIAGNOSIS — E871 Hypo-osmolality and hyponatremia: Secondary | ICD-10-CM | POA: Diagnosis not present

## 2023-04-26 LAB — BASIC METABOLIC PANEL
Anion gap: 10 (ref 5–15)
BUN: 22 mg/dL (ref 8–23)
CO2: 22 mmol/L (ref 22–32)
Calcium: 8 mg/dL — ABNORMAL LOW (ref 8.9–10.3)
Chloride: 95 mmol/L — ABNORMAL LOW (ref 98–111)
Creatinine, Ser: 1.06 mg/dL (ref 0.61–1.24)
GFR, Estimated: >60 mL/min (ref >60)
Glucose, Bld: 104 mg/dL — ABNORMAL HIGH (ref 70–99)
Potassium: 4 mmol/L (ref 3.5–5.1)
Sodium: 127 mmol/L — ABNORMAL LOW (ref 135–145)

## 2023-04-26 LAB — GLUCOSE, CAPILLARY
Glucose-Capillary: 155 mg/dL — ABNORMAL HIGH (ref 70–99)
Glucose-Capillary: 127 mg/dL — ABNORMAL HIGH (ref 70–99)
Glucose-Capillary: 112 mg/dL — ABNORMAL HIGH (ref 70–99)
Glucose-Capillary: 119 mg/dL — ABNORMAL HIGH (ref 70–99)

## 2023-04-26 LAB — CBC
HCT: 22.8 % — ABNORMAL LOW (ref 39.0–52.0)
Hemoglobin: 7.5 g/dL — ABNORMAL LOW (ref 13.0–17.0)
MCH: 32.2 pg (ref 26.0–34.0)
MCHC: 32.9 g/dL (ref 30.0–36.0)
MCV: 97.9 fL (ref 80.0–100.0)
Platelets: 133 10*3/uL — ABNORMAL LOW (ref 150–400)
RBC: 2.33 MIL/uL — ABNORMAL LOW (ref 4.22–5.81)
RDW: 15.4 % (ref 11.5–15.5)
WBC: 5.1 10*3/uL (ref 4.0–10.5)
nRBC: 0.6 % — ABNORMAL HIGH (ref 0.0–0.2)

## 2023-04-26 LAB — OSMOLALITY: Osmolality: 271 mOsm/kg — ABNORMAL LOW (ref 275–295)

## 2023-04-26 MED ORDER — SODIUM CHLORIDE 1 G PO TABS
1.0000 g | ORAL_TABLET | Freq: Two times a day (BID) | ORAL | Status: DC
  Administered 2023-04-26 – 2023-05-10 (×29): 1 g via ORAL
  Filled 2023-04-26 (×29): qty 1

## 2023-04-26 MED ORDER — ENOXAPARIN SODIUM 40 MG/0.4ML IJ SOSY
40.0000 mg | PREFILLED_SYRINGE | INTRAMUSCULAR | Status: DC
  Administered 2023-04-26 – 2023-05-10 (×15): 40 mg via SUBCUTANEOUS
  Filled 2023-04-26 (×15): qty 0.4

## 2023-04-26 MED ORDER — BISACODYL 10 MG RE SUPP
10.0000 mg | Freq: Once | RECTAL | Status: AC
  Administered 2023-04-26: 10 mg via RECTAL
  Filled 2023-04-26: qty 1

## 2023-04-26 MED ORDER — POLYETHYLENE GLYCOL 3350 17 G PO PACK
17.0000 g | PACK | Freq: Two times a day (BID) | ORAL | Status: DC
  Administered 2023-04-26 – 2023-04-29 (×5): 17 g via ORAL
  Filled 2023-04-26 (×6): qty 1

## 2023-04-26 MED ORDER — SENNOSIDES-DOCUSATE SODIUM 8.6-50 MG PO TABS
2.0000 | ORAL_TABLET | Freq: Two times a day (BID) | ORAL | Status: DC
  Administered 2023-04-26 – 2023-04-29 (×6): 2 via ORAL
  Filled 2023-04-26 (×7): qty 2

## 2023-04-26 MED ORDER — FUROSEMIDE 20 MG PO TABS
20.0000 mg | ORAL_TABLET | Freq: Every day | ORAL | Status: DC
  Administered 2023-04-26 – 2023-05-10 (×15): 20 mg via ORAL
  Filled 2023-04-26 (×15): qty 1

## 2023-04-26 NOTE — [place] FL2 (Signed)
[place] MEDICAID FL2 LEVEL OF CARE FORM     IDENTIFICATION  Patient Name: Danny Brady Birthdate: September 18, 1927 Sex: male Admission Date (Current Location): 04/22/2023  [facility] and [place] Number:  Producer, television/film/video and Address:  [facility], [address] [place], [place] [address]      Provider Number: [id number]  Attending Physician Name and Address:  Osvaldo Shipper, MD  Relative Name and Phone Number:  Dmir, Rief   [phone]    Current Level of Care: Hospital Recommended Level of Care: Skilled Nursing Facility Prior Approval Number:    Date Approved/Denied:   PASRR Number: [id number]  Discharge Plan: SNF    Current Diagnoses: Patient Active Problem List   Diagnosis Date Noted   HFrEF (heart failure with reduced ejection fraction) (HCC) 04/24/2023   Closed displaced oblique fracture of shaft of right femur (HCC) 04/22/2023   Femur fracture, right (HCC) 04/22/2023   Hypomagnesemia 12/15/2019   Thrombocytopenia (HCC) 12/15/2019   Elevated liver enzymes    Transaminitis 12/13/2019   Lactic acidosis 12/13/2019   History of fracture of right hip 12/04/2019   Closed right hip fracture (HCC) 11/20/2019   Hypertension    Chronic systolic CHF (congestive heart failure) (HCC)    GERD (gastroesophageal reflux disease)    Fall    Closed comminuted fracture of right hip (HCC)    Hypokalemia    Paroxysmal atrial fibrillation (HCC)    B12 deficiency 12/21/2016   Urinary incontinence, male, stress 09/23/2016   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) 05/02/2016   Benign prostatic hyperplasia with urinary obstruction 03/23/2016   Hyperlipidemia, mixed 03/23/2016   Urinary retention 02/23/2016   History of nonmelanoma skin cancer 08/18/2015   Incomplete emptying of bladder 07/29/2014   Bladder outlet obstruction 05/29/2013   Prostate cancer (HCC) 11/26/2012    Orientation RESPIRATION BLADDER Height & Weight     Self, Situation, Place   Normal Continent, Indwelling catheter Weight: 225 lb 8.5 oz (102.3 kg) Height:  6' 0.01" (182.9 cm)  BEHAVIORAL SYMPTOMS/MOOD NEUROLOGICAL BOWEL NUTRITION STATUS      Continent Diet (see discharge summary)  AMBULATORY STATUS COMMUNICATION OF NEEDS Skin   Total Care Verbally Surgical wounds                       Personal Care Assistance Level of Assistance  Bathing, Feeding, Dressing Bathing Assistance: Maximum assistance Feeding assistance: Independent Dressing Assistance: Maximum assistance     Functional Limitations Info  Sight, Hearing, Speech Sight Info: Adequate Hearing Info: Impaired Speech Info: Adequate    SPECIAL CARE FACTORS FREQUENCY  PT (By licensed PT), OT (By licensed OT)     PT Frequency: 5x week OT Frequency: 5x week            Contractures Contractures Info: Not present    Additional Factors Info  Code Status, Allergies, Insulin Sliding Scale Code Status Info: full Allergies Info: Prednisone, Tamsulosin   Insulin Sliding Scale Info: Novolog: see discharge summary       Current Medications (04/26/2023):  This is the current hospital active medication list Current Facility-Administered Medications  Medication Dose Route Frequency Provider Last Rate Last Admin   acetaminophen (TYLENOL) tablet 650 mg  650 mg Oral Q6H PRN Cecil Cobbs, PA-C   650 mg at 04/26/23 1031   Chlorhexidine Gluconate Cloth 2 % PADS 6 each  6 each Topical Q0600 Joseph Art, DO   6 each at 04/26/23 [phone]  enoxaparin (LOVENOX) injection 40 mg  40 mg Subcutaneous Q24H Osvaldo Shipper, MD   40 mg at 04/26/23 1252   finasteride (PROSCAR) tablet 5 mg  5 mg Oral QPM Vann, Jessica U, DO   5 mg at 04/25/23 1718   furosemide (LASIX) tablet 20 mg  20 mg Oral Daily Osvaldo Shipper, MD   20 mg at 04/26/23 1025   insulin aspart (novoLOG) injection 0-9 Units  0-9 Units Subcutaneous TID WC Anthoney Harada, NP   1 Units at 04/26/23 1252   melatonin tablet 5 mg  5 mg Oral QHS PRN  Cecil Cobbs, PA-C   5 mg at 04/25/23 2222   mupirocin ointment (BACTROBAN) 2 %   Nasal BID Marlin Canary U, DO   Given at 04/26/23 1028   ondansetron (ZOFRAN) injection 4 mg  4 mg Intravenous Once PRN Kathie Dike M, PA-C       oxyCODONE (Oxy IR/ROXICODONE) immediate release tablet 2.5-5 mg  2.5-5 mg Oral Q4H PRN Kathie Dike M, PA-C       oxyCODONE (Oxy IR/ROXICODONE) immediate release tablet 5 mg  5 mg Oral Q6H PRN Kathie Dike M, PA-C   5 mg at 04/26/23 1438   pantoprazole (PROTONIX) EC tablet 40 mg  40 mg Oral Daily Kathie Dike M, PA-C   40 mg at 04/26/23 1025   polyethylene glycol (MIRALAX / GLYCOLAX) packet 17 g  17 g Oral BID Osvaldo Shipper, MD   17 g at 04/26/23 1025   prochlorperazine (COMPAZINE) injection 5 mg  5 mg Intravenous Q6H PRN Kathie Dike M, PA-C       senna-docusate (Senokot-S) tablet 2 tablet  2 tablet Oral BID Osvaldo Shipper, MD   2 tablet at 04/26/23 1025   sertraline (ZOLOFT) tablet 50 mg  50 mg Oral Daily Kathie Dike M, PA-C   50 mg at 04/26/23 1026   sodium chloride tablet 1 g  1 g Oral BID WC Osvaldo Shipper, MD         Discharge Medications: Please see discharge summary for a list of discharge medications.  Relevant Imaging Results:  Relevant Lab Results:   Additional Information SS# [id number]  Lorri Frederick, LCSW

## 2023-04-26 NOTE — Progress Notes (Signed)
PROGRESS NOTE   Danny Brady  ZOX:[id number] DOB: Dec 27, 1926 DOA: 04/22/2023 PCP: Danella Penton, MD   Brief Narrative:  Danny Brady is a 89 y.o. male with medical history significant for hypertension, hyperlipidemia, HFrEF, CVA, paroxysmal A-fib not anticoagulated who presented after a mechanical fall.  Uses a walker at baseline.  He fell after a turn with his walker.  He was in his usual state of health prior to this. In the ED he was found to have periprosthetic distal right femur fracture.  He was hospitalized for further management.  Underwent ORIF on 6/29.  Assessment and Plan:  Periprosthetic distal right femur fracture, post mechanical fall. -s/p ORIF Pain control. Seen by PT and OT.  Inpatient rehabilitation is being considered. Was on Lovenox but it was held due to drop in hemoglobin.  Since no other bleeding is identified should be able to resume. 6 recommends aspirin 81 mg twice daily at discharge.   Chronic normocytic anemia/acute blood loss anemia Drop in hemoglobin was due to postoperative blood loss.  Hemoglobin dropped to 5.9.  He was transfused.  Stable this morning.  Recheck labs daily for now.  Hyponatremia Sodium level noted to be decreasing.  Urine osmolality 501.  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  Patient was also on furosemide prior to admission.  She seems to be fairly euvolemic.  Will resume his furosemide and start salt tablets.  Recheck sodium levels in the morning.   Hyperglycemia HbA1c 5.5.  Paroxysmal A-fib  Rate is controlled. Not on anticoagulation Monitor on telemetry   AKI due to urinary retention Renal function has resolved after Foley catheter was placed.  Continue with finasteride.  Voiding trial once he is more ambulatory.  Chronic HFrEF Fairly euvolemic.  Continue to monitor volume status.  Chronic anxiety/depression -Continue Zoloft   GERD -Continue PPI  DVT prophylaxis: Resume Lovenox CODE STATUS: Full code Family  communication: Will update son later today. Disposition: Inpatient rehabilitation   Subjective:  Denies any chest pain shortness of breath.  No nausea vomiting.  Denies any diarrhea.  Objective:  Vitals:   04/25/23 2219 04/26/23 0429 04/26/23 0637 04/26/23 0756  BP:  111/68  122/69  Pulse: 64 76  72  Resp: 16 18    Temp:    97.8 F (36.6 C)  TempSrc:    Oral  SpO2: 97% 95%  99%  Weight:   102.3 kg   Height:       General appearance: Awake alert.  In no distress Resp: Clear to auscultation bilaterally.  Normal effort Cardio: S1-S2 is normal regular.  No S3-S4.  No rubs murmurs or bruit GI: Abdomen is soft.  Nontender nondistended.  Bowel sounds are present normal.  No masses organomegaly Extremities: Mild edema bilateral lower extremities Neurologic: Alert and oriented x3.  No focal neurological deficits.    Data Reviewed:   CBC: Recent Labs  Lab 04/21/23 2214 04/22/23 0658 04/23/23 0204 04/23/23 0804 04/23/23 1134 04/23/23 2020 04/24/23 0757 04/25/23 0222 04/26/23 0237  WBC 9.4   < > 6.0  --  6.8  --  6.4 6.2 5.1  NEUTROABS 7.9*  --  4.2  --  5.3  --  4.7  --   --   HGB 9.4*   < > 5.9*   < > 7.0* 7.5* 8.2* 7.3* 7.5*  HCT 28.5*   < > 18.1*   < > 21.0* 22.3* 24.2* 22.5* 22.8*  MCV 98.6   < > 100.6*  --  98.1  --  97.2 96.2 97.9  PLT 136*   < > 87*  --  90*  --  98* 121* 133*   < > = values in this interval not displayed.    Basic Metabolic Panel: Recent Labs  Lab 04/22/23 0658 04/23/23 0204 04/24/23 0510 04/25/23 0222 04/26/23 0237  NA 134* 133* 132* 129* 127*  K 4.3 4.2 4.0 4.0 4.0  CL 97* 98 97* 98 95*  CO2 24 23 26 22 22   GLUCOSE 133* 128* 128* 118* 104*  BUN 21 24* 24* 26* 22  CREATININE 1.22 1.40* 1.43* 1.24 1.06  CALCIUM 8.8* 8.2* 8.2* 8.2* 8.0*  MG 2.2  --   --   --   --   PHOS 4.7*  --   --   --   --     GFR: Estimated Creatinine Clearance: 52.7 mL/min (by C-G formula based on SCr of 1.06 mg/dL). Liver Function Tests: Recent Labs   Lab 04/21/23 2214  AST 19  ALT 12  ALKPHOS 54  BILITOT 1.1  PROT 5.9*  ALBUMIN 3.2*      Rhandi Despain   04/26/2023 11:25 AM

## 2023-04-26 NOTE — Progress Notes (Signed)
Physical Therapy Treatment Patient Details Name: Danny Brady MRN: [id number] DOB: [birth year] Today's Date: 04/26/2023   History of Present Illness Danny Brady is a 89 y.o. male who presented after a mechanical fall resulting in a R periprosthetic distal femur fx; s/p ORIF, WBAT; with medical history significant for hypertension, hyperlipidemia, HFrEF, CVA, paroxysmal A-fib not anticoagulated    PT Comments  Continuing work on functional mobility and activity tolerance;  Pt stood from elevated bed today with +2 heavy mod assist, and nice weight acceptance RLE in standing; attempted pivot steps, however pt with decr stance stability R, and we opted to sit to the bed and use the stedy to get more sit to stand practice and assist pt OOB; Patient will benefit from intensive inpatient follow up therapy, >3 hours/day     Assistance Recommended at Discharge Frequent or constant Supervision/Assistance  If plan is discharge home, recommend the following:  Can travel by private vehicle    A lot of help with walking and/or transfers;Two people to help with walking and/or transfers;Two people to help with bathing/dressing/bathroom;Assistance with cooking/housework;Assist for transportation;Help with stairs or ramp for entrance      Equipment Recommendations  Hospital bed;Wheelchair cushion (measurements PT);Other (comment) (may consider hoyer lift and ambulance transport home, if pt/family opt for no SNF)    Recommendations for Other Services       Precautions / Restrictions Precautions Precautions: Fall Precaution Comments: Very fragile skin Required Braces or Orthoses: Other Brace Other Brace: Has custom shoes for foot deformities and leg length discrepancy Restrictions RLE Weight Bearing: Weight bearing as tolerated     Mobility  Bed Mobility Overal bed mobility: Needs Assistance Bed Mobility: Supine to Sit     Supine to sit: +2 for physical assistance, Mod assist     General  bed mobility comments: Less assist needed to help RLE off of the EOB; Showing improved reach for R bedrail with LUE to pull to sit    Transfers Overall transfer level: Needs assistance Equipment used: Rollator (4 wheels), Ambulation equipment used (Pt's own Rollaotr tha twe kept locked) Transfers: Sit to/from Stand, Bed to chair/wheelchair/BSC Sit to Stand: Mod assist, +2 physical assistance   Step pivot transfers: Max assist, +2 physical assistance, +2 safety/equipment       General transfer comment: Good forward lean and push up to stand; attemtped step pivot, however R knee buckle, and we sat and opted to use the stedy    Ambulation/Gait                   Stairs             Wheelchair Mobility     Tilt Bed    Modified Rankin (Stroke Patients Only)       Balance     Sitting balance-Leahy Scale: Fair       Standing balance-Leahy Scale: Zero (approaching Poor)                              Cognition Arousal/Alertness: Awake/alert Behavior During Therapy: WFL for tasks assessed/performed Overall Cognitive Status: Within Functional Limits for tasks assessed                                 General Comments: Pleasant; tending to joke        Exercises      General Comments  General comments (skin integrity, edema, etc.): Most of session on Room Air, and sats got as low as 89%; re-started supplemental O2 at end of session      Pertinent Vitals/Pain Pain Assessment Pain Assessment: Faces Faces Pain Scale: Hurts little more Pain Location: RLE, espcially close to knee Pain Descriptors / Indicators: Grimacing Pain Intervention(s): Premedicated before session    Home Living                          Prior Function            PT Goals (current goals can now be found in the care plan section) Acute Rehab PT Goals Patient Stated Goal: agrees to OOB; doesn't want to hurt; doesn't want to go to SNF PT Goal  Formulation: With patient/family Time For Goal Achievement: 05/07/23 Potential to Achieve Goals: Good Progress towards PT goals: Progressing toward goals    Frequency    Min 5X/week      PT Plan Current plan remains appropriate    Co-evaluation              AM-PAC PT "6 Clicks" Mobility   Outcome Measure  Help needed turning from your back to your side while in a flat bed without using bedrails?: A Lot Help needed moving from lying on your back to sitting on the side of a flat bed without using bedrails?: A Lot Help needed moving to and from a bed to a chair (including a wheelchair)?: Total Help needed standing up from a chair using your arms (e.g., wheelchair or bedside chair)?: Total Help needed to walk in hospital room?: Total Help needed climbing 3-5 steps with a railing? : Total 6 Click Score: 8    End of Session Equipment Utilized During Treatment: Gait belt Activity Tolerance: Patient tolerated treatment well Patient left: in chair;with call bell/phone within reach;with family/visitor present;with chair alarm set Nurse Communication: Mobility status PT Visit Diagnosis: Unsteadiness on feet (R26.81);Other abnormalities of gait and mobility (R26.89);History of falling (Z91.81);Pain;Muscle weakness (generalized) (M62.81) Pain - Right/Left: Right Pain - part of body: Leg     Time: 0454-0981 PT Time Calculation (min) (ACUTE ONLY): 32 min  Charges:    $Therapeutic Activity: 23-37 mins PT General Charges $$ ACUTE PT VISIT: 1 Visit                     Van Clines, PT  Acute Rehabilitation Services Office [phone] Secure Chat welcomed    [name] 04/26/2023, 5:26 PM

## 2023-04-26 NOTE — Progress Notes (Signed)
Inpatient Rehab Admissions Coordinator:   Received a denial for CIR.  Per [facility] Medicare MD, pt either not complex enough, or too complex.  Dr. Rito Ehrlich and [facility] team aware. I will try to call pt's son to update him.  Will sign off for CIR.   Estill Dooms, PT, DPT Admissions Coordinator [phone] 04/26/23  2:41 PM

## 2023-04-26 NOTE — TOC Initial Note (Signed)
Transition of Care [facility]) - Initial/Assessment Note    Patient Details  Name: Danny Brady MRN: [id number] Date of Birth: 24-Apr-1927  Transition of Care [facility]) CM/SW Contact:    Lorri Frederick, LCSW Phone Number: 04/26/2023, 3:48 PM  Clinical Narrative:     CSW informed that CIR Berk[facility] Harvey has been denied.  Pt oriented x3, was able to participate in conversation.  Pt caregiver [name] also present in room.  CSW updated them on CIR, discussed SNF.  Pt from home with 24 hour caregivers.  Is open to SNF, not excited.  Was at [facility] before and will not return there.  Permission given to speak with sons Jonathon Jordan and daughter Amil Amen.  CSW asked to contact Arlys John to discuss DC plan.  CSW did speak to Arlys John, he wants to at least talk to his siblings about if it would be possible to bring pt directly home.  Discussed pt need +2 help currently.  Arlys John agreeable to start SNF process, does not want [facility] either due to poor experience during covid.    First choice would be [facility].    Medicare choice document provided to pt and [name].  Referral sent out in hub for SNF.      [facility] does not accept [employer].  Pt informed.          Expected Discharge Plan: Skilled Nursing Facility Barriers to Discharge: Continued Medical Work up, SNF Pending bed offer   Patient Goals and CMS Choice   CMS Medicare.gov Compare Post Acute Care list provided to:: Patient Choice offered to / list presented to : Patient      Expected Discharge Plan and Services In-house Referral: Clinical Social Work   Post Acute Care Choice: Skilled Nursing Facility Living arrangements for the past 2 months: Single Family Home                                      Prior Living Arrangements/Services Living arrangements for the past 2 months: Single Family Home Lives with:: Self Patient language and need for interpreter reviewed:: Yes Do you feel safe going back to the place where you live?: Yes      Need for  Family Participation in Patient Care: Yes (Comment) Care giver support system in place?: Yes (comment) Current home services: Other (comment) (pt has 24 hour caregivers in home) Criminal Activity/Legal Involvement Pertinent to Current Situation/Hospitalization: No - Comment as needed  Activities of Daily Living Home Assistive Devices/Equipment: Raised toilet seat with rails, Walker (specify type), Shower chair with back, Grab bars in shower, Wheelchair, Other (Comment) (standard walker and high arm walker, lift chair in living space) ADL Screening (condition at time of admission) Patient's cognitive ability adequate to safely complete daily activities?: Yes Is the patient deaf or have difficulty hearing?: No Does the patient have difficulty seeing, even when wearing glasses/contacts?: Yes Does the patient have difficulty concentrating, remembering, or making decisions?: No Patient able to express need for assistance with ADLs?: Yes Does the patient have difficulty dressing or bathing?: Yes Independently performs ADLs?: Yes (appropriate for developmental age) Does the patient have difficulty walking or climbing stairs?: Yes Weakness of Legs: Both Weakness of Arms/Hands: Both  Permission Sought/Granted Permission sought to share information with : Family Supports Permission granted to share information with : Yes, Verbal Permission Granted  Share Information with NAME: sons Jonathon Jordan, daughter Amil Amen  Permission granted  to share info w AGENCY: SNF        Emotional Assessment Appearance:: Appears stated age Attitude/Demeanor/Rapport: Engaged Affect (typically observed): Appropriate, Pleasant Orientation: : Oriented to Self, Oriented to Place, Oriented to Situation      Admission diagnosis:  Femur fracture, right (HCC) [S72.91XA] Patient Active Problem List   Diagnosis Date Noted   HFrEF (heart failure with reduced ejection fraction) (HCC) 04/24/2023   Closed displaced oblique  fracture of shaft of right femur (HCC) 04/22/2023   Femur fracture, right (HCC) 04/22/2023   Hypomagnesemia 12/15/2019   Thrombocytopenia (HCC) 12/15/2019   Elevated liver enzymes    Transaminitis 12/13/2019   Lactic acidosis 12/13/2019   History of fracture of right hip 12/04/2019   Closed right hip fracture (HCC) 11/20/2019   Hypertension    Chronic systolic CHF (congestive heart failure) (HCC)    GERD (gastroesophageal reflux disease)    Fall    Closed comminuted fracture of right hip (HCC)    Hypokalemia    Paroxysmal atrial fibrillation (HCC)    B12 deficiency 12/21/2016   Urinary incontinence, male, stress 09/23/2016   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) 05/02/2016   Benign prostatic hyperplasia with urinary obstruction 03/23/2016   Hyperlipidemia, mixed 03/23/2016   Urinary retention 02/23/2016   History of nonmelanoma skin cancer 08/18/2015   Incomplete emptying of bladder 07/29/2014   Bladder outlet obstruction 05/29/2013   Prostate cancer (HCC) 11/26/2012   PCP:  Danella Penton, MD Pharmacy:   [facility] - [place], [place] - [address] [place] [address] Phone: [phone] Fax: [phone]     Social Determinants of Health (SDOH) Social History: SDOH Screenings   Food Insecurity: No Food Insecurity (04/22/2023)  Housing: Low Risk  (04/22/2023)  Transportation Needs: No Transportation Needs (04/22/2023)  Utilities: Not At Risk (04/22/2023)  Tobacco Use: Low Risk  (04/22/2023)   SDOH Interventions:     Readmission Risk Interventions     No data to display

## 2023-04-26 NOTE — Progress Notes (Signed)
Inpatient Rehab Admissions Coordinator:   Provided insurance with updated PT note, per request, as well as peer to peer info if needed.  Will follow.  Note foley replaced last night and AKI due to urinary retention.  Hgb dropped to 7.3 today from 8.2 yesterday.  Sodium down to 129 from 132 Monday.  Feel that all of these in combination with his age should qualify him for CIR based on CMS guidelines.  Will follow.   Estill Dooms, PT, DPT Admissions Coordinator [phone] 04/26/23  10:48 AM

## 2023-04-27 DIAGNOSIS — K5900 Constipation, unspecified: Secondary | ICD-10-CM | POA: Diagnosis not present

## 2023-04-27 DIAGNOSIS — D649 Anemia, unspecified: Secondary | ICD-10-CM | POA: Diagnosis not present

## 2023-04-27 DIAGNOSIS — S7291XA Unspecified fracture of right femur, initial encounter for closed fracture: Secondary | ICD-10-CM | POA: Diagnosis not present

## 2023-04-27 DIAGNOSIS — E871 Hypo-osmolality and hyponatremia: Secondary | ICD-10-CM | POA: Diagnosis not present

## 2023-04-27 LAB — BASIC METABOLIC PANEL
Anion gap: 11 (ref 5–15)
BUN: 21 mg/dL (ref 8–23)
CO2: 21 mmol/L — ABNORMAL LOW (ref 22–32)
Calcium: 8.1 mg/dL — ABNORMAL LOW (ref 8.9–10.3)
Chloride: 97 mmol/L — ABNORMAL LOW (ref 98–111)
Creatinine, Ser: 1.02 mg/dL (ref 0.61–1.24)
GFR, Estimated: >60 mL/min (ref >60)
Glucose, Bld: 102 mg/dL — ABNORMAL HIGH (ref 70–99)
Potassium: 3.7 mmol/L (ref 3.5–5.1)
Sodium: 129 mmol/L — ABNORMAL LOW (ref 135–145)

## 2023-04-27 LAB — GLUCOSE, CAPILLARY
Glucose-Capillary: 108 mg/dL — ABNORMAL HIGH (ref 70–99)
Glucose-Capillary: 162 mg/dL — ABNORMAL HIGH (ref 70–99)
Glucose-Capillary: 117 mg/dL — ABNORMAL HIGH (ref 70–99)
Glucose-Capillary: 121 mg/dL — ABNORMAL HIGH (ref 70–99)

## 2023-04-27 LAB — CBC
HCT: 24.5 % — ABNORMAL LOW (ref 39.0–52.0)
Hemoglobin: 8 g/dL — ABNORMAL LOW (ref 13.0–17.0)
MCH: 32.1 pg (ref 26.0–34.0)
MCHC: 32.7 g/dL (ref 30.0–36.0)
MCV: 98.4 fL (ref 80.0–100.0)
Platelets: 160 10*3/uL (ref 150–400)
RBC: 2.49 MIL/uL — ABNORMAL LOW (ref 4.22–5.81)
RDW: 15.3 % (ref 11.5–15.5)
WBC: 4.7 10*3/uL (ref 4.0–10.5)
nRBC: 1.3 % — ABNORMAL HIGH (ref 0.0–0.2)

## 2023-04-27 MED ORDER — FLEET ENEMA 7-19 GM/118ML RE ENEM
1.0000 | ENEMA | Freq: Once | RECTAL | Status: DC
  Filled 2023-04-27: qty 1

## 2023-04-27 NOTE — Progress Notes (Signed)
PROGRESS NOTE   Danny OBRIANT  Brady:[id number] DOB: 01-13-1927 DOA: 04/22/2023 PCP: Danella Penton, MD   Brief Narrative:  Danny Brady is a 89 y.o. male with medical history significant for hypertension, hyperlipidemia, HFrEF, CVA, paroxysmal A-fib not anticoagulated who presented after a mechanical fall.  Uses a walker at baseline.  He fell after a turn with his walker.  He was in his usual state of health prior to this. In the ED he was found to have periprosthetic distal right femur fracture.  He was hospitalized for further management.  Underwent ORIF on 6/29.  Assessment and Plan:  Periprosthetic distal right femur fracture, post mechanical fall. S/p ORIF.  Seems to be stable for the most part.  Seen by PT and OT.  Initially inpatient rehabilitation was being considered.  However insurance has denied this.  SNF being pursued. Was on Lovenox but it was held due to drop in hemoglobin.  After transfusion hemoglobin has been stable.  Lovenox was resumed yesterday.  Continue to monitor closely. Orthopedics recommends aspirin 81 mg twice daily at discharge.     Chronic normocytic anemia/acute blood loss anemia Drop in hemoglobin was due to postoperative blood loss.  Hemoglobin dropped to 5.9.  He was transfused.  Stable.  Hyponatremia Urine osmolality 501.  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  Patient was also on furosemide prior to admission.  He seems to be fairly euvolemic.   Patient's furosemide was resumed.  He was started on salt tablets.  Sodium level has improved this morning.  Recheck tomorrow.    Severe constipation No bowel movement despite aggressive bowel regimen.  Will give enema today.   Hyperglycemia HbA1c 5.5.  Paroxysmal A-fib  Rate is controlled. Not on anticoagulation Monitor on telemetry   AKI due to urinary retention Renal function has improved after Foley catheter was placed.  Continue with finasteride.  Voiding trial once he is more ambulatory.   Will likely need to be discharged with Foley catheter.  Chronic HFrEF Fairly euvolemic.  Continue to monitor volume status.  Chronic anxiety/depression -Continue Zoloft   GERD -Continue PPI  DVT prophylaxis: Lovenox CODE STATUS: Full code Family communication: Son was updated yesterday Disposition: SNF   Subjective:  Still has not had any bowel movement.  No other complaints offered.  Denies any abdominal pain.  Passing flatus.  Objective:  Vitals:   04/27/23 0341 04/27/23 0500 04/27/23 0600 04/27/23 0714  BP: 102/75   128/75  Pulse: 68   74  Resp: 16   17  Temp: 97.7 F (36.5 C)   97.8 F (36.6 C)  TempSrc: Oral  Rectal Oral  SpO2: 100%   98%  Weight:  101.9 kg    Height:        General appearance: Awake alert.  In no distress Resp: Clear to auscultation bilaterally.  Normal effort Cardio: S1-S2 is normal regular.  No S3-S4.  No rubs murmurs or bruit GI: Abdomen is soft.  Nontender nondistended.  Bowel sounds are present normal.  No masses organomegaly Neurologic: Alert and oriented x3.  No focal neurological deficits.     Data Reviewed:   CBC: Recent Labs  Lab 04/21/23 2214 04/22/23 0658 04/23/23 0204 04/23/23 0804 04/23/23 1134 04/23/23 2020 04/24/23 0757 04/25/23 0222 04/26/23 0237 04/27/23 0306  WBC 9.4   < > 6.0  --  6.8  --  6.4 6.2 5.1 4.7  NEUTROABS 7.9*  --  4.2  --  5.3  --  4.7  --   --   --  HGB 9.4*   < > 5.9*   < > 7.0* 7.5* 8.2* 7.3* 7.5* 8.0*  HCT 28.5*   < > 18.1*   < > 21.0* 22.3* 24.2* 22.5* 22.8* 24.5*  MCV 98.6   < > 100.6*  --  98.1  --  97.2 96.2 97.9 98.4  PLT 136*   < > 87*  --  90*  --  98* 121* 133* 160   < > = values in this interval not displayed.    Basic Metabolic Panel: Recent Labs  Lab 04/22/23 0658 04/23/23 0204 04/24/23 0510 04/25/23 0222 04/26/23 0237 04/27/23 0306  NA 134* 133* 132* 129* 127* 129*  K 4.3 4.2 4.0 4.0 4.0 3.7  CL 97* 98 97* 98 95* 97*  CO2 24 23 26 22 22  21*  GLUCOSE 133* 128* 128*  118* 104* 102*  BUN 21 24* 24* 26* 22 21  CREATININE 1.22 1.40* 1.43* 1.24 1.06 1.02  CALCIUM 8.8* 8.2* 8.2* 8.2* 8.0* 8.1*  MG 2.2  --   --   --   --   --   PHOS 4.7*  --   --   --   --   --     GFR: Estimated Creatinine Clearance: 54.7 mL/min (by C-G formula based on SCr of 1.02 mg/dL).  Liver Function Tests: Recent Labs  Lab 04/21/23 2214  AST 19  ALT 12  ALKPHOS 54  BILITOT 1.1  PROT 5.9*  ALBUMIN 3.2*      Nya Monds   04/27/2023 11:09 AM

## 2023-04-27 NOTE — Plan of Care (Signed)
  Problem: Health Behavior/Discharge Planning: Goal: Ability to manage health-related needs will improve Outcome: Progressing   

## 2023-04-27 NOTE — Progress Notes (Signed)
Physical Therapy Treatment Patient Details Name: Danny Brady MRN: [id number] DOB: [birth year] Today's Date: 04/27/2023   History of Present Illness Danny Brady is a 89 y.o. male who presented after a mechanical fall resulting in a R periprosthetic distal femur fx; s/p ORIF, WBAT; with medical history significant for hypertension, hyperlipidemia, HFrEF, CVA, paroxysmal A-fib not anticoagulated    PT Comments  Pt was given miralax and prune juice this morning and has had 2 large BM this afternoon. Pt reporting increased fatigue and requesting not to get out of bed but willing to participate in bed level exercise. D/c plan remains appropriate at this time.     Assistance Recommended at Discharge Frequent or constant Supervision/Assistance  If plan is discharge home, recommend the following:  Can travel by private vehicle    A lot of help with walking and/or transfers;Two people to help with walking and/or transfers;Two people to help with bathing/dressing/bathroom;Assistance with cooking/housework;Assist for transportation;Help with stairs or ramp for entrance      Equipment Recommendations  Hospital bed;Wheelchair cushion (measurements PT);Other (comment) (may consider hoyer lift and ambulance transport home, if pt/family opt for no SNF)    Recommendations for Other Services       Precautions / Restrictions Precautions Precautions: Fall Precaution Comments: Very fragile skin Required Braces or Orthoses: Other Brace Other Brace: Has custom shoes for foot deformities and leg length discrepancy Restrictions Weight Bearing Restrictions: Yes RLE Weight Bearing: Weight bearing as tolerated     Mobility  Bed Mobility               General bed mobility comments: deferred as pt requesting not to get up after having frequent bowel movements this afternoon agreeable to bed level exercise                 Cognition Arousal/Alertness: Awake/alert Behavior During Therapy:  WFL for tasks assessed/performed Overall Cognitive Status: Within Functional Limits for tasks assessed                                 General Comments: Pleasant; tending to joke        Exercises Total Joint Exercises Ankle Circles/Pumps: AROM, Both, 10 reps Quad Sets: AROM, Both, 10 reps, Supine Gluteal Sets: AROM, Both, 10 reps, Supine Heel Slides: AAROM, Both, 10 reps, Supine Hip ABduction/ADduction: AAROM, Both, 10 reps, Supine Straight Leg Raises: AAROM, Both, 10 reps, Supine    General Comments General comments (skin integrity, edema, etc.): VSS on RA, pt caregiver in room during therapy      Pertinent Vitals/Pain Pain Assessment Pain Assessment: Faces Faces Pain Scale: Hurts little more Pain Location: RLE, espcially close to knee Pain Descriptors / Indicators: Grimacing     PT Goals (current goals can now be found in the care plan section) Acute Rehab PT Goals Patient Stated Goal: agrees to OOB; doesn't want to hurt; doesn't want to go to SNF PT Goal Formulation: With patient/family Time For Goal Achievement: 05/07/23 Potential to Achieve Goals: Good Progress towards PT goals: Progressing toward goals    Frequency    Min 5X/week      PT Plan Current plan remains appropriate       AM-PAC PT "6 Clicks" Mobility   Outcome Measure  Help needed turning from your back to your side while in a flat bed without using bedrails?: A Lot Help needed moving from lying on your back to sitting on  the side of a flat bed without using bedrails?: A Lot Help needed moving to and from a bed to a chair (including a wheelchair)?: Total Help needed standing up from a chair using your arms (e.g., wheelchair or bedside chair)?: Total Help needed to walk in hospital room?: Total Help needed climbing 3-5 steps with a railing? : Total 6 Click Score: 8    End of Session Equipment Utilized During Treatment: Gait belt Activity Tolerance: Patient tolerated treatment  well Patient left: in bed;with call bell/phone within reach;with bed alarm set Nurse Communication: Mobility status PT Visit Diagnosis: Unsteadiness on feet (R26.81);Other abnormalities of gait and mobility (R26.89);History of falling (Z91.81);Pain;Muscle weakness (generalized) (M62.81) Pain - Right/Left: Right Pain - part of body: Leg     Time: 1610-9604 PT Time Calculation (min) (ACUTE ONLY): 20 min  Charges:    $Therapeutic Exercise: 8-22 mins PT General Charges $$ ACUTE PT VISIT: 1 Visit                     Zayna Toste B. Beverely Risen PT, DPT Acute Rehabilitation Services Please use secure chat or  Call Office [phone]    Elon Alas Fleet 04/27/2023, 3:24 PM

## 2023-04-27 NOTE — TOC Progression Note (Signed)
Transition of Care [facility]) - Progression Note    Patient Details  Name: Danny Brady MRN: [id number] Date of Birth: 1927-05-06  Transition of [facility]) CM/SW Contact  Lorri Frederick, LCSW Phone Number: 04/27/2023, 10:16 AM  Clinical Narrative:   No bed offers yet.     Expected Discharge Plan: Skilled Nursing Facility Barriers to Discharge: Continued Medical Work up, SNF Pending bed offer  Expected Discharge Plan and Services In-house Referral: Clinical Social Work   Post Acute Care Choice: Skilled Nursing Facility Living arrangements for the past 2 months: Single Family Home                                       Social Determinants of Health (SDOH) Interventions SDOH Screenings   Food Insecurity: No Food Insecurity (04/22/2023)  Housing: Low Risk  (04/22/2023)  Transportation Needs: No Transportation Needs (04/22/2023)  Utilities: Not At Risk (04/22/2023)  Tobacco Use: Low Risk  (04/22/2023)    Readmission Risk Interventions     No data to display

## 2023-04-28 ENCOUNTER — Inpatient Hospital Stay (HOSPITAL_COMMUNITY): Payer: Medicare PPO

## 2023-04-28 ENCOUNTER — Encounter (HOSPITAL_COMMUNITY): Payer: Self-pay | Admitting: Orthopedic Surgery

## 2023-04-28 DIAGNOSIS — E871 Hypo-osmolality and hyponatremia: Secondary | ICD-10-CM | POA: Diagnosis not present

## 2023-04-28 DIAGNOSIS — S7291XA Unspecified fracture of right femur, initial encounter for closed fracture: Secondary | ICD-10-CM | POA: Diagnosis not present

## 2023-04-28 DIAGNOSIS — D649 Anemia, unspecified: Secondary | ICD-10-CM | POA: Diagnosis not present

## 2023-04-28 DIAGNOSIS — K5900 Constipation, unspecified: Secondary | ICD-10-CM | POA: Diagnosis not present

## 2023-04-28 LAB — URINALYSIS, ROUTINE W REFLEX MICROSCOPIC
Glucose, UA: NEGATIVE mg/dL
Ketones, ur: NEGATIVE mg/dL
Leukocytes,Ua: NEGATIVE
Nitrite: NEGATIVE
Protein, ur: NEGATIVE mg/dL
Specific Gravity, Urine: 1.025 (ref 1.005–1.030)
pH: 6 (ref 5.0–8.0)

## 2023-04-28 LAB — GLUCOSE, CAPILLARY
Glucose-Capillary: 114 mg/dL — ABNORMAL HIGH (ref 70–99)
Glucose-Capillary: 133 mg/dL — ABNORMAL HIGH (ref 70–99)
Glucose-Capillary: 121 mg/dL — ABNORMAL HIGH (ref 70–99)
Glucose-Capillary: 111 mg/dL — ABNORMAL HIGH (ref 70–99)

## 2023-04-28 LAB — BASIC METABOLIC PANEL
Anion gap: 6 (ref 5–15)
BUN: 16 mg/dL (ref 8–23)
CO2: 25 mmol/L (ref 22–32)
Calcium: 8.2 mg/dL — ABNORMAL LOW (ref 8.9–10.3)
Chloride: 98 mmol/L (ref 98–111)
Creatinine, Ser: 0.98 mg/dL (ref 0.61–1.24)
GFR, Estimated: >60 mL/min (ref >60)
Glucose, Bld: 115 mg/dL — ABNORMAL HIGH (ref 70–99)
Potassium: 3.9 mmol/L (ref 3.5–5.1)
Sodium: 129 mmol/L — ABNORMAL LOW (ref 135–145)

## 2023-04-28 LAB — CBC
HCT: 24.8 % — ABNORMAL LOW (ref 39.0–52.0)
Hemoglobin: 7.9 g/dL — ABNORMAL LOW (ref 13.0–17.0)
MCH: 31.7 pg (ref 26.0–34.0)
MCHC: 31.9 g/dL (ref 30.0–36.0)
MCV: 99.6 fL (ref 80.0–100.0)
Platelets: 144 10*3/uL — ABNORMAL LOW (ref 150–400)
RBC: 2.49 MIL/uL — ABNORMAL LOW (ref 4.22–5.81)
RDW: 15.6 % — ABNORMAL HIGH (ref 11.5–15.5)
WBC: 3.9 10*3/uL — ABNORMAL LOW (ref 4.0–10.5)
nRBC: 1.8 % — ABNORMAL HIGH (ref 0.0–0.2)

## 2023-04-28 LAB — URINALYSIS, MICROSCOPIC (REFLEX)
RBC / HPF: NONE SEEN RBC/hpf (ref 0–5)
WBC, UA: NONE SEEN WBC/hpf (ref 0–5)

## 2023-04-28 MED ORDER — GUAIFENESIN 100 MG/5ML PO LIQD
5.0000 mL | ORAL | Status: DC | PRN
  Administered 2023-04-28 – 2023-05-09 (×6): 5 mL via ORAL
  Filled 2023-04-28 (×8): qty 5

## 2023-04-28 NOTE — Progress Notes (Signed)
Occupational Therapy Treatment Patient Details Name: Danny Brady MRN: [id number] DOB: December 22, 1926 Today's Date: 04/28/2023   History of present illness Danny Brady is a 89 y.o. male who presented after a mechanical fall resulting in a R periprosthetic distal femur fx; s/p ORIF, WBAT; with medical history significant for hypertension, hyperlipidemia, HFrEF, CVA, paroxysmal A-fib not anticoagulated   OT comments  Pt in good spirits today, very conversational, motivated to participate and improve function. Nursing assisted with bed mobility and STS. Pt total A x2 for physical assistance for bed mobility, max A x2 for assistance standing at bedside with RW from elevated surface. Pt abel to stand up to 1-2 minutes multiple times, not fully erect, not able to take steps or pivot to chair today. Pt desat to 80's during functional activity, increased time for rest in between stands. Pt displays great motivation to participate, great candidate for therapy, DC plan still appropriate, would benefit from continued skilled therapy to maximize function.    Recommendations for follow up therapy are one component of a multi-disciplinary discharge planning process, led by the attending physician.  Recommendations may be updated based on patient status, additional functional criteria and insurance authorization.    Assistance Recommended at Discharge Frequent or constant Supervision/Assistance  Patient can return home with the following  Two people to help with walking and/or transfers;A lot of help with bathing/dressing/bathroom;Assistance with cooking/housework;Assist for transportation;Help with stairs or ramp for entrance;Direct supervision/assist for financial management;Direct supervision/assist for medications management   Equipment Recommendations  Other (comment) (defer)    Recommendations for Other Services      Precautions / Restrictions Precautions Precautions: Fall Precaution Comments: Very  fragile skin Required Braces or Orthoses: Other Brace Other Brace: Has custom shoes for foot deformities and leg length discrepancy Restrictions Weight Bearing Restrictions: Yes RLE Weight Bearing: Weight bearing as tolerated       Mobility Bed Mobility Overal bed mobility: Needs Assistance Bed Mobility: Supine to Sit, Sit to Supine     Supine to sit: Total assist, +2 for physical assistance Sit to supine: Total assist, +2 for physical assistance   General bed mobility comments: total, does not assist with bed mobility, x2 for physical assistance.    Transfers Overall transfer level: Needs assistance                 General transfer comment: attempted but not able to take steps to chair, not able to take side steps up the bed     Balance Overall balance assessment: Needs assistance Sitting-balance support: Feet supported Sitting balance-Leahy Scale: Fair Sitting balance - Comments: able to sit on EOB, limited ROM with reaching/leaning   Standing balance support: During functional activity, Reliant on assistive device for balance, Bilateral upper extremity supported Standing balance-Leahy Scale: Poor Standing balance comment: able to stand with RW at bedside, max A from elevated surface for STS, not able to stand fully erect                           ADL either performed or assessed with clinical judgement   ADL Overall ADL's : Needs assistance/impaired                                            Extremity/Trunk Assessment Upper Extremity Assessment Upper Extremity Assessment: RUE deficits/detail;LUE deficits/detail RUE Deficits /  Details: B RTC tears, pain with limited shoulder ROM, minimal ABD/flexion limiting reaching/overhead activities, able to self feed, needs help with grooming. RUE: Shoulder pain with ROM RUE Coordination: decreased gross motor LUE Deficits / Details: Son reports weakness and decr cooordination L side since  CVA, B RTC tears, pain with limited shoulder ROM, minimal ABD/flexion limiting reaching/overhead activities, able to self feed, needs help with grooming. LUE: Shoulder pain with ROM LUE Coordination: decreased gross motor            Vision       Perception     Praxis      Cognition Arousal/Alertness: Awake/alert Behavior During Therapy: WFL for tasks assessed/performed Overall Cognitive Status: Within Functional Limits for tasks assessed                                 General Comments: pleasant, likes to joke        Exercises      Shoulder Instructions       General Comments      Pertinent Vitals/ Pain       Pain Assessment Pain Assessment: 0-10 Pain Score: 4   Home Living                                          Prior Functioning/Environment              Frequency  Min 2X/week        Progress Toward Goals  OT Goals(current goals can now be found in the care plan section)  Progress towards OT goals: Progressing toward goals  Acute Rehab OT Goals Patient Stated Goal: to improve strength with standing and return to PLOF OT Goal Formulation: With patient/family Time For Goal Achievement: 05/08/23 Potential to Achieve Goals: Good ADL Goals Pt Will Perform Grooming: sitting;with set-up;with supervision Pt Will Transfer to Toilet: stand pivot transfer;with min assist;bedside commode Additional ADL Goal #1: Pt will independently use of breathing strategies to maintain SP02 above 93% during function tasks to demonstrate improved activity tolerance upon Physical exertion  Plan Discharge plan remains appropriate    Co-evaluation                 AM-PAC OT "6 Clicks" Daily Activity     Outcome Measure   Help from another person eating meals?: None Help from another person taking care of personal grooming?: A Little Help from another person toileting, which includes using toliet, bedpan, or urinal?: A Lot Help  from another person bathing (including washing, rinsing, drying)?: A Lot Help from another person to put on and taking off regular upper body clothing?: A Little Help from another person to put on and taking off regular lower body clothing?: Total 6 Click Score: 15    End of Session Equipment Utilized During Treatment: Gait belt;Rolling walker (2 wheels)  OT Visit Diagnosis: Unsteadiness on feet (R26.81);Other abnormalities of gait and mobility (R26.89);History of falling (Z91.81)   Activity Tolerance Patient tolerated treatment well   Patient Left in bed;with call bell/phone within reach;with bed alarm set;with family/visitor present;with nursing/sitter in room   Nurse Communication Mobility status        Time: 1640-1706 OT Time Calculation (min): 26 min  Charges: OT General Charges $OT Visit: 1 Visit OT Treatments $Therapeutic Activity: 23-37 mins  Atiyana Welte, OTR/L  Alexis Goodell 04/28/2023, 5:12 PM

## 2023-04-28 NOTE — Progress Notes (Signed)
PROGRESS NOTE   Danny Brady  ZOX:[id number] DOB: July 25, 1927 DOA: 04/22/2023 PCP: Danella Penton, MD   Brief Narrative:  Danny Brady is a 89 y.o. male with medical history significant for hypertension, hyperlipidemia, HFrEF, CVA, paroxysmal A-fib not anticoagulated who presented after a mechanical fall.  Uses a walker at baseline.  He fell after a turn with his walker.  He was in his usual state of health prior to this. In the ED he was found to have periprosthetic distal right femur fracture.  He was hospitalized for further management.  Underwent ORIF on 6/29.  Assessment and Plan:  Periprosthetic distal right femur fracture, post mechanical fall. S/p ORIF.  Seems to be stable for the most part.  Seen by PT and OT.  Initially inpatient rehabilitation was being considered.  However insurance has denied this.  SNF being pursued. Was on Lovenox but it was held due to drop in hemoglobin.  After transfusion hemoglobin has been stable.  Lovenox was resumed.  Continue to monitor closely. Orthopedics recommends aspirin 81 mg twice daily at discharge.   Stable for the most part.   Chronic normocytic anemia/acute blood loss anemia Drop in hemoglobin was due to postoperative blood loss.  Hemoglobin dropped to 5.9.  He was transfused.  Stable over the last 2 days.  Recheck labs tomorrow.  No overt bleeding noted.  Hyponatremia Urine osmolality 501.  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  Patient was also on furosemide prior to admission.  He seems to be fairly euvolemic.   Patient's furosemide was resumed.  He was started on salt tablets.  Sodium levels have improved.  Stable this morning.  Continue current treatment.    Severe constipation Despite aggressive bowel regimen he did not have a bowel movement.  Subsequently given an enema yesterday with good response.  Cough Patient mention cough since yesterday morning.  Noted to be afebrile.  Lungs are clear to auscultation on  examination.  Will order chest x-ray.   Hyperglycemia HbA1c 5.5.  Paroxysmal A-fib  Rate is controlled. Not on anticoagulation Monitor on telemetry   AKI due to urinary retention Renal function has improved after Foley catheter was placed.  Continue with finasteride.  Voiding trial once he is more ambulatory.  Will likely need to be discharged with Foley catheter.  Chronic HFrEF Fairly euvolemic.  Continue to monitor volume status.  Chronic anxiety/depression -Continue Zoloft Experiencing some sundowning occasionally.  Reorient daily.  No focal neurological deficits.  UA unremarkable.  GERD -Continue PPI  DVT prophylaxis: Lovenox CODE STATUS: Full code Family communication: Son was updated yesterday Disposition: SNF   Subjective: Had a bowel movement yesterday.  Complains of a cough since yesterday morning.  No shortness of breath.  Caregiver has noticed mild confusion as well.  Objective:  Vitals:   04/27/23 0714 04/27/23 1315 04/27/23 2019 04/28/23 0449  BP: 128/75 119/61 107/75 120/62  Pulse: 74 71 74 79  Resp: 17 18 17 19   Temp: 97.8 F (36.6 C) 98 F (36.7 C) 98.6 F (37 C) 99.4 F (37.4 C)  TempSrc: Oral Oral Oral Oral  SpO2: 98% 98% 98% 97%  Weight:      Height:        General appearance: Awake alert.  In no distress mildly distracted Resp: Clear to auscultation bilaterally.  Normal effort Cardio: S1-S2 is normal regular.  No S3-S4.  No rubs murmurs or bruit GI: Abdomen is soft.  Nontender nondistended.  Bowel sounds are present normal.  No masses organomegaly Extremities: No edema.  Full range of motion of lower extremities. Neurologic: He is alert.  He initially thought he was in [place] long.  But then was able to name [facility].  He knew the city.  No obvious focal neurological deficits.    Data Reviewed:   CBC: Recent Labs  Lab 04/21/23 2214 04/22/23 0658 04/23/23 0204 04/23/23 0804 04/23/23 1134 04/23/23 2020 04/24/23 0757  04/25/23 0222 04/26/23 0237 04/27/23 0306 04/28/23 0320  WBC 9.4   < > 6.0  --  6.8  --  6.4 6.2 5.1 4.7 3.9*  NEUTROABS 7.9*  --  4.2  --  5.3  --  4.7  --   --   --   --   HGB 9.4*   < > 5.9*   < > 7.0*   < > 8.2* 7.3* 7.5* 8.0* 7.9*  HCT 28.5*   < > 18.1*   < > 21.0*   < > 24.2* 22.5* 22.8* 24.5* 24.8*  MCV 98.6   < > 100.6*  --  98.1  --  97.2 96.2 97.9 98.4 99.6  PLT 136*   < > 87*  --  90*  --  98* 121* 133* 160 144*   < > = values in this interval not displayed.    Basic Metabolic Panel: Recent Labs  Lab 04/22/23 0658 04/23/23 0204 04/24/23 0510 04/25/23 0222 04/26/23 0237 04/27/23 0306 04/28/23 0320  NA 134*   < > 132* 129* 127* 129* 129*  K 4.3   < > 4.0 4.0 4.0 3.7 3.9  CL 97*   < > 97* 98 95* 97* 98  CO2 24   < > 26 22 22  21* 25  GLUCOSE 133*   < > 128* 118* 104* 102* 115*  BUN 21   < > 24* 26* 22 21 16   CREATININE 1.22   < > 1.43* 1.24 1.06 1.02 0.98  CALCIUM 8.8*   < > 8.2* 8.2* 8.0* 8.1* 8.2*  MG 2.2  --   --   --   --   --   --   PHOS 4.7*  --   --   --   --   --   --    < > = values in this interval not displayed.    GFR: Estimated Creatinine Clearance: 56.9 mL/min (by C-G formula based on SCr of 0.98 mg/dL).  Liver Function Tests: Recent Labs  Lab 04/21/23 2214  AST 19  ALT 12  ALKPHOS 54  BILITOT 1.1  PROT 5.9*  ALBUMIN 3.2*      Sieara Bremer   04/28/2023 10:25 AM

## 2023-04-28 NOTE — Progress Notes (Signed)
    Subjective: Patient reports pain as mild.  Tolerating diet.  Some difficulty urinating and constipation. Continues to mobilize with PT/OT.  No CP, SOB, fevers, N/V, abdominal pain.  Pending DC to SNF.  Low Hgb requiring blood transfusions on 6/30.    Hgb trending around 7-8.  Per medicine, Lovenox resumed 2 days ago.  Objective:   VITALS:   Vitals:   04/27/23 0714 04/27/23 1315 04/27/23 2019 04/28/23 0449  BP: 128/75 119/61 107/75 120/62  Pulse: 74 71 74 79  Resp: 17 18 17 19   Temp: 97.8 F (36.6 C) 98 F (36.7 C) 98.6 F (37 C) 99.4 F (37.4 C)  TempSrc: Oral Oral Oral Oral  SpO2: 98% 98% 98% 97%  Weight:      Height:          Latest Ref Rng & Units 04/28/2023    3:20 AM 04/27/2023    3:06 AM 04/26/2023    2:37 AM  CBC  WBC 4.0 - 10.5 K/uL 3.9  4.7  5.1   Hemoglobin 13.0 - 17.0 g/dL 7.9  8.0  7.5   Hematocrit 39.0 - 52.0 % 24.8  24.5  22.8   Platelets 150 - 400 K/uL 144  160  133       Latest Ref Rng & Units 04/28/2023    3:20 AM 04/27/2023    3:06 AM 04/26/2023    2:37 AM  BMP  Glucose 70 - 99 mg/dL 865  784  696   BUN 8 - 23 mg/dL 16  21  22    Creatinine 0.61 - 1.24 mg/dL 2.95  2.84  1.32   Sodium 135 - 145 mmol/L 129  129  127   Potassium 3.5 - 5.1 mmol/L 3.9  3.7  4.0   Chloride 98 - 111 mmol/L 98  97  95   CO2 22 - 32 mmol/L 25  21  22    Calcium 8.9 - 10.3 mg/dL 8.2  8.1  8.0    Intake/Output      07/04 0701 07/05 0700   P.O. 200   Total Intake(mL/kg) 200 (2)   Urine (mL/kg/hr) 1400 (0.6)   Stool 0   Total Output 1400   Net -1200       Stool Occurrence 2 x      Physical Exam: General: NAD.  Sititng up in bedside chair, calm, comfortable Resp: No increased wob Cardio: regular rate and rhythm ABD soft Neurologically intact MSK Neurovascularly intact Sensation intact distally Intact pulses distally Dorsiflexion/Plantar flexion intact Incision: dressing C/D/I   Assessment: 6 Days Post-Op   S/P Procedure(s) (LRB): OPEN REDUCTION INTERNAL  FIXATION (ORIF) PERIPROSTHETIC FRACTURE (Right)  Principal Problem:   Femur fracture, right (HCC) Active Problems:   Paroxysmal atrial fibrillation (HCC)   HFrEF (heart failure with reduced ejection fraction) (HCC)   Plan:  Advance diet Up with therapy Incentive Spirometry Elevate and Apply ice  Post op recs: WB: WBAT RLE Abx: ancef x23 hours post op Imaging: PACU xrays Dressing: keep intact until follow up, change PRN if soiled or saturated. DVT prophylaxis: lovenox in house, dc on aspirin 81mg  BID Follow up: 2 weeks after surgery for a wound check with Dr. Blan[facility] D[facility]ert at [facility].  DC: anticipate potential SNF placement Address: [address], [place], [place] [address]  Office Phone: [phone]   Cecil Cobbs, [place] Office [phone] 04/28/2023, 6:57 AM

## 2023-04-28 NOTE — TOC Progression Note (Addendum)
Transition of Care [facility]) - Progression Note    Patient Details  Name: Danny Brady MRN: [id number] Date of Birth: 09-17-1927  [facility] CM/SW Contact  Lorri Frederick, LCSW Phone Number: 04/28/2023, 8:56 AM  Clinical Narrative:   CSW spoke with pt and his caregivers about bed offers, was directed to son Danny Brady.  Spoke with Danny Brady by phone, wants to accept offer at [facility].    Awaiting confirmation that [facility] can receive pt today.  Auth request submitted in [place].   0930: TC [facility].  Cannot receive pt until Monday.    Expected Discharge Plan: Skilled Nursing Facility Barriers to Discharge: Continued Medical Work up, SNF Pending bed offer  Expected Discharge Plan and Services In-house Referral: Clinical Social Work   Post Acute Care Choice: Skilled Nursing Facility Living arrangements for the past 2 months: Single Family Home                                       Social Determinants of Health (SDOH) Interventions SDOH Screenings   Food Insecurity: No Food Insecurity (04/22/2023)  Housing: Low Risk  (04/22/2023)  Transportation Needs: No Transportation Needs (04/22/2023)  Utilities: Not At Risk (04/22/2023)  Tobacco Use: Low Risk  (04/22/2023)    Readmission Risk Interventions     No data to display

## 2023-04-29 DIAGNOSIS — S7291XA Unspecified fracture of right femur, initial encounter for closed fracture: Secondary | ICD-10-CM | POA: Diagnosis not present

## 2023-04-29 DIAGNOSIS — E871 Hypo-osmolality and hyponatremia: Secondary | ICD-10-CM | POA: Diagnosis not present

## 2023-04-29 DIAGNOSIS — K5900 Constipation, unspecified: Secondary | ICD-10-CM | POA: Diagnosis not present

## 2023-04-29 DIAGNOSIS — D649 Anemia, unspecified: Secondary | ICD-10-CM | POA: Diagnosis not present

## 2023-04-29 LAB — GLUCOSE, CAPILLARY
Glucose-Capillary: 102 mg/dL — ABNORMAL HIGH (ref 70–99)
Glucose-Capillary: 109 mg/dL — ABNORMAL HIGH (ref 70–99)
Glucose-Capillary: 119 mg/dL — ABNORMAL HIGH (ref 70–99)
Glucose-Capillary: 142 mg/dL — ABNORMAL HIGH (ref 70–99)

## 2023-04-29 LAB — SARS CORONAVIRUS 2 BY RT PCR: SARS Coronavirus 2 by RT PCR: POSITIVE — AB

## 2023-04-29 NOTE — Plan of Care (Signed)

## 2023-04-29 NOTE — Progress Notes (Signed)
PROGRESS NOTE   Danny Brady  ZOX:[id number] DOB: Jul 11, 1927 DOA: 04/22/2023 PCP: Danella Penton, MD   Brief Narrative:  Danny Brady is a 89 y.o. male with medical history significant for hypertension, hyperlipidemia, HFrEF, CVA, paroxysmal A-fib not anticoagulated who presented after a mechanical fall.  Uses a walker at baseline.  He fell after a turn with his walker.  He was in his usual state of health prior to this. In the ED he was found to have periprosthetic distal right femur fracture.  He was hospitalized for further management.  Underwent ORIF on 6/29.  Assessment and Plan:  Periprosthetic distal right femur fracture, post mechanical fall. S/p ORIF.  Seems to be stable for the most part.  Seen by PT and OT.  Initially inpatient rehabilitation was being considered.  However insurance has denied this.  SNF being pursued. Was on Lovenox but it was held due to drop in hemoglobin.  After transfusion hemoglobin has been stable.  Lovenox was resumed.  Continue to monitor closely. Orthopedics recommends aspirin 81 mg twice daily at discharge.   Stable for the most part.   Chronic normocytic anemia/acute blood loss anemia Drop in hemoglobin was due to postoperative blood loss.  Hemoglobin dropped to 5.9.  He was transfused.  Hemoglobin has been stable.  Will recheck labs tomorrow.  Hyponatremia Urine osmolality 501.  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  Patient was also on furosemide prior to admission.  He seems to be fairly euvolemic.   Patient's furosemide was resumed.  He was started on salt tablets.  Sodium levels have improved.    Severe constipation Constipation resolved after he was given enema.  Continue bowel regimen.  Cough Chest x-ray without any acute findings.  Lungs are clear to auscultation.  Patient given antitussive agents with improvement.     Hyperglycemia HbA1c 5.5.  Paroxysmal A-fib  Rate is controlled. Not on anticoagulation Monitor on  telemetry   AKI due to urinary retention Renal function has improved after Foley catheter was placed.  Continue with finasteride.  Voiding trial once he is more ambulatory.  Will need to be discharged with Foley catheter.  Chronic HFrEF Fairly euvolemic.  Continue to monitor volume status.  Chronic anxiety/depression -Continue Zoloft Experiencing some sundowning occasionally.  Reorient daily.  No focal neurological deficits.  UA unremarkable.  GERD -Continue PPI  DVT prophylaxis: Lovenox CODE STATUS: Full code Family communication: Son being updated periodically Disposition: SNF   Subjective: Denies any complaints this morning.  Mentions that cough is resolved.  Denies any shortness of breath.  Objective:  Vitals:   04/28/23 1155 04/28/23 1957 04/29/23 0409 04/29/23 0724  BP: 123/85 (!) 114/52 (!) 148/130 115/61  Pulse: 70 79 78 75  Resp:  17 16 18   Temp:  98.2 F (36.8 C)  98 F (36.7 C)  TempSrc:  Oral    SpO2:  97% 91% 95%  Weight:      Height:        General appearance: Awake alert.  In no distress Resp: Clear to auscultation bilaterally.  Normal effort Cardio: S1-S2 is normal regular.  No S3-S4.  No rubs murmurs or bruit GI: Abdomen is soft.  Nontender nondistended.  Bowel sounds are present normal.  No masses organomegaly Extremities: No edema.      Data Reviewed:   CBC: Recent Labs  Lab 04/23/23 0204 04/23/23 0804 04/23/23 1134 04/23/23 2020 04/24/23 0757 04/25/23 0222 04/26/23 0237 04/27/23 0306 04/28/23 0320  WBC 6.0  --  6.8  --  6.4 6.2 5.1 4.7 3.9*  NEUTROABS 4.2  --  5.3  --  4.7  --   --   --   --   HGB 5.9*   < > 7.0*   < > 8.2* 7.3* 7.5* 8.0* 7.9*  HCT 18.1*   < > 21.0*   < > 24.2* 22.5* 22.8* 24.5* 24.8*  MCV 100.6*  --  98.1  --  97.2 96.2 97.9 98.4 99.6  PLT 87*  --  90*  --  98* 121* 133* 160 144*   < > = values in this interval not displayed.    Basic Metabolic Panel: Recent Labs  Lab 04/24/23 0510 04/25/23 0222  04/26/23 0237 04/27/23 0306 04/28/23 0320  NA 132* 129* 127* 129* 129*  K 4.0 4.0 4.0 3.7 3.9  CL 97* 98 95* 97* 98  CO2 26 22 22  21* 25  GLUCOSE 128* 118* 104* 102* 115*  BUN 24* 26* 22 21 16   CREATININE 1.43* 1.24 1.06 1.02 0.98  CALCIUM 8.2* 8.2* 8.0* 8.1* 8.2*    GFR: Estimated Creatinine Clearance: 56.9 mL/min (by C-G formula based on SCr of 0.98 mg/dL).   Danny Brady   04/29/2023 10:36 AM

## 2023-04-30 DIAGNOSIS — S7291XA Unspecified fracture of right femur, initial encounter for closed fracture: Secondary | ICD-10-CM | POA: Diagnosis not present

## 2023-04-30 DIAGNOSIS — E871 Hypo-osmolality and hyponatremia: Secondary | ICD-10-CM | POA: Diagnosis not present

## 2023-04-30 DIAGNOSIS — D649 Anemia, unspecified: Secondary | ICD-10-CM | POA: Diagnosis not present

## 2023-04-30 DIAGNOSIS — K5900 Constipation, unspecified: Secondary | ICD-10-CM | POA: Diagnosis not present

## 2023-04-30 LAB — BASIC METABOLIC PANEL
Anion gap: 8 (ref 5–15)
BUN: 15 mg/dL (ref 8–23)
CO2: 24 mmol/L (ref 22–32)
Calcium: 7.8 mg/dL — ABNORMAL LOW (ref 8.9–10.3)
Chloride: 99 mmol/L (ref 98–111)
Creatinine, Ser: 1.03 mg/dL (ref 0.61–1.24)
GFR, Estimated: >60 mL/min (ref >60)
Glucose, Bld: 100 mg/dL — ABNORMAL HIGH (ref 70–99)
Potassium: 3.4 mmol/L — ABNORMAL LOW (ref 3.5–5.1)
Sodium: 131 mmol/L — ABNORMAL LOW (ref 135–145)

## 2023-04-30 LAB — GLUCOSE, CAPILLARY
Glucose-Capillary: 108 mg/dL — ABNORMAL HIGH (ref 70–99)
Glucose-Capillary: 118 mg/dL — ABNORMAL HIGH (ref 70–99)
Glucose-Capillary: 131 mg/dL — ABNORMAL HIGH (ref 70–99)
Glucose-Capillary: 119 mg/dL — ABNORMAL HIGH (ref 70–99)

## 2023-04-30 LAB — CBC
HCT: 22.5 % — ABNORMAL LOW (ref 39.0–52.0)
Hemoglobin: 7.1 g/dL — ABNORMAL LOW (ref 13.0–17.0)
MCH: 31 pg (ref 26.0–34.0)
MCHC: 31.6 g/dL (ref 30.0–36.0)
MCV: 98.3 fL (ref 80.0–100.0)
Platelets: 157 10*3/uL (ref 150–400)
RBC: 2.29 MIL/uL — ABNORMAL LOW (ref 4.22–5.81)
RDW: 16.4 % — ABNORMAL HIGH (ref 11.5–15.5)
WBC: 2.7 10*3/uL — ABNORMAL LOW (ref 4.0–10.5)
nRBC: 0 % (ref 0.0–0.2)

## 2023-04-30 LAB — HEMOGLOBIN AND HEMATOCRIT, BLOOD
HCT: 30.1 % — ABNORMAL LOW (ref 39.0–52.0)
Hemoglobin: 9.8 g/dL — ABNORMAL LOW (ref 13.0–17.0)

## 2023-04-30 LAB — TYPE AND SCREEN
ABO/RH(D): A POS
Antibody Screen: NEGATIVE
Unit division: 0

## 2023-04-30 LAB — BPAM RBC: Blood Product Expiration Date: 202407132359

## 2023-04-30 LAB — PREPARE RBC (CROSSMATCH)

## 2023-04-30 MED ORDER — POTASSIUM CHLORIDE CRYS ER 20 MEQ PO TBCR
40.0000 meq | EXTENDED_RELEASE_TABLET | Freq: Once | ORAL | Status: AC
  Administered 2023-04-30: 40 meq via ORAL
  Filled 2023-04-30: qty 2

## 2023-04-30 MED ORDER — SODIUM CHLORIDE 0.9% IV SOLUTION: Freq: Once | INTRAVENOUS | Status: AC

## 2023-04-30 MED ORDER — SENNOSIDES-DOCUSATE SODIUM 8.6-50 MG PO TABS
2.0000 | ORAL_TABLET | Freq: Every day | ORAL | Status: DC
  Administered 2023-05-01 – 2023-05-09 (×3): 2 via ORAL
  Filled 2023-04-30 (×6): qty 2

## 2023-04-30 MED ORDER — POLYETHYLENE GLYCOL 3350 17 G PO PACK
17.0000 g | PACK | Freq: Every day | ORAL | Status: DC
  Administered 2023-05-01 – 2023-05-06 (×5): 17 g via ORAL
  Filled 2023-04-30 (×9): qty 1

## 2023-04-30 NOTE — Plan of Care (Signed)
  Problem: Clinical Measurements: Goal: Respiratory complications will improve Outcome: Not Progressing   Problem: Elimination: Goal: Will not experience complications related to urinary retention Outcome: Not Progressing   Problem: Safety: Goal: Ability to remain free from injury will improve Outcome: Not Progressing

## 2023-04-30 NOTE — Progress Notes (Signed)
PROGRESS NOTE   Danny Brady  NGE:[id number] DOB: [birth year] DOA: 04/22/2023 PCP: Danella Penton, MD   Brief Narrative:  Danny Brady is a 89 y.o. male with medical history significant for hypertension, hyperlipidemia, HFrEF, CVA, paroxysmal A-fib not anticoagulated who presented after a mechanical fall.  Uses a walker at baseline.  He fell after a turn with his walker.  He was in his usual state of health prior to this. In the ED he was found to have periprosthetic distal right femur fracture.  He was hospitalized for further management.  Underwent ORIF on 6/29.  Assessment and Plan:  COVID-19 infection Patient did have a cough 3 days ago but never had fever.  Chest x-ray did not show any opacities.  It became apparent yesterday that one of his caregivers over spending time with in the hospital tested positive for COVID.  Patient was tested yesterday and noted to be positive.  He has very minimal respiratory symptoms.  Not noted to be hypoxic.  No indication to initiate any kind of treatment at this time.  Son's questions were answered.  Periprosthetic distal right femur fracture, post mechanical fall. S/p ORIF.  Seems to be stable for the most part.  Seen by PT and OT.  Initially inpatient rehabilitation was being considered.  However insurance has denied this.  SNF being pursued. Orthopedics recommends aspirin 81 mg twice daily at discharge.   Stable for the most part.   Chronic normocytic anemia/acute blood loss anemia Drop in hemoglobin was due to postoperative blood loss.  Hemoglobin dropped to 5.9.  He was transfused.  Hemoglobin has been stable but has drifted down in the last 48 hours.  Will transfuse additional unit of PRBC.  No overt bleeding noted.  Check labs tomorrow after transfusion.    Hyponatremia Urine osmolality 501.  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  Patient was also on furosemide prior to admission.  He seems to be fairly euvolemic.   Patient's furosemide  was resumed.  He was started on salt tablets.  Sodium level improved from 1 27-1 31 this morning.  Hypokalemia Will be repleted  Severe constipation Constipation resolved after he was given enema.  Continue bowel regimen.  Changed to once daily.  Hyperglycemia HbA1c 5.5.  Paroxysmal A-fib  Rate is controlled. Not on anticoagulation Monitor on telemetry   AKI due to urinary retention Renal function has improved after Foley catheter was placed.  Continue with finasteride.  Voiding trial once he is more ambulatory.  Will need to be discharged with Foley catheter.  Chronic HFrEF Fairly euvolemic.  Continue to monitor volume status.  Chronic anxiety/depression Continue Zoloft. Experiencing some sundowning occasionally.  Reorient daily.  No focal neurological deficits.  UA unremarkable.  GERD Continue PPI  DVT prophylaxis: Lovenox CODE STATUS: Full code Family communication: Son being updated periodically Disposition: Should be ready for SNF by tomorrow if hemoglobin is stable.  His positive COVID-19 status may delay discharge.  TOC to follow.   Subjective: Patient denies any complaints.  Occasional cough is present.  No nausea vomiting.  No shortness of breath.  Objective:  Vitals:   04/29/23 0724 04/29/23 2035 04/30/23 0500 04/30/23 0604  BP: 115/61 126/67  120/70  Pulse: 75 68  65  Resp: 18 18  18   Temp: 98 F (36.7 C) 98.8 F (37.1 C)  97.7 F (36.5 C)  TempSrc:  Oral  Oral  SpO2: 95% 97%  98%  Weight:   101.6 kg  Height:        General appearance: Awake alert.  In no distress Resp: Clear to auscultation bilaterally.  Normal effort Cardio: S1-S2 is normal regular.  No S3-S4.  No rubs murmurs or bruit GI: Abdomen is soft.  Nontender nondistended.  Bowel sounds are present normal.  No masses organomegaly Extremities: No edema.     Data Reviewed:   CBC: Recent Labs  Lab 04/23/23 1134 04/23/23 2020 04/24/23 0757 04/25/23 0222 04/26/23 0237  04/27/23 0306 04/28/23 0320 04/30/23 0328  WBC 6.8  --  6.4 6.2 5.1 4.7 3.9* 2.7*  NEUTROABS 5.3  --  4.7  --   --   --   --   --   HGB 7.0*   < > 8.2* 7.3* 7.5* 8.0* 7.9* 7.1*  HCT 21.0*   < > 24.2* 22.5* 22.8* 24.5* 24.8* 22.5*  MCV 98.1  --  97.2 96.2 97.9 98.4 99.6 98.3  PLT 90*  --  98* 121* 133* 160 144* 157   < > = values in this interval not displayed.    Basic Metabolic Panel: Recent Labs  Lab 04/25/23 0222 04/26/23 0237 04/27/23 0306 04/28/23 0320 04/30/23 0328  NA 129* 127* 129* 129* 131*  K 4.0 4.0 3.7 3.9 3.4*  CL 98 95* 97* 98 99  CO2 22 22 21* 25 24  GLUCOSE 118* 104* 102* 115* 100*  BUN 26* 22 21 16 15   CREATININE 1.24 1.06 1.02 0.98 1.03  CALCIUM 8.2* 8.0* 8.1* 8.2* 7.8*    GFR: Estimated Creatinine Clearance: 54.1 mL/min (by C-G formula based on SCr of 1.03 mg/dL).   Danny Brady   04/30/2023 9:32 AM

## 2023-04-30 NOTE — Progress Notes (Signed)
Patient transferred from bed to chair. 2+ assist with steady. Patient tolerated chair for several hours. Family requesting that staff get patient back into the chair tomorrow late morning/early afternoon

## 2023-05-01 DIAGNOSIS — K5900 Constipation, unspecified: Secondary | ICD-10-CM | POA: Diagnosis not present

## 2023-05-01 DIAGNOSIS — D649 Anemia, unspecified: Secondary | ICD-10-CM | POA: Diagnosis not present

## 2023-05-01 DIAGNOSIS — E871 Hypo-osmolality and hyponatremia: Secondary | ICD-10-CM | POA: Diagnosis not present

## 2023-05-01 DIAGNOSIS — S7291XA Unspecified fracture of right femur, initial encounter for closed fracture: Secondary | ICD-10-CM | POA: Diagnosis not present

## 2023-05-01 LAB — BASIC METABOLIC PANEL
Anion gap: 7 (ref 5–15)
BUN: 13 mg/dL (ref 8–23)
CO2: 24 mmol/L (ref 22–32)
Calcium: 7.8 mg/dL — ABNORMAL LOW (ref 8.9–10.3)
Chloride: 100 mmol/L (ref 98–111)
Creatinine, Ser: 0.86 mg/dL (ref 0.61–1.24)
GFR, Estimated: >60 mL/min (ref >60)
Glucose, Bld: 105 mg/dL — ABNORMAL HIGH (ref 70–99)
Potassium: 3.5 mmol/L (ref 3.5–5.1)
Sodium: 131 mmol/L — ABNORMAL LOW (ref 135–145)

## 2023-05-01 LAB — BPAM RBC
ISSUE DATE / TIME: 202407071130
Unit Type and Rh: 6200

## 2023-05-01 LAB — CBC
HCT: 26.6 % — ABNORMAL LOW (ref 39.0–52.0)
Hemoglobin: 8.7 g/dL — ABNORMAL LOW (ref 13.0–17.0)
MCH: 31.4 pg (ref 26.0–34.0)
MCHC: 32.7 g/dL (ref 30.0–36.0)
MCV: 96 fL (ref 80.0–100.0)
Platelets: 166 10*3/uL (ref 150–400)
RBC: 2.77 MIL/uL — ABNORMAL LOW (ref 4.22–5.81)
RDW: 18.9 % — ABNORMAL HIGH (ref 11.5–15.5)
WBC: 3.4 10*3/uL — ABNORMAL LOW (ref 4.0–10.5)
nRBC: 0 % (ref 0.0–0.2)

## 2023-05-01 LAB — TYPE AND SCREEN

## 2023-05-01 LAB — GLUCOSE, CAPILLARY
Glucose-Capillary: 90 mg/dL (ref 70–99)
Glucose-Capillary: 121 mg/dL — ABNORMAL HIGH (ref 70–99)
Glucose-Capillary: 121 mg/dL — ABNORMAL HIGH (ref 70–99)
Glucose-Capillary: 168 mg/dL — ABNORMAL HIGH (ref 70–99)

## 2023-05-01 LAB — MAGNESIUM: Magnesium: 1.7 mg/dL (ref 1.7–2.4)

## 2023-05-01 MED ORDER — POTASSIUM CHLORIDE CRYS ER 20 MEQ PO TBCR
40.0000 meq | EXTENDED_RELEASE_TABLET | Freq: Two times a day (BID) | ORAL | Status: AC
  Administered 2023-05-01 (×2): 40 meq via ORAL
  Filled 2023-05-01 (×2): qty 2

## 2023-05-01 MED ORDER — MAGNESIUM SULFATE 2 GM/50ML IV SOLN
2.0000 g | Freq: Once | INTRAVENOUS | Status: AC
  Administered 2023-05-01: 2 g via INTRAVENOUS
  Filled 2023-05-01: qty 50

## 2023-05-01 NOTE — Plan of Care (Signed)
  Problem: Safety: Goal: Ability to remain free from injury will improve Outcome: Not Progressing   Problem: Skin Integrity: Goal: Risk for impaired skin integrity will decrease Outcome: Not Progressing   

## 2023-05-01 NOTE — Progress Notes (Signed)
7/8 IMM Letter given to patient by Tech. I observed the letter being given.

## 2023-05-01 NOTE — Progress Notes (Signed)
Occupational Therapy Treatment Patient Details Name: Danny Brady MRN: [id number] DOB: 1927-01-06 Today's Date: 05/01/2023   History of present illness Danny Brady is a 89 y.o. male who presented after a mechanical fall resulting in a R periprosthetic distal femur fx; s/p ORIF, WBAT; with medical history significant for hypertension, hyperlipidemia, HFrEF, CVA, paroxysmal A-fib not anticoagulated   OT comments  Patient with fair progress toward patient focused goals.  Pain to his left leg and generalized weakness are the deficits.  Currently he is needing Min A of 2 for sit to stan, and closer to Mod A of two to take steps to recliner.  He is still requiring Max A for ADL completion, and would benefit from hip kit training.  OT will continue efforts in the acute setting, and Patient will benefit from continued inpatient follow up therapy, <3 hours/day     Recommendations for follow up therapy are one component of a multi-disciplinary discharge planning process, led by the attending physician.  Recommendations may be updated based on patient status, additional functional criteria and insurance authorization.    Assistance Recommended at Discharge Frequent or constant Supervision/Assistance  Patient can return home with the following  Two people to help with walking and/or transfers;A lot of help with bathing/dressing/bathroom;Assistance with cooking/housework;Assist for transportation;Help with stairs or ramp for entrance;Direct supervision/assist for financial management;Direct supervision/assist for medications management   Equipment Recommendations  None recommended by OT    Recommendations for Other Services      Precautions / Restrictions Precautions Precautions: Fall Precaution Comments: Very fragile skin Required Braces or Orthoses: Other Brace Other Brace: Has custom shoes for foot deformities and leg length discrepancy Restrictions Weight Bearing Restrictions: Yes RLE  Weight Bearing: Weight bearing as tolerated       Mobility Bed Mobility Overal bed mobility: Needs Assistance Bed Mobility: Supine to Sit     Supine to sit: Mod assist, +2 for physical assistance, Min assist          Transfers Overall transfer level: Needs assistance Equipment used: Rollator (4 wheels) Transfers: Sit to/from Stand, Bed to chair/wheelchair/BSC Sit to Stand: Min assist, +2 physical assistance Stand pivot transfers: Mod assist, +2 physical assistance   Step pivot transfers: +2 physical assistance, Mod assist           Balance Overall balance assessment: Needs assistance Sitting-balance support: Feet supported Sitting balance-Leahy Scale: Fair   Postural control: Posterior lean Standing balance support: Reliant on assistive device for balance Standing balance-Leahy Scale: Poor                             ADL either performed or assessed with clinical judgement   ADL       Grooming: Sitting;Min guard           Upper Body Dressing : Sitting;Minimal assistance   Lower Body Dressing: Maximal assistance;Sitting/lateral leans;Sit to/from stand;+2 for safety/equipment;+2 for physical assistance   Toilet Transfer: Moderate assistance;Stand-pivot;BSC/3in1;+2 for physical assistance   Toileting- Clothing Manipulation and Hygiene: Total assistance;Sit to/from stand              Extremity/Trunk Assessment Upper Extremity Assessment RUE Deficits / Details: B RTC tears, pain with limited shoulder ROM, minimal ABD/flexion limiting reaching/overhead activities, able to self feed, needs help with grooming. RUE Coordination: decreased gross motor LUE Deficits / Details: Son reports weakness and decr cooordination L side since CVA, B RTC tears, pain with limited shoulder ROM, minimal  ABD/flexion limiting reaching/overhead activities, able to self feed, needs help with grooming. LUE Coordination: decreased gross motor   Lower Extremity  Assessment Lower Extremity Assessment: Defer to PT evaluation   Cervical / Trunk Assessment Cervical / Trunk Assessment: Kyphotic    Vision Baseline Vision/History: 1 Wears glasses Patient Visual Report: No change from baseline     Perception Perception Perception: Not tested   Praxis Praxis Praxis: Not tested    Cognition Arousal/Alertness: Awake/alert Behavior During Therapy: WFL for tasks assessed/performed Overall Cognitive Status: Within Functional Limits for tasks assessed                                                       General Comments  VSS on RA    Pertinent Vitals/ Pain       Pain Assessment Pain Assessment: Faces Faces Pain Scale: Hurts even more Pain Location: RLE, espcially close to knee Pain Descriptors / Indicators: Grimacing Pain Intervention(s): Monitored during session                                                          Frequency  Min 2X/week        Progress Toward Goals  OT Goals(current goals can now be found in the care plan section)  Progress towards OT goals: Progressing toward goals  Acute Rehab OT Goals OT Goal Formulation: With patient Time For Goal Achievement: 05/08/23 Potential to Achieve Goals: Good  Plan Discharge plan needs to be updated    Co-evaluation    PT/OT/SLP Co-Evaluation/Treatment: Yes Reason for Co-Treatment: Complexity of the patient's impairments (multi-system involvement);For patient/therapist safety   OT goals addressed during session: ADL's and self-care      AM-PAC OT "6 Clicks" Daily Activity     Outcome Measure   Help from another person eating meals?: None Help from another person taking care of personal grooming?: A Little Help from another person toileting, which includes using toliet, bedpan, or urinal?: A Lot Help from another person bathing (including washing, rinsing, drying)?: A Lot Help from another person to put on and taking  off regular upper body clothing?: A Little Help from another person to put on and taking off regular lower body clothing?: A Lot 6 Click Score: 16    End of Session Equipment Utilized During Treatment: Gait belt;Rollator (4 wheels)  OT Visit Diagnosis: Unsteadiness on feet (R26.81);Other abnormalities of gait and mobility (R26.89);History of falling (Z91.81)   Activity Tolerance Patient tolerated treatment well   Patient Left in chair;with call bell/phone within reach;with chair alarm set;with family/visitor present   Nurse Communication Mobility status        Time: 1610-9604 OT Time Calculation (min): 29 min  Charges: OT General Charges $OT Visit: 1 Visit OT Treatments $Self Care/Home Management : 8-22 mins  05/01/2023  RP, OTR/L  Acute Rehabilitation Services  Office:  [phone]   Suzanna Obey 05/01/2023, 10:38 AM

## 2023-05-01 NOTE — TOC Progression Note (Signed)
Transition of Care [facility]) - Progression Note    Patient Details  Name: Danny Brady MRN: [id number] Date of Birth: [birth year]  Transition of Care [facility]) CM/SW Contact  Erin Sons, [place] Phone Number: 05/01/2023, 12:05 PM  Clinical Narrative:     CSW is notified by [facility] that per their covid policy, they would not be able to admit pt until Tuesday 05/09/23. TOC will explore other SNF options though most have 10 day precaution policies.   Expected Discharge Plan: Skilled Nursing Facility Barriers to Discharge: SNF Covid  Expected Discharge Plan and Services In-house Referral: Clinical Social Work   Post Acute Care Choice: Skilled Nursing Facility Living arrangements for the past 2 months: Single Family Home                                       Social Determinants of Health (SDOH) Interventions SDOH Screenings   Food Insecurity: No Food Insecurity (04/22/2023)  Housing: Low Risk  (04/22/2023)  Transportation Needs: No Transportation Needs (04/22/2023)  Utilities: Not At Risk (04/22/2023)  Tobacco Use: Low Risk  (04/28/2023)    Readmission Risk Interventions     No data to display

## 2023-05-01 NOTE — Progress Notes (Signed)
Physical Therapy Treatment Patient Details Name: Danny Brady MRN: [id number] DOB: 27-Jul-1927 Today's Date: 05/01/2023   History of Present Illness Danny Brady is a 89 y.o. male who presented 6/28 after a mechanical fall resulting in a R periprosthetic distal femur fx; s/p ORIF 6/29, WBAT; PMH includes: hypertension, hyperlipidemia, HFrEF, CVA, paroxysmal A-fib not anticoagulated    PT Comments  The pt was agreeable to session with focus on continued progressive mobility and wt bearing. He was able to complete repeated sit-stand from EOB with progressively increased assist as the bed height was lowered, but needed from minA of 1 to modA of 2 to complete stand with use of rollator. The pt was then able to complete standing wt shifts and progress to small steps with increased assist during stance phase on RLE. Pt limited to small lateral steps at this time, but is making good progress with standing tolerance and decreased assist needed. Will continue to benefit from maximal mobility and inpatient therapies after d/c to prepare for return home.      Assistance Recommended at Discharge Frequent or constant Supervision/Assistance  If plan is discharge home, recommend the following:  Can travel by private vehicle    A lot of help with walking and/or transfers;Two people to help with walking and/or transfers;Two people to help with bathing/dressing/bathroom;Assistance with cooking/housework;Assist for transportation;Help with stairs or ramp for entrance   No  Equipment Recommendations  Hospital bed;Wheelchair cushion (measurements PT);Other (comment) (may consider hoyer lift and ambulance transport home, if pt/family opt for no SNF)    Recommendations for Other Services       Precautions / Restrictions Precautions Precautions: Fall Precaution Comments: Very fragile skin Required Braces or Orthoses: Other Brace Other Brace: Has custom shoes for foot deformities and leg length  discrepancy Restrictions Weight Bearing Restrictions: Yes RLE Weight Bearing: Weight bearing as tolerated     Mobility  Bed Mobility Overal bed mobility: Needs Assistance Bed Mobility: Supine to Sit     Supine to sit: Mod assist, +2 for physical assistance, Min assist          Transfers Overall transfer level: Needs assistance Equipment used: Rollator (4 wheels) Transfers: Sit to/from Stand, Bed to chair/wheelchair/BSC Sit to Stand: Min assist, +2 physical assistance Stand pivot transfers: Mod assist, +2 physical assistance Step pivot transfers: +2 physical assistance, Mod assist       General transfer comment: modA of 2 to allow for safe wt bearing on RLE, progressively increased assist from lower surface with sit-stand, minA of 1 from elevated EOB, modA  of 2 from low recliner    Ambulation/Gait Ambulation/Gait assistance: Mod assist, +2 physical assistance Gait Distance (Feet): 3 Feet Assistive device: Rollator (4 wheels) Gait Pattern/deviations: Step-to pattern, Decreased stance time - right, Decreased step length - left, Decreased weight shift to right, Shuffle Gait velocity: decreased     General Gait Details: pt with small shuffling steps, no overt buckling or LOB but modA of 2 to steady, especially with wt shift to RLE    Balance Overall balance assessment: Needs assistance Sitting-balance support: Feet supported Sitting balance-Leahy Scale: Fair   Postural control: Posterior lean Standing balance support: Reliant on assistive device for balance Standing balance-Leahy Scale: Poor                              Cognition Arousal/Alertness: Awake/alert Behavior During Therapy: WFL for tasks assessed/performed Overall Cognitive Status: Within Functional Limits for  tasks assessed                                          Exercises General Exercises - Lower Extremity Heel Slides: AROM, Both, 10 reps, Supine Hip  ABduction/ADduction: AROM, Right, 10 reps, Supine    General Comments General comments (skin integrity, edema, etc.): VSS on RA      Pertinent Vitals/Pain Pain Assessment Pain Assessment: Faces Faces Pain Scale: Hurts even more Pain Location: RLE, espcially close to knee Pain Descriptors / Indicators: Grimacing Pain Intervention(s): Limited activity within patient's tolerance, Monitored during session, Repositioned     PT Goals (current goals can now be found in the care plan section) Acute Rehab PT Goals Patient Stated Goal: return home PT Goal Formulation: With patient/family Time For Goal Achievement: 05/07/23 Potential to Achieve Goals: Good Progress towards PT goals: Progressing toward goals    Frequency    Min 5X/week      PT Plan      Co-evaluation PT/OT/SLP Co-Evaluation/Treatment: Yes Reason for Co-Treatment: Complexity of the patient's impairments (multi-system involvement);For patient/therapist safety PT goals addressed during session: Mobility/safety with mobility;Balance;Proper use of DME;Strengthening/ROM OT goals addressed during session: ADL's and self-care      AM-PAC PT "6 Clicks" Mobility   Outcome Measure  Help needed turning from your back to your side while in a flat bed without using bedrails?: A Lot Help needed moving from lying on your back to sitting on the side of a flat bed without using bedrails?: A Lot Help needed moving to and from a bed to a chair (including a wheelchair)?: Total Help needed standing up from a chair using your arms (e.g., wheelchair or bedside chair)?: Total Help needed to walk in hospital room?: Total Help needed climbing 3-5 steps with a railing? : Total 6 Click Score: 8    End of Session Equipment Utilized During Treatment: Gait belt Activity Tolerance: Patient tolerated treatment well Patient left: in chair;with call bell/phone within reach;with chair alarm set;with family/visitor present Nurse Communication:  Mobility status PT Visit Diagnosis: Unsteadiness on feet (R26.81);Other abnormalities of gait and mobility (R26.89);History of falling (Z91.81);Pain;Muscle weakness (generalized) (M62.81) Pain - Right/Left: Right Pain - part of body: Leg     Time: 1610-9604 PT Time Calculation (min) (ACUTE ONLY): 34 min  Charges:    $Therapeutic Exercise: 8-22 mins PT General Charges $$ ACUTE PT VISIT: 1 Visit                     Vickki Muff, PT, DPT   Acute Rehabilitation Department Office [phone] Secure Chat Communication Preferred   Ronnie Derby 05/01/2023, 10:48 AM

## 2023-05-01 NOTE — Progress Notes (Signed)
9 Days Post-Op  Procedure(s) (LRB): OPEN REDUCTION INTERNAL FIXATION (ORIF) PERIPROSTHETIC FRACTURE (Right)  Subjective: Patient reports pain as mild to none.  Tolerating diet.  Some intermittent difficulty with urination/constipation. Continues to mobilize with PT/OT.  No CP, SOB, fevers, N/V, abdominal pain.  Recently tested positive for covid.  Pending DC to SNF.  Low Hgb requiring blood transfusions on 6/30 and 7/7.    Hgb trending around 7-8.  Pt on lovenox, being monitored per medicine.  Objective:   VITALS:   Vitals:   04/30/23 1425 04/30/23 2049 05/01/23 0440 05/01/23 0613  BP: 122/78 124/70 120/75   Pulse: 61 63 64   Resp: 16 20 19    Temp: 98.6 F (37 C) 98.1 F (36.7 C) 98 F (36.7 C)   TempSrc: Oral Oral Oral   SpO2: 98% 99% 99%   Weight:    100 kg  Height:          Latest Ref Rng & Units 05/01/2023    4:26 AM 04/30/2023    6:10 PM 04/30/2023    3:28 AM  CBC  WBC 4.0 - 10.5 K/uL 3.4   2.7   Hemoglobin 13.0 - 17.0 g/dL 8.7  9.8  7.1   Hematocrit 39.0 - 52.0 % 26.6  30.1  22.5   Platelets 150 - 400 K/uL 166   157       Latest Ref Rng & Units 05/01/2023    4:26 AM 04/30/2023    3:28 AM 04/28/2023    3:20 AM  BMP  Glucose 70 - 99 mg/dL 161  096  045   BUN 8 - 23 mg/dL 13  15  16    Creatinine 0.61 - 1.24 mg/dL 4.09  8.11  9.14   Sodium 135 - 145 mmol/L 131  131  129   Potassium 3.5 - 5.1 mmol/L 3.5  3.4  3.9   Chloride 98 - 111 mmol/L 100  99  98   CO2 22 - 32 mmol/L 24  24  25    Calcium 8.9 - 10.3 mg/dL 7.8  7.8  8.2    Intake/Output      07/07 0701 07/08 0700   P.O. 340   Blood 330   Total Intake(mL/kg) 670 (6.7)   Urine (mL/kg/hr) 400 (0.2)   Total Output 400   Net +270       Stool Occurrence 1 x      Physical Exam: General: NAD.  Sititng up in bedside chair, calm, comfortable Resp: No increased wob Cardio: regular rate and rhythm ABD soft Neurologically intact MSK Neurovascularly intact Sensation intact distally Intact pulses  distally Dorsiflexion/Plantar flexion intact Incision: dressing C/D/I   Assessment: 9 Days Post-Op   S/P Procedure(s) (LRB): OPEN REDUCTION INTERNAL FIXATION (ORIF) PERIPROSTHETIC FRACTURE (Right)  Principal Problem:   Femur fracture, right (HCC) Active Problems:   Paroxysmal atrial fibrillation (HCC)   HFrEF (heart failure with reduced ejection fraction) (HCC)   Plan:  Advance diet Up with therapy Incentive Spirometry Elevate and Apply ice  Post op recs: WB: WBAT RLE Abx: ancef x23 hours post op Imaging: PACU xrays Dressing: keep intact until follow up, change PRN if soiled or saturated. DVT prophylaxis: lovenox in house, dc on aspirin 81mg  BID Follow up: 2 weeks after surgery for a wound check with Dr. Blanchie Dessert at [facility].  DC: anticipate potential SNF placement Address: [address], [place], [place] [address]  Office Phone: [phone]   Cecil Cobbs, PA-C  Office [phone] 05/01/2023, 6:54 AM

## 2023-05-01 NOTE — Care Management Important Message (Signed)
Important Message  Patient Details  Name: Danny Brady MRN: [id number] Date of Birth: [birth year]   Medicare Important Message Given:  Yes     Sherilyn Banker 05/01/2023, 3:39 PM

## 2023-05-01 NOTE — Progress Notes (Signed)
PROGRESS NOTE   Danny Brady  WUJ:[id number] DOB: [birth year] DOA: 04/22/2023 PCP: Danella Penton, MD   Brief Narrative:  Danny Brady is a 89 y.o. male with medical history significant for hypertension, hyperlipidemia, HFrEF, CVA, paroxysmal A-fib not anticoagulated who presented after a mechanical fall.  Uses a walker at baseline.  He fell after a turn with his walker.  He was in his usual state of health prior to this. In the ED he was found to have periprosthetic distal right femur fracture.  He was hospitalized for further management.  Underwent ORIF on 6/29.  Assessment and Plan:  COVID-19 infection Patient did have a cough last week but never had fever.  Chest x-ray did not show any opacities.  Of his caregivers who was spending time with him in the hospital tested positive for COVID-19.  Patient was tested and he also came back as positive.  No need for any therapeutics since patient has minimal symptoms.   Periprosthetic distal right femur fracture, post mechanical fall. S/p ORIF.  Seems to be stable for the most part.  Seen by PT and OT.  Initially inpatient rehabilitation was being considered.  However insurance has denied this.  SNF being pursued. Orthopedics recommends aspirin 81 mg twice daily at discharge.   Stable for the most part.   Chronic normocytic anemia/acute blood loss anemia Drop in hemoglobin was due to postoperative blood loss.  Hemoglobin dropped to 5.9.  He was transfused.  Hemoglobin has been stable but has drifted down in the last 48 hours.  He was transfused another unit of PRBC yesterday with improvement in hemoglobin.   Hyponatremia Urine osmolality 501.  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  Patient was also on furosemide prior to admission.  He seems to be fairly euvolemic.   Patient's furosemide was resumed.  He was started on salt tablets.  Sodium level improved from 127-131.  Hypokalemia Continue to supplement  Severe  constipation Constipation resolved after he was given enema.  Continue bowel regimen.  Changed to once daily.  Hyperglycemia HbA1c 5.5.  Paroxysmal A-fib  Rate is controlled. Not on anticoagulation Monitor on telemetry   AKI due to urinary retention Renal function has improved after Foley catheter was placed.  Continue with finasteride.  Voiding trial once he is more ambulatory.  Will need to be discharged with Foley catheter.  Chronic HFrEF Fairly euvolemic.  Continue to monitor volume status.  Chronic anxiety/depression Continue Zoloft. Experiencing some sundowning occasionally.  Reorient daily.  No focal neurological deficits.  UA unremarkable.  GERD Continue PPI  DVT prophylaxis: Lovenox CODE STATUS: Full code Family communication: Son being updated periodically Disposition: Should be ready for SNF by tomorrow if hemoglobin is stable.  His positive COVID-19 status may delay discharge.  TOC to follow.   Subjective: No complaints offered.  Cough is getting better.  No shortness of breath.  Objective:  Vitals:   04/30/23 2049 05/01/23 0440 05/01/23 0613 05/01/23 0800  BP: 124/70 120/75  121/78  Pulse: 63 64  60  Resp: 20 19  18   Temp: 98.1 F (36.7 C) 98 F (36.7 C)  97.9 F (36.6 C)  TempSrc: Oral Oral  Oral  SpO2: 99% 99%  98%  Weight:   100 kg   Height:        General appearance: Awake alert.  In no distress Resp: Clear to auscultation bilaterally.  Normal effort Cardio: S1-S2 is normal regular.  No S3-S4.  No rubs murmurs  or bruit GI: Abdomen is soft.  Nontender nondistended.  Bowel sounds are present normal.  No masses organomegaly Extremities: No edema.     Data Reviewed:   CBC: Recent Labs  Lab 04/26/23 0237 04/27/23 0306 04/28/23 0320 04/30/23 0328 04/30/23 1810 05/01/23 0426  WBC 5.1 4.7 3.9* 2.7*  --  3.4*  HGB 7.5* 8.0* 7.9* 7.1* 9.8* 8.7*  HCT 22.8* 24.5* 24.8* 22.5* 30.1* 26.6*  MCV 97.9 98.4 99.6 98.3  --  96.0  PLT 133* 160 144*  157  --  166    Basic Metabolic Panel: Recent Labs  Lab 04/26/23 0237 04/27/23 0306 04/28/23 0320 04/30/23 0328 05/01/23 0426  NA 127* 129* 129* 131* 131*  K 4.0 3.7 3.9 3.4* 3.5  CL 95* 97* 98 99 100  CO2 22 21* 25 24 24   GLUCOSE 104* 102* 115* 100* 105*  BUN 22 21 16 15 13   CREATININE 1.06 1.02 0.98 1.03 0.86  CALCIUM 8.0* 8.1* 8.2* 7.8* 7.8*  MG  --   --   --   --  1.7    GFR: Estimated Creatinine Clearance: 64.3 mL/min (by C-G formula based on SCr of 0.86 mg/dL).   Danny Brady   05/01/2023 10:56 AM

## 2023-05-01 NOTE — Plan of Care (Signed)

## 2023-05-02 DIAGNOSIS — S7291XA Unspecified fracture of right femur, initial encounter for closed fracture: Secondary | ICD-10-CM | POA: Diagnosis not present

## 2023-05-02 DIAGNOSIS — D649 Anemia, unspecified: Secondary | ICD-10-CM | POA: Diagnosis not present

## 2023-05-02 DIAGNOSIS — E871 Hypo-osmolality and hyponatremia: Secondary | ICD-10-CM | POA: Diagnosis not present

## 2023-05-02 DIAGNOSIS — K5900 Constipation, unspecified: Secondary | ICD-10-CM | POA: Diagnosis not present

## 2023-05-02 LAB — HEMOGLOBIN AND HEMATOCRIT, BLOOD
HCT: 27.4 % — ABNORMAL LOW (ref 39.0–52.0)
Hemoglobin: 8.7 g/dL — ABNORMAL LOW (ref 13.0–17.0)

## 2023-05-02 LAB — GLUCOSE, CAPILLARY
Glucose-Capillary: 99 mg/dL (ref 70–99)
Glucose-Capillary: 111 mg/dL — ABNORMAL HIGH (ref 70–99)
Glucose-Capillary: 156 mg/dL — ABNORMAL HIGH (ref 70–99)

## 2023-05-02 NOTE — Progress Notes (Signed)
Physical Therapy Treatment Patient Details Name: Danny Brady MRN: [id number] DOB: [birth year] Today's Date: 05/02/2023   History of Present Illness Danny Brady is a 89 y.o. male who presented 6/28 after a mechanical fall resulting in a R periprosthetic distal femur fx; s/p ORIF 6/29, WBAT; PMH includes: hypertension, hyperlipidemia, HFrEF, CVA, paroxysmal A-fib not anticoagulated    PT Comments  Pt alone in room, rolled to R side off of bedpan, with liquid stool over LE and bed pad, Pt require max for management of LE to roll further to R to remove bed pan and provide pericare. Pt son returned to room and with modAx2 able to roll to L to remove soiled linen. Discussed getting up to recliner when pt had further need for bed pan. Rolled to R and with mod-maxAx2 bed pan replaced. PT will follow back for further therapy as able. D/c plans remain appropriate.      Assistance Recommended at Discharge Frequent or constant Supervision/Assistance  If plan is discharge home, recommend the following:  Can travel by private vehicle    A lot of help with walking and/or transfers;Two people to help with walking and/or transfers;Two people to help with bathing/dressing/bathroom;Assistance with cooking/housework;Assist for transportation;Help with stairs or ramp for entrance   No  Equipment Recommendations  Hospital bed;Wheelchair cushion (measurements PT);Other (comment) (may consider hoyer lift and ambulance transport home, if pt/family opt for no SNF)    Recommendations for Other Services       Precautions / Restrictions Precautions Precautions: Fall Precaution Comments: Very fragile skin Required Braces or Orthoses: Other Brace Other Brace: Has custom shoes for foot deformities and leg length discrepancy Restrictions Weight Bearing Restrictions: Yes RLE Weight Bearing: Weight bearing as tolerated     Mobility  Bed Mobility Overal bed mobility: Needs Assistance Bed Mobility:  Rolling Rolling: Max assist, Mod assist, +2 for physical assistance         General bed mobility comments: max A for management of R LE with rolling off bed pan to his left and modAx2 for manangement of LE for rolling to R for pericare, and again for rolling to R to replace bed pan as pt with need for further BM                                   Cognition Arousal/Alertness: Awake/alert Behavior During Therapy: WFL for tasks assessed/performed Overall Cognitive Status: Within Functional Limits for tasks assessed                                             General Comments General comments (skin integrity, edema, etc.): pt noted to have area of abrasion on back where bed pan was placed RN notified, also recommended some butt paste for area of irritation where stool was against his skin      Pertinent Vitals/Pain Pain Assessment Pain Assessment: Faces Faces Pain Scale: Hurts even more Pain Location: RLE, especially with rolling for bed pan placement Pain Descriptors / Indicators: Grimacing Pain Intervention(s): Limited activity within patient's tolerance, Monitored during session, Repositioned     PT Goals (current goals can now be found in the care plan section) Acute Rehab PT Goals Patient Stated Goal: return home PT Goal Formulation: With patient/family Time For Goal Achievement: 05/07/23 Potential to Achieve Goals: Good  Progress towards PT goals: Not progressing toward goals - comment    Frequency    Min 5X/week      PT Plan Current plan remains appropriate       AM-PAC PT "6 Clicks" Mobility   Outcome Measure  Help needed turning from your back to your side while in a flat bed without using bedrails?: A Lot Help needed moving from lying on your back to sitting on the side of a flat bed without using bedrails?: A Lot Help needed moving to and from a bed to a chair (including a wheelchair)?: Total Help needed standing up from a  chair using your arms (e.g., wheelchair or bedside chair)?: Total Help needed to walk in hospital room?: Total Help needed climbing 3-5 steps with a railing? : Total 6 Click Score: 8    End of Session   Activity Tolerance: Patient tolerated treatment well;Other (comment) (treatment limited by need for BM) Patient left: with call bell/phone within reach;with family/visitor present;in bed;Other (comment) (request to call for RN as soon as finished with bedpan to reduce risk for skin injury) Nurse Communication: Mobility status PT Visit Diagnosis: Unsteadiness on feet (R26.81);Other abnormalities of gait and mobility (R26.89);History of falling (Z91.81);Pain;Muscle weakness (generalized) (M62.81) Pain - Right/Left: Right Pain - part of body: Leg     Time: 0981-1914 PT Time Calculation (min) (ACUTE ONLY): 23 min  Charges:    $Therapeutic Activity: 23-37 mins PT General Charges $$ ACUTE PT VISIT: 1 Visit                     Lindzie Boxx B. Beverely Risen PT, DPT Acute Rehabilitation Services Please use secure chat or  Call Office [phone]    [facility] 05/02/2023, 10:49 AM

## 2023-05-02 NOTE — Progress Notes (Signed)
PROGRESS NOTE   Danny Brady  ZOX:[id number] DOB: [birth year] DOA: 04/22/2023 PCP: Danella Penton, MD   Brief Narrative:  Danny Brady is a 89 y.o. male with medical history significant for hypertension, hyperlipidemia, HFrEF, CVA, paroxysmal A-fib not anticoagulated who presented after a mechanical fall.  Uses a walker at baseline.  He fell after a turn with his walker.  He was in his usual state of health prior to this. In the ED he was found to have periprosthetic distal right femur fracture.  He was hospitalized for further management.  Underwent ORIF on 6/29.  Assessment and Plan:  COVID-19 infection Patient did have a cough last week but never had fever.  Chest x-ray did not show any opacities.  One of his caregivers who was spending time with him in the hospital tested positive for COVID-19.  Patient was tested and he also came back as positive.  No need for any therapeutics since patient has minimal symptoms.   Continue with cough medications.  Saturations are still normal on room air.  Denies any shortness of breath.  Periprosthetic distal right femur fracture, post mechanical fall. S/p ORIF.  Seems to be stable for the most part.  Seen by PT and OT.  Initially inpatient rehabilitation was being considered.  However insurance has denied this.  SNF being pursued. Orthopedics recommends aspirin 81 mg twice daily at discharge.   Stable for the most part.   Chronic normocytic anemia/acute blood loss anemia Drop in hemoglobin was due to postoperative blood loss.  Hemoglobin dropped to 5.9.  He was transfused.   Hemoglobin drifted down again.  He was transfused again on 7/7.  Hemoglobin responded appropriately and noted to be stable.     Hyponatremia Urine osmolality 501.  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  Patient was also on furosemide prior to admission.  He seems to be fairly euvolemic.   Patient's furosemide was resumed.  He was started on salt tablets.  Sodium level  improved from 127 to 131.  Check periodically.  Hypokalemia Continue to supplement as needed  Severe constipation Constipation resolved after he was given enema.  Continue bowel regimen.  Changed to once daily.  Hyperglycemia HbA1c 5.5.  Paroxysmal A-fib  Rate is controlled. Not on anticoagulation Monitor on telemetry   AKI due to urinary retention Renal function has improved after Foley catheter was placed.  Continue with finasteride.  Voiding trial once he is more ambulatory.  Will need to be discharged with Foley catheter.  Chronic HFrEF Fairly euvolemic.  Continue to monitor volume status.  Chronic anxiety/depression Continue Zoloft. Experiencing some sundowning occasionally.  Reorient daily.  No focal neurological deficits.  UA unremarkable.  GERD Continue PPI  DVT prophylaxis: Lovenox CODE STATUS: Full code Family communication: Son being updated periodically Disposition: Should be ready for SNF by tomorrow if hemoglobin is stable.  His positive COVID-19 status may delay discharge.  TOC to follow.   Subjective: Continues to have occasional cough.  Denies any shortness of breath or chest pain.  No nausea or vomiting.  Objective:  Vitals:   05/01/23 1919 05/02/23 0424 05/02/23 0740 05/02/23 0944  BP: 108/73 109/68  129/74  Pulse: 69 67  63  Resp: 18 18  18   Temp: 98.2 F (36.8 C)   98.1 F (36.7 C)  TempSrc: Oral   Oral  SpO2: 98% 95%  98%  Weight:   102.6 kg   Height:        General  appearance: Awake alert.  In no distress Resp: Clear to auscultation bilaterally.  Normal effort Cardio: S1-S2 is normal regular.  No S3-S4.  No rubs murmurs or bruit GI: Abdomen is soft.  Nontender nondistended.  Bowel sounds are present normal.  No masses organomegaly Extremities: No edema.    Data Reviewed:   CBC: Recent Labs  Lab 04/26/23 0237 04/27/23 0306 04/28/23 0320 04/30/23 0328 04/30/23 1810 05/01/23 0426 05/02/23 0426  WBC 5.1 4.7 3.9* 2.7*  --  3.4*   --   HGB 7.5* 8.0* 7.9* 7.1* 9.8* 8.7* 8.7*  HCT 22.8* 24.5* 24.8* 22.5* 30.1* 26.6* 27.4*  MCV 97.9 98.4 99.6 98.3  --  96.0  --   PLT 133* 160 144* 157  --  166  --     Basic Metabolic Panel: Recent Labs  Lab 04/26/23 0237 04/27/23 0306 04/28/23 0320 04/30/23 0328 05/01/23 0426  NA 127* 129* 129* 131* 131*  K 4.0 3.7 3.9 3.4* 3.5  CL 95* 97* 98 99 100  CO2 22 21* 25 24 24   GLUCOSE 104* 102* 115* 100* 105*  BUN 22 21 16 15 13   CREATININE 1.06 1.02 0.98 1.03 0.86  CALCIUM 8.0* 8.1* 8.2* 7.8* 7.8*  MG  --   --   --   --  1.7    GFR: Estimated Creatinine Clearance: 65.1 mL/min (by C-G formula based on SCr of 0.86 mg/dL).   Danny Brady   05/02/2023 11:05 AM

## 2023-05-02 NOTE — TOC Progression Note (Signed)
Transition of Care [facility]) - Progression Note    Patient Details  Name: Danny Brady MRN: [id number] Date of Birth: 30-Nov-1926  Transition of Care [facility]) CM/SW Contact  Rosalia Mcavoy Aris [place], [place] Phone Number: 05/02/2023, 4:01 PM  Clinical Narrative:     CSW spoke with pt's son Trey Paula over the phone and discussed potential other SNF facilities in [place] and [place] areas that may be able to take pt sooner than quarantine period. He does express he is only interested in facilities with comparable quality/rating to [employer].   Expected Discharge Plan: Skilled Nursing Facility Barriers to Discharge: SNF Covid  Expected Discharge Plan and Services In-house Referral: Clinical Social Work   Post Acute Care Choice: Skilled Nursing Facility Living arrangements for the past 2 months: Single Family Home                                       Social Determinants of Health (SDOH) Interventions SDOH Screenings   Food Insecurity: No Food Insecurity (04/22/2023)  Housing: Low Risk  (04/22/2023)  Transportation Needs: No Transportation Needs (04/22/2023)  Utilities: Not At Risk (04/22/2023)  Tobacco Use: Low Risk  (04/28/2023)    Readmission Risk Interventions     No data to display

## 2023-05-02 NOTE — Plan of Care (Signed)
  Problem: Education: Goal: Knowledge of General Education information will improve Description Including pain rating scale, medication(s)/side effects and non-pharmacologic comfort measures Outcome: Progressing   Problem: Nutrition: Goal: Adequate nutrition will be maintained Outcome: Progressing   Problem: Coping: Goal: Level of anxiety will decrease Outcome: Progressing   Problem: Elimination: Goal: Will not experience complications related to bowel motility Outcome: Progressing Goal: Will not experience complications related to urinary retention Outcome: Progressing   Problem: Safety: Goal: Ability to remain free from injury will improve Outcome: Progressing   Problem: Skin Integrity: Goal: Risk for impaired skin integrity will decrease Outcome: Progressing   

## 2023-05-03 DIAGNOSIS — S72001A Fracture of unspecified part of neck of right femur, initial encounter for closed fracture: Secondary | ICD-10-CM | POA: Diagnosis not present

## 2023-05-03 LAB — GLUCOSE, CAPILLARY
Glucose-Capillary: 107 mg/dL — ABNORMAL HIGH (ref 70–99)
Glucose-Capillary: 108 mg/dL — ABNORMAL HIGH (ref 70–99)
Glucose-Capillary: 120 mg/dL — ABNORMAL HIGH (ref 70–99)
Glucose-Capillary: 146 mg/dL — ABNORMAL HIGH (ref 70–99)
Glucose-Capillary: 121 mg/dL — ABNORMAL HIGH (ref 70–99)
Glucose-Capillary: 108 mg/dL — ABNORMAL HIGH (ref 70–99)

## 2023-05-03 LAB — CBC
HCT: 28.1 % — ABNORMAL LOW (ref 39.0–52.0)
Hemoglobin: 9.1 g/dL — ABNORMAL LOW (ref 13.0–17.0)
MCH: 30.8 pg (ref 26.0–34.0)
MCHC: 32.4 g/dL (ref 30.0–36.0)
MCV: 95.3 fL (ref 80.0–100.0)
Platelets: 190 10*3/uL (ref 150–400)
RBC: 2.95 MIL/uL — ABNORMAL LOW (ref 4.22–5.81)
RDW: 18.5 % — ABNORMAL HIGH (ref 11.5–15.5)
WBC: 4.2 10*3/uL (ref 4.0–10.5)
nRBC: 0 % (ref 0.0–0.2)

## 2023-05-03 LAB — BASIC METABOLIC PANEL
Anion gap: 9 (ref 5–15)
BUN: 10 mg/dL (ref 8–23)
CO2: 22 mmol/L (ref 22–32)
Calcium: 8.3 mg/dL — ABNORMAL LOW (ref 8.9–10.3)
Chloride: 100 mmol/L (ref 98–111)
Creatinine, Ser: 0.88 mg/dL (ref 0.61–1.24)
GFR, Estimated: >60 mL/min (ref >60)
Glucose, Bld: 108 mg/dL — ABNORMAL HIGH (ref 70–99)
Potassium: 3.7 mmol/L (ref 3.5–5.1)
Sodium: 131 mmol/L — ABNORMAL LOW (ref 135–145)

## 2023-05-03 NOTE — Progress Notes (Signed)
Occupational Therapy Treatment Patient Details Name: Danny Brady MRN: [id number] DOB: 12-26-1926 Today's Date: 05/03/2023   History of present illness Danny Brady is a 89 y.o. male who presented 6/28 after a mechanical fall resulting in a R periprosthetic distal femur fx; s/p ORIF 6/29, WBAT; PMH includes: hypertension, hyperlipidemia, HFrEF, CVA, paroxysmal A-fib not anticoagulated   OT comments  OT following up with patient to help get back to bed and discuss the use of AE to help with bADLs, pt reports he has all of the AE but his PCAs complete all of those tasks at basline, deferred AE education and focused on progressing with ambulation. Pt continuing to need Mod A +2 and his RLE is noted to buckle during step tranfers which make ambulation a high falls risk at this time. Family wanted to re-establish pt goal to pivot into w/c. OT to continue to progress pt as able, DC plans remain appropriate for SNF   Recommendations for follow up therapy are one component of a multi-disciplinary discharge planning process, led by the attending physician.  Recommendations may be updated based on patient status, additional functional criteria and insurance authorization.    Assistance Recommended at Discharge Frequent or constant Supervision/Assistance  Patient can return home with the following  Two people to help with walking and/or transfers;A lot of help with bathing/dressing/bathroom;Assistance with cooking/housework;Assist for transportation;Help with stairs or ramp for entrance;Direct supervision/assist for financial management;Direct supervision/assist for medications management   Equipment Recommendations  None recommended by OT    Recommendations for Other Services      Precautions / Restrictions Precautions Precautions: Fall Precaution Comments: Very fragile skin Required Braces or Orthoses: Other Brace Other Brace: Has custom shoes for foot deformities and leg length  discrepancy Restrictions Weight Bearing Restrictions: Yes RLE Weight Bearing: Weight bearing as tolerated       Mobility Bed Mobility Overal bed mobility: Needs Assistance Bed Mobility: Sit to Supine     Supine to sit: Mod assist Sit to supine: Max assist   General bed mobility comments: Pt not able to fully clear BLEs over bed to return to supine, needing some assist with trunk control as well when attempt to clear legs over bed.    Transfers Overall transfer level: Needs assistance Equipment used: Rollator (4 wheels) Transfers: Sit to/from Stand, Bed to chair/wheelchair/BSC Sit to Stand: Mod assist, +2 physical assistance, +2 safety/equipment     Step pivot transfers: +2 physical assistance, Mod assist, +2 safety/equipment     General transfer comment: RLE buckles     Balance Overall balance assessment: Needs assistance Sitting-balance support: Feet supported Sitting balance-Leahy Scale: Fair     Standing balance support: Reliant on assistive device for balance, Bilateral upper extremity supported Standing balance-Leahy Scale: Poor Standing balance comment: RW reliant             ADL either performed or assessed with clinical judgement   ADL Overall ADL's : Needs assistance/impaired                     Lower Body Dressing: Maximal assistance;Sitting/lateral leans Lower Body Dressing Details (indicate cue type and reason): to don/doff shoes             Functional mobility during ADLs: Moderate assistance;+2 for physical assistance;+2 for safety/equipment;Rollator (4 wheels) General ADL Comments: Pt son present and assisting during OT session. Trialed use of Rollator, pt with good understanding and management of brakes      Cognition Arousal/Alertness: Awake/alert  Behavior During Therapy: WFL for tasks assessed/performed Overall Cognitive Status: Within Functional Limits for tasks assessed             Exercises   Shoulder Instructions    General Comments Pt son present during session, pt noted to have an open skin wound on his forearm that was previously covered by wraps that fell off during session.    Pertinent Vitals/ Pain       Pain Assessment Pain Assessment: No/denies pain Faces Pain Scale: Hurts little more Pain Location: RLE Pain Descriptors / Indicators: Guarding, Discomfort Pain Intervention(s): Monitored during session, Repositioned, Limited activity within patient's tolerance   Frequency  Min 2X/week        Progress Toward Goals  OT Goals(current goals can now be found in the care plan section)  Progress towards OT goals: Progressing toward goals  Acute Rehab OT Goals Patient Stated Goal: To be able to pivot into a wheelchair OT Goal Formulation: With family Time For Goal Achievement: 05/08/23 Potential to Achieve Goals: Good  Plan Discharge plan remains appropriate;Frequency remains appropriate    AM-PAC OT "6 Clicks" Daily Activity     Outcome Measure   Help from another person eating meals?: None Help from another person taking care of personal grooming?: A Little Help from another person toileting, which includes using toliet, bedpan, or urinal?: A Lot Help from another person bathing (including washing, rinsing, drying)?: A Lot Help from another person to put on and taking off regular upper body clothing?: A Little Help from another person to put on and taking off regular lower body clothing?: A Lot 6 Click Score: 16    End of Session Equipment Utilized During Treatment: Gait belt;Rollator (4 wheels)  OT Visit Diagnosis: Unsteadiness on feet (R26.81);Other abnormalities of gait and mobility (R26.89);History of falling (Z91.81)   Activity Tolerance Patient tolerated treatment well   Patient Left in bed;with call bell/phone within reach;with family/visitor present   Nurse Communication Mobility status        Time: 1610-9604 OT Time Calculation (min): 32 min  Charges: OT  General Charges $OT Visit: 1 Visit OT Treatments $Therapeutic Activity: 23-37 mins  05/03/2023  AB, OTR/L  Acute Rehabilitation Services  Office: [phone]   Tristan Schroeder 05/03/2023, 5:19 PM

## 2023-05-03 NOTE — Progress Notes (Signed)
Occupational Therapy Treatment Patient Details Name: Danny Brady MRN: [id number] DOB: [birth year] Today's Date: 05/03/2023   History of present illness Danny Brady is a 89 y.o. male who presented 6/28 after a mechanical fall resulting in a R periprosthetic distal femur fx; s/p ORIF 6/29, WBAT; PMH includes: hypertension, hyperlipidemia, HFrEF, CVA, paroxysmal A-fib not anticoagulated   OT comments  Pt continues to be limited by decreased movement/AROM of RLE. Pt needing Mod A +2 for functional transfers and displays decreased activity tolerance. Pt only able to pivot into chair and was very limited in the steps he was able to take. Deferred AE education due to pt fatigue, OT to continue to progress pt as able. DC plans remain appropriate for SNF.   Recommendations for follow up therapy are one component of a multi-disciplinary discharge planning process, led by the attending physician.  Recommendations may be updated based on patient status, additional functional criteria and insurance authorization.    Assistance Recommended at Discharge Frequent or constant Supervision/Assistance  Patient can return home with the following  Two people to help with walking and/or transfers;A lot of help with bathing/dressing/bathroom;Assistance with cooking/housework;Assist for transportation;Help with stairs or ramp for entrance;Direct supervision/assist for financial management;Direct supervision/assist for medications management   Equipment Recommendations  None recommended by OT    Recommendations for Other Services      Precautions / Restrictions Precautions Precautions: Fall Precaution Comments: Very fragile skin Required Braces or Orthoses: Other Brace Other Brace: Has custom shoes for foot deformities and leg length discrepancy Restrictions Weight Bearing Restrictions: Yes RLE Weight Bearing: Weight bearing as tolerated       Mobility Bed Mobility Overal bed mobility: Needs  Assistance Bed Mobility: Supine to Sit     Supine to sit: Mod assist     General bed mobility comments: Pt needing assist with RLE management to EOB.    Transfers Overall transfer level: Needs assistance Equipment used: Rollator (4 wheels) Transfers: Sit to/from Stand, Bed to chair/wheelchair/BSC Sit to Stand: Mod assist, +2 physical assistance, From elevated surface     Step pivot transfers: +2 physical assistance, Mod assist     General transfer comment: Pt needign cues for hand placement and to take steps with RLE.     Balance Overall balance assessment: Needs assistance Sitting-balance support: Feet supported Sitting balance-Leahy Scale: Fair     Standing balance support: Reliant on assistive device for balance, Bilateral upper extremity supported Standing balance-Leahy Scale: Poor Standing balance comment: RW reliant                           ADL either performed or assessed with clinical judgement   ADL                       Lower Body Dressing: Maximal assistance;Sitting/lateral leans Lower Body Dressing Details (indicate cue type and reason): to don/doff shoes             Functional mobility during ADLs: Moderate assistance;+2 for physical assistance;+2 for safety/equipment;Rolling walker (2 wheels) General ADL Comments: Pt very limited in ambulation to short distances, pivots but not take many steps      Cognition Arousal/Alertness: Awake/alert Behavior During Therapy: WFL for tasks assessed/performed Overall Cognitive Status: Within Functional Limits for tasks assessed  Pertinent Vitals/ Pain       Pain Assessment Pain Assessment: Faces Faces Pain Scale: Hurts little more Pain Location: RLE Pain Descriptors / Indicators: Guarding, Discomfort Pain Intervention(s): Monitored during session, Repositioned, Limited activity within patient's  tolerance   Frequency  Min 2X/week        Progress Toward Goals  OT Goals(current goals can now be found in the care plan section)  Progress towards OT goals: Progressing toward goals  Acute Rehab OT Goals Patient Stated Goal: to improve strength with standing and return to PLOF OT Goal Formulation: With patient Time For Goal Achievement: 05/08/23 Potential to Achieve Goals: Good  Plan Discharge plan remains appropriate;Frequency remains appropriate       AM-PAC OT "6 Clicks" Daily Activity     Outcome Measure   Help from another person eating meals?: None Help from another person taking care of personal grooming?: A Little Help from another person toileting, which includes using toliet, bedpan, or urinal?: A Lot Help from another person bathing (including washing, rinsing, drying)?: A Lot Help from another person to put on and taking off regular upper body clothing?: A Little Help from another person to put on and taking off regular lower body clothing?: A Lot 6 Click Score: 16    End of Session Equipment Utilized During Treatment: Gait belt;Rolling walker (2 wheels)  OT Visit Diagnosis: Unsteadiness on feet (R26.81);Other abnormalities of gait and mobility (R26.89);History of falling (Z91.81)   Activity Tolerance Patient tolerated treatment well   Patient Left in chair;with call bell/phone within reach;with chair alarm set;with family/visitor present   Nurse Communication Mobility status        Time: 1610-9604 OT Time Calculation (min): 34 min  Charges: OT General Charges $OT Visit: 1 Visit OT Treatments $Therapeutic Activity: 23-37 mins  05/03/2023  AB, OTR/L  Acute Rehabilitation Services  Office: [phone]   Tristan Schroeder 05/03/2023, 3:45 PM

## 2023-05-03 NOTE — Hospital Course (Signed)
89 y.o. male with medical history significant for hypertension, hyperlipidemia, HFrEF, CVA, paroxysmal A-fib not anticoagulated who presented after a mechanical fall.  Uses a walker at baseline.  He fell after a turn with his walker.  He was in his usual state of health prior to this. In the ED he was found to have periprosthetic distal right femur fracture.  He was hospitalized for further management.  Underwent ORIF on 6/29.

## 2023-05-03 NOTE — Consult Note (Signed)
WOC Nurse Consult Note:  Reason for Consult: skin tears bilateral arms  Wound type: full thickness skin loss  Pressure Injury POA: NA  Measurement: 1.  Full thickness skin loss L upper arm x 2, superior 1 cm x 9 cm x 0.1 cm 100% red moist; distal 1 cm x 5 cm x 0.1 cm 100% red moist  2.  L posterior forearm 4 cm x 3 cm x 0.1 cm 100% red moist; R posterior forearm 2 cm x 2 cm x 0.1 cm 100% red moist partial flap in place   Drainage (amount, consistency, odor) moderate sanguinous  Periwound: bilateral arms with extensive bruising, patient states he is on an Aspirin daily and son mentions his dad has very thin skin and his arms are always painful to touch  Dressing procedure/placement/frequency: Clean skin tears to bilateral arms with NS, apply Mepitel as a contact layer [facility] [phone]) then Vaseline gauze on top of Mepitel. Cover with Telfa pad and wrap with Kerlix.  Change every other day. Soak dressing in NS if any portion if stuck to skin.  DO NOT USE SILICONE FOAMS.   Discussed POC with patient, son, and bedside nurse.  WOC team will not follow at this time. Re-consult if further needs arise.   Thank you,    Priscella Mann MSN, RN-BC, [employer] [phone]

## 2023-05-03 NOTE — Progress Notes (Signed)
Progress Note   Patient: Danny Brady AOZ:[id number] DOB: 07-16-1927 DOA: 04/22/2023     11 DOS: the patient was seen and examined on 05/03/2023   Brief hospital course: 89 y.o. male with medical history significant for hypertension, hyperlipidemia, HFrEF, CVA, paroxysmal A-fib not anticoagulated who presented after a mechanical fall.  Uses a walker at baseline.  He fell after a turn with his walker.  He was in his usual state of health prior to this. In the ED he was found to have periprosthetic distal right femur fracture.  He was hospitalized for further management.  Underwent ORIF on 6/29.   Assessment and Plan: COVID-19 infection Patient did have a cough last week but never had fever.  Chest x-ray did not show any opacities.  One of his caregivers who was spending time with him in the hospital tested positive for COVID-19.  Patient was tested and he also came back as positive.  No need for any therapeutics since patient has minimal symptoms.   Continue with cough medications.  Saturations are still normal on room air.  Denies any shortness of breath.   Periprosthetic distal right femur fracture, post mechanical fall. S/p ORIF.  Seems to be stable for the most part.  Seen by PT and OT.  Initially inpatient rehabilitation was being considered.  However insurance has denied this.  SNF pending Orthopedics recommends aspirin 81 mg twice daily at discharge.     Chronic normocytic anemia/acute blood loss anemia Drop in hemoglobin was due to postoperative blood loss.   Pt is s/p 3 units PRBC's this visit   Hyponatremia Urine osmolality 501.  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  Patient was also on furosemide prior to admission.  He seems to be fairly euvolemic.   Patient's furosemide was resumed.  He was started on salt tablets.  Sodium level improved from 127 to 131.  Check periodically.   Hypokalemia Continue to supplement as needed   Severe constipation Constipation resolved  after he was given enema. Continue bowel regimen.     Hyperglycemia HbA1c 5.5.   Paroxysmal A-fib  Rate is controlled. Not on anticoagulation Monitor on telemetry   AKI due to urinary retention Renal function has improved after Foley catheter was placed.  Continue with finasteride.  Voiding trial once he is more ambulatory.  Will need to be discharged with Foley catheter.   Chronic HFrEF Fairly euvolemic.  Continue to monitor volume status.   Chronic anxiety/depression Continue Zoloft. Experiencing some sundowning occasionally.  Reorient daily.  No focal neurological deficits.  UA unremarkable.   GERD Continue PPI      Subjective: Without complaints  Physical Exam: Vitals:   05/02/23 1720 05/02/23 1929 05/02/23 2039 05/03/23 0446  BP:  128/71 114/66 129/68  Pulse: 99 71 68 72  Resp: 20 18 16 15   Temp: 98 F (36.7 C) (!) 97.5 F (36.4 C) 98.6 F (37 C) 98.3 F (36.8 C)  TempSrc: Oral Oral Oral Oral  SpO2: 96% 98% 98% 97%  Weight:      Height:       General exam: Awake, laying in bed, in nad Respiratory system: Normal respiratory effort, no wheezing Cardiovascular system: regular rate, s1, s2 Gastrointestinal system: Soft, nondistended, positive BS Central nervous system: CN2-12 grossly intact, strength intact Extremities: Perfused, no clubbing Skin: Normal skin turgor, no notable skin lesions seen Psychiatry: Mood normal // no visual hallucinations   Data Reviewed:  Labs reviewed: Na 131, K 3.7, Cr 0.88, Hgb  9.1  Family Communication: Pt in room, family at bedside  Disposition: Status is: Inpatient Remains inpatient appropriate because: Severity of illness  Planned Discharge Destination: Skilled nursing facility     Author: Rickey Barbara, MD 05/03/2023 3:58 PM  For on call review [email or web address].

## 2023-05-04 ENCOUNTER — Inpatient Hospital Stay (HOSPITAL_COMMUNITY): Payer: Medicare PPO

## 2023-05-04 DIAGNOSIS — S72001A Fracture of unspecified part of neck of right femur, initial encounter for closed fracture: Secondary | ICD-10-CM | POA: Diagnosis not present

## 2023-05-04 LAB — CBC
HCT: 27.8 % — ABNORMAL LOW (ref 39.0–52.0)
Hemoglobin: 8.9 g/dL — ABNORMAL LOW (ref 13.0–17.0)
MCH: 30.2 pg (ref 26.0–34.0)
MCHC: 32 g/dL (ref 30.0–36.0)
MCV: 94.2 fL (ref 80.0–100.0)
Platelets: 187 10*3/uL (ref 150–400)
RBC: 2.95 MIL/uL — ABNORMAL LOW (ref 4.22–5.81)
RDW: 18.3 % — ABNORMAL HIGH (ref 11.5–15.5)
WBC: 4.5 10*3/uL (ref 4.0–10.5)
nRBC: 0 % (ref 0.0–0.2)

## 2023-05-04 LAB — COMPREHENSIVE METABOLIC PANEL
ALT: 9 U/L (ref 0–44)
AST: 18 U/L (ref 15–41)
Albumin: 2.7 g/dL — ABNORMAL LOW (ref 3.5–5.0)
Alkaline Phosphatase: 70 U/L (ref 38–126)
Anion gap: 9 (ref 5–15)
BUN: 9 mg/dL (ref 8–23)
CO2: 22 mmol/L (ref 22–32)
Calcium: 8.3 mg/dL — ABNORMAL LOW (ref 8.9–10.3)
Chloride: 100 mmol/L (ref 98–111)
Creatinine, Ser: 0.87 mg/dL (ref 0.61–1.24)
GFR, Estimated: >60 mL/min (ref >60)
Glucose, Bld: 100 mg/dL — ABNORMAL HIGH (ref 70–99)
Potassium: 3.3 mmol/L — ABNORMAL LOW (ref 3.5–5.1)
Sodium: 131 mmol/L — ABNORMAL LOW (ref 135–145)
Total Bilirubin: 2 mg/dL — ABNORMAL HIGH (ref 0.3–1.2)
Total Protein: 5.4 g/dL — ABNORMAL LOW (ref 6.5–8.1)

## 2023-05-04 LAB — GLUCOSE, CAPILLARY
Glucose-Capillary: 106 mg/dL — ABNORMAL HIGH (ref 70–99)
Glucose-Capillary: 128 mg/dL — ABNORMAL HIGH (ref 70–99)
Glucose-Capillary: 111 mg/dL — ABNORMAL HIGH (ref 70–99)
Glucose-Capillary: 105 mg/dL — ABNORMAL HIGH (ref 70–99)

## 2023-05-04 MED ORDER — POTASSIUM CHLORIDE CRYS ER 20 MEQ PO TBCR
60.0000 meq | EXTENDED_RELEASE_TABLET | Freq: Once | ORAL | Status: AC
  Administered 2023-05-04: 60 meq via ORAL
  Filled 2023-05-04: qty 3

## 2023-05-04 NOTE — Progress Notes (Signed)
Progress Note   Patient: Danny Brady ZOX:[id number] DOB: 26-Dec-1926 DOA: 04/22/2023     12 DOS: the patient was seen and examined on 05/04/2023   Brief hospital course: 89 y.o. male with medical history significant for hypertension, hyperlipidemia, HFrEF, CVA, paroxysmal A-fib not anticoagulated who presented after a mechanical fall.  Uses a walker at baseline.  He fell after a turn with his walker.  He was in his usual state of health prior to this. In the ED he was found to have periprosthetic distal right femur fracture.  He was hospitalized for further management.  Underwent ORIF on 6/29.   Assessment and Plan: COVID-19 infection Patient did have a cough last week but never had fever.  Chest x-ray did not show any opacities.  One of his caregivers who was spending time with him in the hospital tested positive for COVID-19.  Patient was tested and he also came back as positive.  No need for any therapeutics since patient has minimal symptoms.   Continue with cough medications. Remains on minimal O2 support Added flutter valve for secretions Pt actively coughing today. Will check repeat CXR for interval change   Periprosthetic distal right femur fracture, post mechanical fall. S/p ORIF.  Seems to be stable for the most part.  Seen by PT and OT.  Initially inpatient rehabilitation was being considered.  However insurance has denied this.  SNF currently pending Orthopedics recommends aspirin 81 mg twice daily at discharge.     Chronic normocytic anemia/acute blood loss anemia Drop in hemoglobin was due to postoperative blood loss.   Pt is s/p 3 units PRBC's this visit   Hyponatremia Urine osmolality 501.  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  Patient was also on furosemide prior to admission.  He seems to be fairly euvolemic.   Patient's furosemide was resumed.  He was started on salt tablets.  Na remains stable at 131   Hypokalemia Continue to supplement as needed   Severe  constipation Constipation resolved after he was given enema. Continue bowel regimen.     Hyperglycemia HbA1c 5.5.   Paroxysmal A-fib  Rate is controlled. Not on anticoagulation Monitor on telemetry   AKI due to urinary retention Renal function has improved after Foley catheter was placed.  Continue with finasteride.  Voiding trial once he is more ambulatory.  Will need to be discharged with Foley catheter.   Chronic HFrEF Fairly euvolemic.  Continue to monitor volume status.   Chronic anxiety/depression Continue Zoloft. Experiencing some sundowning occasionally.  Reorient daily.  No focal neurological deficits.  UA unremarkable.   GERD Continue PPI  Hypokalemia -Will replace -recheck bmet in AM      Subjective: Coughing with sensation of phlegm stuck in his throat  Physical Exam: Vitals:   05/03/23 0446 05/03/23 2016 05/04/23 0422 05/04/23 0858  BP: 129/68 139/76 133/73 131/77  Pulse: 72 76 66 66  Resp: 15 16 18 18   Temp: 98.3 F (36.8 C) 98.6 F (37 C)  98.7 F (37.1 C)  TempSrc: Oral Oral  Oral  SpO2: 97% 97% 96% 98%  Weight:      Height:       General exam: Conversant, in no acute distress Respiratory system: normal chest rise, clear, no audible wheezing, actively coughing Cardiovascular system: perfused, no notable JVD Gastrointestinal system: Nondistended, nontender, pos BS Central nervous system: No seizures, no tremors Extremities: No cyanosis, no joint deformities Skin: No rashes, no pallor Psychiatry: Affect normal // no auditory hallucinations  Data Reviewed:  Labs reviewed: Na 131, K 3.7, Cr 0.88, Hgb 9.1  Family Communication: Pt in room, family at bedside  Disposition: Status is: Inpatient Remains inpatient appropriate because: Severity of illness  Planned Discharge Destination: Skilled nursing facility     Author: Rickey Barbara, MD 05/04/2023 3:45 PM  For on call review [email or web address].

## 2023-05-05 DIAGNOSIS — S72001A Fracture of unspecified part of neck of right femur, initial encounter for closed fracture: Secondary | ICD-10-CM | POA: Diagnosis not present

## 2023-05-05 LAB — GLUCOSE, CAPILLARY
Glucose-Capillary: 102 mg/dL — ABNORMAL HIGH (ref 70–99)
Glucose-Capillary: 130 mg/dL — ABNORMAL HIGH (ref 70–99)
Glucose-Capillary: 117 mg/dL — ABNORMAL HIGH (ref 70–99)
Glucose-Capillary: 111 mg/dL — ABNORMAL HIGH (ref 70–99)

## 2023-05-05 NOTE — Progress Notes (Signed)
Occupational Therapy Treatment Patient Details Name: Danny Brady MRN: [id number] DOB: 04/13/1927 Today's Date: 05/05/2023   History of present illness Danny Brady is a 89 y.o. male who presented 6/28 after a mechanical fall resulting in a R periprosthetic distal femur fx; s/p ORIF 6/29, WBAT; PMH includes: hypertension, hyperlipidemia, HFrEF, CVA, paroxysmal A-fib not anticoagulated   OT comments  OT assisting pt with returning to bed from [facility], using this session to promote offloading BLEs from OT/PT session earlier. Pt needing mod A +2 for STS from lower BSC, still needs cues for anterior weight shifting. Pt RLE continues to buckle when attempting to offload LLE making mobility unsafe without +2. Total A for rear pericare while pt has Max A from someone to maintain balance, pt fatigues following steps to bedside. OT to continue progressing pt as able and address balance deficits. DC plans remain appropriate for SNF.    Recommendations for follow up therapy are one component of a multi-disciplinary discharge planning process, led by the attending physician.  Recommendations may be updated based on patient status, additional functional criteria and insurance authorization.    Assistance Recommended at Discharge Frequent or constant Supervision/Assistance  Patient can return home with the following  Two people to help with walking and/or transfers;A lot of help with bathing/dressing/bathroom;Assistance with cooking/housework;Assist for transportation;Help with stairs or ramp for entrance;Direct supervision/assist for financial management;Direct supervision/assist for medications management   Equipment Recommendations  None recommended by OT    Recommendations for Other Services      Precautions / Restrictions Precautions Precautions: Fall Precaution Comments: Very fragile skin Required Braces or Orthoses: Other Brace Other Brace: Has custom shoes for foot deformities and leg length  discrepancy Restrictions Weight Bearing Restrictions: Yes RLE Weight Bearing: Weight bearing as tolerated       Mobility Bed Mobility Overal bed mobility: Needs Assistance Bed Mobility: Sit to Supine Rolling: Max assist, Mod assist, +2 for physical assistance   Supine to sit: +2 for physical assistance, Max assist Sit to supine: Max assist   General bed mobility comments: Use of bed pads and HHA from therapists and son for trunk elevation and BLE management.    Transfers Overall transfer level: Needs assistance Equipment used: Rolling walker (2 wheels) Transfers: Sit to/from Stand, Bed to chair/wheelchair/BSC Sit to Stand: Mod assist, +2 physical assistance Stand pivot transfers: Max assist, +2 safety/equipment   Step pivot transfers: +2 physical assistance, Mod assist, +2 safety/equipment     General transfer comment: pt needing Mod A +2 for STS from [facility] despite cues to push off arm rests, having to steady balance before completing step pivot transfer     Balance Overall balance assessment: Needs assistance Sitting-balance support: Feet supported Sitting balance-Leahy Scale: Fair Sitting balance - Comments: able to sit on EOB Postural control: Posterior lean Standing balance support: Reliant on assistive device for balance, Bilateral upper extremity supported Standing balance-Leahy Scale: Poor Standing balance comment: RW reliant                           ADL either performed or assessed with clinical judgement   ADL Overall ADL's : Needs assistance/impaired                     Lower Body Dressing: Maximal assistance;Sitting/lateral leans Lower Body Dressing Details (indicate cue type and reason): to don/doff shoes Toilet Transfer: BSC/3in1;Stand-pivot;Maximal assistance           Functional  mobility during ADLs: Moderate assistance;Rolling walker (2 wheels);+2 for physical assistance;+2 for safety/equipment (steps at beside) General ADL  Comments: Pt on BSC at start of OT session, OT using this session to promote carryover of STS mechanics and promote gait while assisting pt wtih getting back to bed    Extremity/Trunk Assessment              Vision       Perception     Praxis      Cognition Arousal/Alertness: Awake/alert Behavior During Therapy: WFL for tasks assessed/performed Overall Cognitive Status: Within Functional Limits for tasks assessed                                          Exercises      Shoulder Instructions       General Comments VSS    Pertinent Vitals/ Pain       Pain Assessment Pain Assessment: No/denies pain  Home Living                                          Prior Functioning/Environment              Frequency  Min 2X/week        Progress Toward Goals  OT Goals(current goals can now be found in the care plan section)  Progress towards OT goals: Progressing toward goals  Acute Rehab OT Goals Patient Stated Goal: To be able to pivot into a wheelchair OT Goal Formulation: With family Time For Goal Achievement: 05/08/23 Potential to Achieve Goals: Good  Plan Discharge plan remains appropriate;Frequency remains appropriate    Co-evaluation    PT/OT/SLP Co-Evaluation/Treatment: Yes Reason for Co-Treatment: Complexity of the patient's impairments (multi-system involvement);For patient/therapist safety PT goals addressed during session: Mobility/safety with mobility;Balance;Proper use of DME;Strengthening/ROM OT goals addressed during session: ADL's and self-care;Proper use of Adaptive equipment and DME      AM-PAC OT "6 Clicks" Daily Activity     Outcome Measure   Help from another person eating meals?: None Help from another person taking care of personal grooming?: A Little Help from another person toileting, which includes using toliet, bedpan, or urinal?: A Lot Help from another person bathing (including washing,  rinsing, drying)?: A Lot Help from another person to put on and taking off regular upper body clothing?: A Little Help from another person to put on and taking off regular lower body clothing?: A Lot 6 Click Score: 16    End of Session Equipment Utilized During Treatment: Gait belt;Rolling walker (2 wheels)  OT Visit Diagnosis: Unsteadiness on feet (R26.81);Other abnormalities of gait and mobility (R26.89);History of falling (Z91.81)   Activity Tolerance Patient tolerated treatment well   Patient Left in bed;with call bell/phone within reach;with family/visitor present   Nurse Communication Mobility status        Time: 1445-1501 OT Time Calculation (min): 16 min  Charges: OT General Charges $OT Visit: 1 Visit OT Treatments $Therapeutic Activity: 8-22 mins  05/05/2023  AB, OTR/L  Acute Rehabilitation Services  Office: [phone]   Tristan Schroeder 05/05/2023, 3:31 PM

## 2023-05-05 NOTE — TOC Progression Note (Addendum)
Transition of Care [facility]) - Progression Note    Patient Details  Name: Danny Brady MRN: [id number] Date of Birth: [birth year]  [facility] of [facility]) CM/SW Contact  Erin Sons, [place] Phone Number: 05/05/2023, 11:00 AM  Clinical Narrative:     CSW spoke with [facility] They won't have bed for pt until Wednesday now.   CSW spoke with both son's Trey Paula and Arlys John. Updated them on facilities in [place] area would have same covid requirement as [facility], after 10 day isolation period. [facility] is still their preference.   Expected Discharge Plan: Skilled Nursing Facility Barriers to Discharge: SNF Covid  Expected Discharge Plan and Services In-house Referral: Clinical Social Work   Post Acute Care Choice: Skilled Nursing Facility Living arrangements for the past 2 months: Single Family Home                                       Social Determinants of Health (SDOH) Interventions SDOH Screenings   Food Insecurity: No Food Insecurity (04/22/2023)  Housing: Low Risk  (04/22/2023)  Transportation Needs: No Transportation Needs (04/22/2023)  Utilities: Not At Risk (04/22/2023)  Tobacco Use: Low Risk  (04/22/2023)    Readmission Risk Interventions     No data to display

## 2023-05-05 NOTE — Progress Notes (Signed)
Occupational Therapy Treatment Patient Details Name: Danny Brady MRN: [id number] DOB: Nov 22, 1926 Today's Date: 05/05/2023   History of present illness Danny Brady is a 89 y.o. male who presented 6/28 after a mechanical fall resulting in a R periprosthetic distal femur fx; s/p ORIF 6/29, WBAT; PMH includes: hypertension, hyperlipidemia, HFrEF, CVA, paroxysmal A-fib not anticoagulated   OT comments  Pt continuing to have trouble offloading BLEs. Focused session on promoting weight shifts standing at bedside then offloading BLEs with emphasis on controlled steps with LLE. Pt Min A +2 to stand from elevated surface but needs mod A +2 to take step for safety and to provide physical assist. Pt positioned on bed pan at end of OT session needing increased time for toileting but OT outside doorway to assist with returning pt if needed. OT to continue to progress pt as able. DC plans remain appropriate for SNF   Recommendations for follow up therapy are one component of a multi-disciplinary discharge planning process, led by the attending physician.  Recommendations may be updated based on patient status, additional functional criteria and insurance authorization.    Assistance Recommended at Discharge Frequent or constant Supervision/Assistance  Patient can return home with the following  Two people to help with walking and/or transfers;A lot of help with bathing/dressing/bathroom;Assistance with cooking/housework;Assist for transportation;Help with stairs or ramp for entrance;Direct supervision/assist for financial management;Direct supervision/assist for medications management   Equipment Recommendations  None recommended by OT    Recommendations for Other Services      Precautions / Restrictions Precautions Precautions: Fall Precaution Comments: Very fragile skin Required Braces or Orthoses: Other Brace Other Brace: Has custom shoes for foot deformities and leg length  discrepancy Restrictions Weight Bearing Restrictions: Yes RLE Weight Bearing: Weight bearing as tolerated       Mobility Bed Mobility Overal bed mobility: Needs Assistance Bed Mobility: Supine to Sit Rolling: Max assist, Mod assist, +2 for physical assistance   Supine to sit: +2 for physical assistance, Max assist Sit to supine: Max assist   General bed mobility comments: Use of bed pads and HHA from therapists and son for trunk elevation and BLE management.    Transfers Overall transfer level: Needs assistance Equipment used: Rolling walker (2 wheels) Transfers: Sit to/from Stand, Bed to chair/wheelchair/BSC Sit to Stand: +2 physical assistance, Min assist, From elevated surface, +2 safety/equipment Stand pivot transfers: Max assist, +2 safety/equipment         General transfer comment: RLE buckles. Practice weight shifting and marching in place. Pt likes to pull on RW with son holding it in place. Pt able to take some small shuffling steps.     Balance Overall balance assessment: Needs assistance Sitting-balance support: Feet supported Sitting balance-Leahy Scale: Fair Sitting balance - Comments: able to sit on EOB Postural control: Posterior lean Standing balance support: Reliant on assistive device for balance, Bilateral upper extremity supported Standing balance-Leahy Scale: Poor Standing balance comment: RW reliant                           ADL either performed or assessed with clinical judgement   ADL Overall ADL's : Needs assistance/impaired                     Lower Body Dressing: Maximal assistance;Sitting/lateral leans Lower Body Dressing Details (indicate cue type and reason): to don/doff shoes Toilet Transfer: BSC/3in1;Stand-pivot;Maximal assistance           Functional  mobility during ADLs: Moderate assistance;Rolling walker (2 wheels);+2 for physical assistance;+2 for safety/equipment (steps at beside) General ADL Comments:  Focused OT session on promoting weight shifts and gathering balance with normal reciprocal gait pattern    Extremity/Trunk Assessment              Vision       Perception     Praxis      Cognition Arousal/Alertness: Awake/alert Behavior During Therapy: WFL for tasks assessed/performed Overall Cognitive Status: Within Functional Limits for tasks assessed                                          Exercises      Shoulder Instructions       General Comments VSS    Pertinent Vitals/ Pain       Pain Assessment Pain Assessment: No/denies pain  Home Living                                          Prior Functioning/Environment              Frequency  Min 2X/week        Progress Toward Goals  OT Goals(current goals can now be found in the care plan section)  Progress towards OT goals: Progressing toward goals  Acute Rehab OT Goals OT Goal Formulation: With family Time For Goal Achievement: 05/08/23 Potential to Achieve Goals: Good  Plan Discharge plan remains appropriate;Frequency remains appropriate    Co-evaluation    PT/OT/SLP Co-Evaluation/Treatment: Yes Reason for Co-Treatment: Complexity of the patient's impairments (multi-system involvement);For patient/therapist safety PT goals addressed during session: Mobility/safety with mobility;Balance;Proper use of DME;Strengthening/ROM OT goals addressed during session: ADL's and self-care;Proper use of Adaptive equipment and DME      AM-PAC OT "6 Clicks" Daily Activity     Outcome Measure   Help from another person eating meals?: None Help from another person taking care of personal grooming?: A Little Help from another person toileting, which includes using toliet, bedpan, or urinal?: A Lot Help from another person bathing (including washing, rinsing, drying)?: A Lot Help from another person to put on and taking off regular upper body clothing?: A Little Help  from another person to put on and taking off regular lower body clothing?: A Lot 6 Click Score: 16    End of Session Equipment Utilized During Treatment: Gait belt;Rollator (4 wheels)  OT Visit Diagnosis: Unsteadiness on feet (R26.81);Other abnormalities of gait and mobility (R26.89);History of falling (Z91.81)   Activity Tolerance Patient tolerated treatment well   Patient Left with family/visitor present;Other (comment) (on [facility])   Nurse Communication Mobility status        Time: [phone] OT Time Calculation (min): 28 min  Charges: OT General Charges $OT Visit: 1 Visit OT Treatments $Therapeutic Activity: 8-22 mins  05/05/2023  AB, OTR/L  Acute Rehabilitation Services  Office: [phone]   Tristan Schroeder 05/05/2023, 3:23 PM

## 2023-05-05 NOTE — Progress Notes (Signed)
Physical Therapy Treatment Patient Details Name: Danny Brady MRN: [id number] DOB: 1927-10-15 Today's Date: 05/05/2023   History of Present Illness Danny Brady is a 89 y.o. male who presented 6/28 after a mechanical fall resulting in a R periprosthetic distal femur fx; s/p ORIF 6/29, WBAT; PMH includes: hypertension, hyperlipidemia, HFrEF, CVA, paroxysmal A-fib not anticoagulated    PT Comments  Pt tolerated treatment well today. Co treat with OT. Pt was able to stand with RW +2 Min A and practice weight shifting and standing marches before transferring to [facility] for BM with +2 Max A. No change in DC/DME recs at this time. PT will continue to follow.    Assistance Recommended at Discharge Frequent or constant Supervision/Assistance  If plan is discharge home, recommend the following:  Can travel by private vehicle    A lot of help with walking and/or transfers;Two people to help with walking and/or transfers;Two people to help with bathing/dressing/bathroom;Assistance with cooking/housework;Assist for transportation;Help with stairs or ramp for entrance   No  Equipment Recommendations  Other (comment) (Per accepting facility)    Recommendations for Other Services       Precautions / Restrictions Precautions Precautions: Fall Precaution Comments: Very fragile skin Required Braces or Orthoses: Other Brace Other Brace: Has custom shoes for foot deformities and leg length discrepancy Restrictions Weight Bearing Restrictions: Yes RLE Weight Bearing: Weight bearing as tolerated     Mobility  Bed Mobility Overal bed mobility: Needs Assistance Bed Mobility: Supine to Sit     Supine to sit: +2 for physical assistance, Max assist     General bed mobility comments: Use of bed pads and HHA from therapists and son for trunk elevation and BLE management.    Transfers Overall transfer level: Needs assistance Equipment used: Rolling walker (2 wheels) Transfers: Sit to/from  Stand, Bed to chair/wheelchair/BSC Sit to Stand: +2 physical assistance, Min assist, From elevated surface, +2 safety/equipment Stand pivot transfers: Max assist, +2 safety/equipment         General transfer comment: RLE buckles. Practice weight shifting and marching in place. Pt likes to pull on RW with son holding it in place. Pt able to take some small shuffling steps.    Ambulation/Gait                   Stairs             Wheelchair Mobility     Tilt Bed    Modified Rankin (Stroke Patients Only)       Balance Overall balance assessment: Needs assistance Sitting-balance support: Feet supported Sitting balance-Leahy Scale: Fair Sitting balance - Comments: able to sit on EOB Postural control: Posterior lean Standing balance support: Reliant on assistive device for balance, Bilateral upper extremity supported Standing balance-Leahy Scale: Poor Standing balance comment: RW reliant                            Cognition Arousal/Alertness: Awake/alert Behavior During Therapy: WFL for tasks assessed/performed Overall Cognitive Status: Within Functional Limits for tasks assessed                                 General Comments: pleasant, likes to joke        Exercises      General Comments General comments (skin integrity, edema, etc.): VSS on RA      Pertinent Vitals/Pain Pain Assessment  Pain Assessment: No/denies pain    Home Living                          Prior Function            PT Goals (current goals can now be found in the care plan section) Progress towards PT goals: Progressing toward goals    Frequency    Min 5X/week      PT Plan Current plan remains appropriate    Co-evaluation PT/OT/SLP Co-Evaluation/Treatment: Yes Reason for Co-Treatment: Complexity of the patient's impairments (multi-system involvement);For patient/therapist safety PT goals addressed during session:  Mobility/safety with mobility;Balance;Proper use of DME;Strengthening/ROM OT goals addressed during session: ADL's and self-care      AM-PAC PT "6 Clicks" Mobility   Outcome Measure  Help needed turning from your back to your side while in a flat bed without using bedrails?: A Lot Help needed moving from lying on your back to sitting on the side of a flat bed without using bedrails?: A Lot Help needed moving to and from a bed to a chair (including a wheelchair)?: A Lot Help needed standing up from a chair using your arms (e.g., wheelchair or bedside chair)?: A Lot Help needed to walk in hospital room?: Total Help needed climbing 3-5 steps with a railing? : Total 6 Click Score: 10    End of Session Equipment Utilized During Treatment: Gait belt (Pt has a custom gait belt due to skin integrity) Activity Tolerance: Patient tolerated treatment well;Other (comment) (Treatment due to pt needing to have BM) Patient left: with call bell/phone within reach;with family/visitor present;Other (comment) (On BSC) Nurse Communication: Mobility status PT Visit Diagnosis: Unsteadiness on feet (R26.81);Other abnormalities of gait and mobility (R26.89);History of falling (Z91.81);Pain;Muscle weakness (generalized) (M62.81)     Time: 1610-9604 PT Time Calculation (min) (ACUTE ONLY): 32 min  Charges:    $Therapeutic Activity: 8-22 mins PT General Charges $$ ACUTE PT VISIT: 1 Visit                     [name], PT, DPT Acute Rehab Services [phone]    [name] 05/05/2023, 2:46 PM

## 2023-05-05 NOTE — Progress Notes (Signed)
Progress Note   Patient: Danny Brady VWU:[id number] DOB: 1927/08/13 DOA: 04/22/2023     13 DOS: the patient was seen and examined on 05/05/2023   Brief hospital course: 89 y.o. male with medical history significant for hypertension, hyperlipidemia, HFrEF, CVA, paroxysmal A-fib not anticoagulated who presented after a mechanical fall.  Uses a walker at baseline.  He fell after a turn with his walker.  He was in his usual state of health prior to this. In the ED he was found to have periprosthetic distal right femur fracture.  He was hospitalized for further management.  Underwent ORIF on 6/29.   Assessment and Plan: COVID-19 infection Patient did have a cough last week but never had fever.  Chest x-ray did not show any opacities.  One of his caregivers who was spending time with him in the hospital tested positive for COVID-19.  Patient was tested and he also came back as positive.  No need for any therapeutics since patient has minimal symptoms.   Continue with cough medications. Remains on minimal O2 support Continue flutter valve for secretions Pt reports feeling somewhat better today   Periprosthetic distal right femur fracture, post mechanical fall. S/p ORIF.  Seems to be stable for the most part.  Seen by PT and OT.  Initially inpatient rehabilitation was being considered.  However insurance has denied this.  SNF currently pending Orthopedics recommends aspirin 81 mg twice daily at discharge.     Chronic normocytic anemia/acute blood loss anemia Drop in hemoglobin was due to postoperative blood loss.   Pt is s/p 3 units PRBC's this visit   Hyponatremia Urine osmolality 501.  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  Patient was also on furosemide prior to admission.  He seems to be fairly euvolemic.   Patient's furosemide was resumed.  He was started on salt tablets.  Na remains stable   Hypokalemia Continue to supplement as needed   Severe constipation Constipation  resolved after he was given enema. Continue bowel regimen.     Hyperglycemia HbA1c 5.5.   Paroxysmal A-fib  Rate is controlled. Not on anticoagulation Monitor on telemetry   AKI due to urinary retention Renal function has improved after Foley catheter was placed.  Continue with finasteride.  Voiding trial once he is more ambulatory.  Will need to be discharged with Foley catheter.   Chronic HFrEF Fairly euvolemic.  Continue to monitor volume status.   Chronic anxiety/depression Continue Zoloft. Experiencing some sundowning occasionally.  Reorient daily.  No focal neurological deficits.  UA unremarkable.   GERD Continue PPI  Hypokalemia -replaced      Subjective: Reports feeling somewhat better  Physical Exam: Vitals:   05/04/23 1613 05/04/23 1927 05/05/23 0526 05/05/23 0728  BP: 138/63 136/76 128/64   Pulse: 64 63 (!) 57   Resp: 17 16 16    Temp: (!) 97.5 F (36.4 C) 98.7 F (37.1 C) 98.7 F (37.1 C)   TempSrc:  Oral Oral   SpO2: 97% 97% 96%   Weight:    100.1 kg  Height:       General exam: Awake, laying in bed, in nad Respiratory system: Normal respiratory effort, no wheezing Cardiovascular system: regular rate, s1, s2 Gastrointestinal system: Soft, nondistended, positive BS Central nervous system: CN2-12 grossly intact, strength intact Extremities: Perfused, no clubbing Skin: Normal skin turgor, no notable skin lesions seen Psychiatry: Mood normal // no visual hallucinations   Data Reviewed:  There are no new results to review at this  time.  Family Communication: Pt in room, family at bedside  Disposition: Status is: Inpatient Remains inpatient appropriate because: Severity of illness  Planned Discharge Destination: Skilled nursing facility     Author: Rickey Barbara, MD 05/05/2023 5:06 PM  For on call review [email or web address].

## 2023-05-06 DIAGNOSIS — S72001A Fracture of unspecified part of neck of right femur, initial encounter for closed fracture: Secondary | ICD-10-CM | POA: Diagnosis not present

## 2023-05-06 LAB — GLUCOSE, CAPILLARY
Glucose-Capillary: 113 mg/dL — ABNORMAL HIGH (ref 70–99)
Glucose-Capillary: 134 mg/dL — ABNORMAL HIGH (ref 70–99)
Glucose-Capillary: 123 mg/dL — ABNORMAL HIGH (ref 70–99)
Glucose-Capillary: 102 mg/dL — ABNORMAL HIGH (ref 70–99)
Glucose-Capillary: 96 mg/dL (ref 70–99)

## 2023-05-06 MED ORDER — SODIUM CHLORIDE 0.9 % IV SOLN
2.0000 g | INTRAVENOUS | Status: DC
  Administered 2023-05-06 – 2023-05-09 (×4): 2 g via INTRAVENOUS
  Filled 2023-05-06 (×4): qty 20

## 2023-05-06 MED ORDER — MELATONIN 5 MG PO TABS
5.0000 mg | ORAL_TABLET | Freq: Every day | ORAL | Status: DC
  Administered 2023-05-06 – 2023-05-09 (×4): 5 mg via ORAL
  Filled 2023-05-06 (×4): qty 1

## 2023-05-06 MED ORDER — AZITHROMYCIN 250 MG PO TABS
500.0000 mg | ORAL_TABLET | Freq: Every day | ORAL | Status: AC
  Administered 2023-05-06 – 2023-05-10 (×5): 500 mg via ORAL
  Filled 2023-05-06 (×5): qty 2

## 2023-05-06 NOTE — Plan of Care (Signed)
  Problem: Education: Goal: Knowledge of General Education information will improve Description: Including pain rating scale, medication(s)/side effects and non-pharmacologic comfort measures Outcome: Progressing   Problem: Clinical Measurements: Goal: Respiratory complications will improve Outcome: Progressing   

## 2023-05-06 NOTE — Progress Notes (Signed)
Progress Note   Patient: Danny Brady ZOX:[id number] DOB: 30-Jul-1927 DOA: 04/22/2023     14 DOS: the patient was seen and examined on 05/06/2023   Brief hospital course: 89 y.o. male with medical history significant for hypertension, hyperlipidemia, HFrEF, CVA, paroxysmal A-fib not anticoagulated who presented after a mechanical fall.  Uses a walker at baseline.  He fell after a turn with his walker.  He was in his usual state of health prior to this. In the ED he was found to have periprosthetic distal right femur fracture.  He was hospitalized for further management.  Underwent ORIF on 6/29.   Assessment and Plan: COVID-19 infection Patient did have a cough last week but never had fever.  Chest x-ray did not show any opacities.  One of his caregivers who was spending time with him in the hospital tested positive for COVID-19.  Patient was tested and he also came back as positive.  No need for any therapeutics since patient has minimal symptoms.   Continue with cough medications. Remains on minimal O2 support Continue flutter valve for secretions Pt reports feeling somewhat better today  Post viral PNA -Recently noted to have increased cough and sputum production -Recent CXR reviewed with increase in interstitial markings on R lower lung fields -Will treat with 5 days of rocephin and azithro   Periprosthetic distal right femur fracture, post mechanical fall. S/p ORIF.  Seems to be stable for the most part.  Seen by PT and OT.  Initially inpatient rehabilitation was being considered.  However insurance has denied this.  SNF currently pending Orthopedics recommends aspirin 81 mg twice daily at discharge.     Chronic normocytic anemia/acute blood loss anemia Drop in hemoglobin was due to postoperative blood loss.   Pt is s/p 3 units PRBC's this visit -Hgb remains stable   Hyponatremia Urine osmolality 501.  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  Patient was also on furosemide  prior to admission.  He seems to be fairly euvolemic.   Patient's furosemide was resumed.  He was started on salt tablets.  Na remains stable   Hypokalemia Continue to supplement as needed   Severe constipation Constipation resolved after he was given enema. Continue bowel regimen.     Hyperglycemia HbA1c 5.5.   Paroxysmal A-fib  Rate is controlled. Not on anticoagulation Monitor on telemetry   AKI due to urinary retention Renal function has improved after Foley catheter was placed.  Continue with finasteride.  Voiding trial once he is more ambulatory.  Will need to be discharged with Foley catheter.   Chronic HFrEF Fairly euvolemic.  Continue to monitor volume status.   Chronic anxiety/depression Continue Zoloft. Experiencing some sundowning occasionally.  Reorient daily.  No focal neurological deficits.  UA unremarkable.   GERD Continue PPI  Hypokalemia -recently replaced      Subjective: Claims to have improved coughing, however caregiver at bedside reports pt has been coughing more  Physical Exam: Vitals:   05/05/23 0728 05/05/23 1945 05/06/23 0421 05/06/23 0906  BP:  136/73 (!) 118/57 127/60  Pulse:  61 61 79  Resp:  17 16 18   Temp:  99 F (37.2 C) 98.1 F (36.7 C) 98.2 F (36.8 C)  TempSrc:    Oral  SpO2:  97% 97% 96%  Weight: 100.1 kg  100.3 kg   Height:       General exam: Conversant, in no acute distress Respiratory system: normal chest rise, clear, no audible wheezing Cardiovascular system: perfused,  no notable JVD Gastrointestinal system: Nondistended, nontender Central nervous system: No seizures, no tremors Extremities: No cyanosis, no joint deformities Skin: No rashes, no pallor Psychiatry: Affect normal // no auditory hallucinations   Data Reviewed:  There are no new results to review at this time.  Family Communication: Pt in room, family not at bedside  Disposition: Status is: Inpatient Remains inpatient appropriate because:  Severity of illness  Planned Discharge Destination: Skilled nursing facility     Author: Rickey Barbara, MD 05/06/2023 5:25 PM  For on call review [email or web address].

## 2023-05-07 ENCOUNTER — Inpatient Hospital Stay (HOSPITAL_COMMUNITY): Payer: Medicare PPO

## 2023-05-07 DIAGNOSIS — S72001A Fracture of unspecified part of neck of right femur, initial encounter for closed fracture: Secondary | ICD-10-CM | POA: Diagnosis not present

## 2023-05-07 LAB — COMPREHENSIVE METABOLIC PANEL
ALT: 9 U/L (ref 0–44)
AST: 15 U/L (ref 15–41)
Albumin: 2.8 g/dL — ABNORMAL LOW (ref 3.5–5.0)
Alkaline Phosphatase: 75 U/L (ref 38–126)
Anion gap: 9 (ref 5–15)
BUN: 8 mg/dL (ref 8–23)
CO2: 22 mmol/L (ref 22–32)
Calcium: 8.3 mg/dL — ABNORMAL LOW (ref 8.9–10.3)
Chloride: 101 mmol/L (ref 98–111)
Creatinine, Ser: 0.94 mg/dL (ref 0.61–1.24)
GFR, Estimated: >60 mL/min (ref >60)
Glucose, Bld: 98 mg/dL (ref 70–99)
Potassium: 2.8 mmol/L — ABNORMAL LOW (ref 3.5–5.1)
Sodium: 132 mmol/L — ABNORMAL LOW (ref 135–145)
Total Bilirubin: 1.4 mg/dL — ABNORMAL HIGH (ref 0.3–1.2)
Total Protein: 5.3 g/dL — ABNORMAL LOW (ref 6.5–8.1)

## 2023-05-07 LAB — GLUCOSE, CAPILLARY
Glucose-Capillary: 103 mg/dL — ABNORMAL HIGH (ref 70–99)
Glucose-Capillary: 90 mg/dL (ref 70–99)
Glucose-Capillary: 117 mg/dL — ABNORMAL HIGH (ref 70–99)
Glucose-Capillary: 102 mg/dL — ABNORMAL HIGH (ref 70–99)

## 2023-05-07 LAB — CBC
HCT: 29.2 % — ABNORMAL LOW (ref 39.0–52.0)
Hemoglobin: 9.3 g/dL — ABNORMAL LOW (ref 13.0–17.0)
MCH: 30.4 pg (ref 26.0–34.0)
MCHC: 31.8 g/dL (ref 30.0–36.0)
MCV: 95.4 fL (ref 80.0–100.0)
Platelets: 229 10*3/uL (ref 150–400)
RBC: 3.06 MIL/uL — ABNORMAL LOW (ref 4.22–5.81)
RDW: 17.7 % — ABNORMAL HIGH (ref 11.5–15.5)
WBC: 5 10*3/uL (ref 4.0–10.5)
nRBC: 0 % (ref 0.0–0.2)

## 2023-05-07 LAB — MAGNESIUM: Magnesium: 1.7 mg/dL (ref 1.7–2.4)

## 2023-05-07 LAB — HIV ANTIBODY (ROUTINE TESTING W REFLEX): HIV Screen 4th Generation wRfx: NONREACTIVE

## 2023-05-07 MED ORDER — POTASSIUM CHLORIDE CRYS ER 20 MEQ PO TBCR
40.0000 meq | EXTENDED_RELEASE_TABLET | ORAL | Status: AC
  Administered 2023-05-07 (×3): 40 meq via ORAL
  Filled 2023-05-07 (×3): qty 2

## 2023-05-07 MED ORDER — FLUTICASONE PROPIONATE 50 MCG/ACT NA SUSP
1.0000 | Freq: Every day | NASAL | Status: DC
  Administered 2023-05-07 – 2023-05-10 (×4): 1 via NASAL
  Filled 2023-05-07: qty 16

## 2023-05-07 NOTE — Progress Notes (Signed)
15 Days Post-Op  Procedure(s) (LRB): OPEN REDUCTION INTERNAL FIXATION (ORIF) PERIPROSTHETIC FRACTURE (Right)  Subjective: Patient reports pain as mild to none.  Tolerating diet.  Some intermittent difficulty with urination/constipation pre-surgery. Continues to mobilize with PT/OT, though still with some reported weakness with >50% WB of RLE.  No CP, SOB, fevers, N/V, abdominal pain.  Recently tested positive for covid.  Pending DC to SNF.  Family members at bedside.  Mild anemia post-operatively, has received 3 unites PRBCs this visit.  Pt on lovenox, being monitored per medicine.  Hgb stable over last few days.  Objective:   VITALS:   Vitals:   05/06/23 1700 05/06/23 1932 05/07/23 0517 05/07/23 0517  BP: 120/80 125/68 116/76 116/76  Pulse: 62 78 (!) 56 67  Resp: 18 18 16 16   Temp: 98 F (36.7 C) 97.6 F (36.4 C)    TempSrc: Oral Oral    SpO2: 97% 98% 97% 96%  Weight:      Height:          Latest Ref Rng & Units 05/07/2023    2:26 AM 05/04/2023    3:45 AM 05/03/2023    6:16 AM  CBC  WBC 4.0 - 10.5 K/uL 5.0  4.5  4.2   Hemoglobin 13.0 - 17.0 g/dL 9.3  8.9  9.1   Hematocrit 39.0 - 52.0 % 29.2  27.8  28.1   Platelets 150 - 400 K/uL 229  187  190       Latest Ref Rng & Units 05/07/2023    2:26 AM 05/04/2023    3:45 AM 05/03/2023    6:16 AM  BMP  Glucose 70 - 99 mg/dL 98  478  295   BUN 8 - 23 mg/dL 8  9  10    Creatinine 0.61 - 1.24 mg/dL 6.21  3.08  6.57   Sodium 135 - 145 mmol/L 132  131  131   Potassium 3.5 - 5.1 mmol/L 2.8  3.3  3.7   Chloride 98 - 111 mmol/L 101  100  100   CO2 22 - 32 mmol/L 22  22  22    Calcium 8.9 - 10.3 mg/dL 8.3  8.3  8.3    Intake/Output      07/13 0701 07/14 0700 07/14 0701 07/15 0700   P.O. 480    IV Piggyback 100.1    Total Intake(mL/kg) 580.1 (5.8)    Urine (mL/kg/hr) 1650 (0.7)    Stool 0    Total Output 1650    Net -1069.9         Stool Occurrence 2 x       Physical Exam: General: NAD.  Sititng up in bedside chair,  calm, comfortable Resp: No increased wob Cardio: regular rate and rhythm ABD soft Neurologically intact MSK Neurovascularly intact Sensation intact distally Intact pulses distally Dorsiflexion/Plantar flexion intact Incision: dressing C/D/I   X-Rays 05/07/23 -- stable post-operative imaging without adverse features   Assessment: 15 Days Post-Op   S/P Procedure(s) (LRB): OPEN REDUCTION INTERNAL FIXATION (ORIF) PERIPROSTHETIC FRACTURE (Right)  Principal Problem:   Femur fracture, right (HCC) Active Problems:   Paroxysmal atrial fibrillation (HCC)   HFrEF (heart failure with reduced ejection fraction) (HCC)   Plan:  Advance diet Up with therapy Incentive Spirometry Elevate and Apply ice  Post op recs: WB: WBAT RLE Abx: ancef x23 hours post op Imaging: PACU xrays, and stable 2 week post-operative imaging Dressing: dressing/sutures removed, steri-strips applied, wound can be left open to air though still  no submersion of wound DVT prophylaxis: lovenox in house, dc on aspirin 81mg  BID Follow up: 4 weeks after surgery for a wound check and repeat x-rays with Dr. Blanchie Dessert at [facility].  DC: anticipate potential SNF placement, possibly this Wednesday Address: [address], [place], [place] [address]  Office Phone: [phone]   Cecil Cobbs, [place] Office [phone] 05/07/2023, 7:58 AM

## 2023-05-07 NOTE — Progress Notes (Signed)
Progress Note   Patient: MARIANNE OW ZOX:[id number] DOB: 1926/12/12 DOA: 04/22/2023     15 DOS: the patient was seen and examined on 05/07/2023   Brief hospital course: 89 y.o. male with medical history significant for hypertension, hyperlipidemia, HFrEF, CVA, paroxysmal A-fib not anticoagulated who presented after a mechanical fall.  Uses a walker at baseline.  He fell after a turn with his walker.  He was in his usual state of health prior to this. In the ED he was found to have periprosthetic distal right femur fracture.  He was hospitalized for further management.  Underwent ORIF on 6/29.   Assessment and Plan: COVID-19 infection Patient did have a cough last week but never had fever.  Chest x-ray did not show any opacities.  One of his caregivers who was spending time with him in the hospital tested positive for COVID-19.  Patient was tested and he also came back as positive.  No need for any therapeutics since patient has minimal symptoms.   Continue with cough medications. Remains on minimal O2 support Continue flutter valve for secretions  Post viral PNA -Recently noted to have increased cough and sputum production -Recent CXR reviewed with increase in interstitial markings on R lower lung fields -Will treat with 5 days of rocephin and azithro -reports cough has improved since starting abx   Periprosthetic distal right femur fracture, post mechanical fall. S/p ORIF.  Seems to be stable for the most part.  Seen by PT and OT.  Initially inpatient rehabilitation was being considered.  However insurance has denied this.  SNF currently pending Orthopedics recommends aspirin 81 mg twice daily at discharge.     Chronic normocytic anemia/acute blood loss anemia Drop in hemoglobin was due to postoperative blood loss.   Pt is s/p 3 units PRBC's this visit -Hgb remains stable   Hyponatremia Urine osmolality 501.  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  Patient was also on  furosemide prior to admission.  He seems to be fairly euvolemic.   Patient's furosemide was resumed.  He was started on salt tablets.  Na remains stable   Hypokalemia Low at 2.8 Replaced Recheck bmet in AM   Severe constipation Constipation resolved after he was given enema. Continue bowel regimen.     Hyperglycemia HbA1c 5.5.   Paroxysmal A-fib  Rate is controlled. Not on anticoagulation Monitor on telemetry   AKI due to urinary retention Renal function has improved after Foley catheter was placed.  Continue with finasteride.  Voiding trial once he is more ambulatory.  Will need to be discharged with Foley catheter.   Chronic HFrEF Fairly euvolemic.  Continue to monitor volume status.   Chronic anxiety/depression Continue Zoloft. Experiencing some sundowning occasionally.  Reorient daily.  No focal neurological deficits.  UA unremarkable.   GERD Continue PPI  Hypokalemia -will replace. Remains low -Recheck bmet in AM      Subjective: Reports feeling better with improved cough  Physical Exam: Vitals:   05/06/23 1932 05/07/23 0517 05/07/23 0517 05/07/23 1548  BP: 125/68 116/76 116/76 128/62  Pulse: 78 (!) 56 67   Resp: 18 16 16 15   Temp: 97.6 F (36.4 C)   98.2 F (36.8 C)  TempSrc: Oral     SpO2: 98% 97% 96% 94%  Weight:      Height:       General exam: Awake, laying in bed, in nad Respiratory system: Normal respiratory effort, no wheezing Cardiovascular system: regular rate, s1, s2 Gastrointestinal system:  Soft, nondistended, positive BS Central nervous system: CN2-12 grossly intact, strength intact Extremities: Perfused, no clubbing Skin: Normal skin turgor, no notable skin lesions seen Psychiatry: Mood normal // no visual hallucinations   Data Reviewed:  Labs reviewed: Na 132, K 2.8, Cr 0.94, WBC 5.0, Hgb 9.3, Plts 229  Family Communication: Pt in room, family not at bedside  Disposition: Status is: Inpatient Remains inpatient appropriate  because: Severity of illness  Planned Discharge Destination: Skilled nursing facility     Author: Rickey Barbara, MD 05/07/2023 4:41 PM  For on call review [email or web address].

## 2023-05-08 DIAGNOSIS — S72001A Fracture of unspecified part of neck of right femur, initial encounter for closed fracture: Secondary | ICD-10-CM | POA: Diagnosis not present

## 2023-05-08 LAB — GLUCOSE, CAPILLARY
Glucose-Capillary: 110 mg/dL — ABNORMAL HIGH (ref 70–99)
Glucose-Capillary: 134 mg/dL — ABNORMAL HIGH (ref 70–99)

## 2023-05-08 LAB — COMPREHENSIVE METABOLIC PANEL
ALT: 9 U/L (ref 0–44)
AST: 16 U/L (ref 15–41)
Albumin: 3 g/dL — ABNORMAL LOW (ref 3.5–5.0)
Alkaline Phosphatase: 74 U/L (ref 38–126)
Anion gap: 12 (ref 5–15)
BUN: 8 mg/dL (ref 8–23)
CO2: 22 mmol/L (ref 22–32)
Calcium: 8.8 mg/dL — ABNORMAL LOW (ref 8.9–10.3)
Chloride: 100 mmol/L (ref 98–111)
Creatinine, Ser: 0.9 mg/dL (ref 0.61–1.24)
GFR, Estimated: >60 mL/min (ref >60)
Glucose, Bld: 112 mg/dL — ABNORMAL HIGH (ref 70–99)
Potassium: 4.2 mmol/L (ref 3.5–5.1)
Sodium: 134 mmol/L — ABNORMAL LOW (ref 135–145)
Total Bilirubin: 0.9 mg/dL (ref 0.3–1.2)
Total Protein: 5.7 g/dL — ABNORMAL LOW (ref 6.5–8.1)

## 2023-05-08 NOTE — Progress Notes (Signed)
Progress Note   Patient: Danny Brady UJW:[id number] DOB: [birth year] DOA: 04/22/2023     16 DOS: the patient was seen and examined on 05/08/2023   Brief hospital course: 89 y.o. male with medical history significant for hypertension, hyperlipidemia, HFrEF, CVA, paroxysmal A-fib not anticoagulated who presented after a mechanical fall.  Uses a walker at baseline.  He fell after a turn with his walker.  He was in his usual state of health prior to this. In the ED he was found to have periprosthetic distal right femur fracture.  He was hospitalized for further management.  Underwent ORIF on 6/29.   Assessment and Plan: COVID-19 infection Patient did have a cough last week but never had fever.  Chest x-ray did not show any opacities.  One of his caregivers who was spending time with him in the hospital tested positive for COVID-19.  Patient was tested and he also came back as positive.  No need for any therapeutics since patient has minimal symptoms.   Continue with cough medications. Remains on minimal O2 support Continue flutter valve for secretions  Post viral PNA -Recently noted to have increased cough and sputum production -Recent CXR reviewed with increase in interstitial markings on R lower lung fields -Will treat with 5 days of rocephin and azithro -reports cough has improved since starting abx   Periprosthetic distal right femur fracture, post mechanical fall. S/p ORIF.  Seems to be stable for the most part.  Seen by PT and OT.  Initially inpatient rehabilitation was being considered.  However insurance has denied this.  SNF remains pending, likely in the next 48hrs Orthopedics recommends aspirin 81 mg twice daily at discharge.     Chronic normocytic anemia/acute blood loss anemia Drop in hemoglobin was due to postoperative blood loss.   Pt is s/p 3 units PRBC's this visit -Hgb remains stable   Hyponatremia Urine osmolality 501.  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   Patient was also on furosemide prior to admission.  He seems to be fairly euvolemic.   Patient's furosemide was resumed.  He was started on salt tablets.  Na remains stable   Hypokalemia Replaced Recheck bmet in AM   Severe constipation Constipation resolved after he was given enema. Continue bowel regimen.     Hyperglycemia HbA1c 5.5.   Paroxysmal A-fib  Rate is controlled. Not on anticoagulation Monitor on telemetry   AKI due to urinary retention Renal function has improved after Foley catheter was placed.  Continue with finasteride.  Voiding trial once he is more ambulatory.  Will need to be discharged with Foley catheter.   Chronic HFrEF Fairly euvolemic.  Continue to monitor volume status.   Chronic anxiety/depression Continue Zoloft. Experiencing some sundowning occasionally.  Reorient daily.  No focal neurological deficits.  UA unremarkable.   GERD Continue PPI      Subjective: States feeling better. Eager to be discharged soon  Physical Exam: Vitals:   05/08/23 0420 05/08/23 0737 05/08/23 0805 05/08/23 1444  BP: 117/71  131/64 128/80  Pulse: (!) 54  61 (!) 103  Resp: 18     Temp: 97.9 F (36.6 C)  97.7 F (36.5 C) 98 F (36.7 C)  TempSrc: Oral   Oral  SpO2: 97%  98% 97%  Weight:  101.2 kg    Height:       General exam: Conversant, in no acute distress Respiratory system: normal chest rise, clear, no audible wheezing Cardiovascular system: regular rhythm, s1-s2 Gastrointestinal system: Nondistended,  nontender, pos BS Central nervous system: No seizures, no tremors Extremities: No cyanosis, no joint deformities Skin: No rashes, no pallor Psychiatry: Affect normal // no auditory hallucinations   Data Reviewed:  Labs reviewed: Na 134, K 4.2, Cr 0.90  Family Communication: Pt in room, family not at bedside  Disposition: Status is: Inpatient Remains inpatient appropriate because: Severity of illness  Planned Discharge Destination: Skilled nursing  facility     Author: Rickey Barbara, MD 05/08/2023 7:00 PM  For on call review [email or web address].

## 2023-05-08 NOTE — Progress Notes (Signed)
Physical Therapy Treatment Patient Details Name: Danny Brady MRN: [id number] DOB: 12-15-1926 Today's Date: 05/08/2023   History of Present Illness Danny Brady is a 89 y.o. male who presented 6/28 after a mechanical fall resulting in a R periprosthetic distal femur fx; s/p ORIF 6/29, WBAT; PMH includes: hypertension, hyperlipidemia, HFrEF, CVA, paroxysmal A-fib not anticoagulated    PT Comments  Pt tolerated treatment well today. Pt was able to transfer to chair with +2 Min A and appeared to be much more steady today with less knee buckling noted. No change in DC/DME recs at this time. PT will continue to follow.    Assistance Recommended at Discharge Frequent or constant Supervision/Assistance  If plan is discharge home, recommend the following:  Can travel by private vehicle    A lot of help with walking and/or transfers;Two people to help with walking and/or transfers;Two people to help with bathing/dressing/bathroom;Assistance with cooking/housework;Assist for transportation;Help with stairs or ramp for entrance   No  Equipment Recommendations  Other (comment) (Per accepting facility)    Recommendations for Other Services       Precautions / Restrictions Precautions Precautions: Fall Restrictions Weight Bearing Restrictions: Yes RLE Weight Bearing: Weight bearing as tolerated     Mobility  Bed Mobility Overal bed mobility: Needs Assistance Bed Mobility: Supine to Sit     Supine to sit: Min assist     General bed mobility comments: Pt able to come EOB on his own with use of bed rails. Min A for trunk elevation at the end.    Transfers Overall transfer level: Needs assistance Equipment used: Rolling walker (2 wheels) Transfers: Sit to/from Stand, Bed to chair/wheelchair/BSC Sit to Stand: +2 physical assistance, Min assist   Step pivot transfers: +2 physical assistance, Min assist       General transfer comment: Pt much more steady today with transfer and  much less knee buckling noted.    Ambulation/Gait                   Stairs             Wheelchair Mobility     Tilt Bed    Modified Rankin (Stroke Patients Only)       Balance Overall balance assessment: Needs assistance Sitting-balance support: Feet supported Sitting balance-Leahy Scale: Fair Sitting balance - Comments: able to sit on EOB   Standing balance support: Reliant on assistive device for balance, Bilateral upper extremity supported Standing balance-Leahy Scale: Poor Standing balance comment: RW reliant                            Cognition Arousal/Alertness: Awake/alert Behavior During Therapy: WFL for tasks assessed/performed Overall Cognitive Status: Within Functional Limits for tasks assessed                                 General Comments: pleasant, likes to joke        Exercises      General Comments General comments (skin integrity, edema, etc.): VSS on RA      Pertinent Vitals/Pain Pain Assessment Pain Assessment: No/denies pain    Home Living                          Prior Function            PT Goals (current goals  can now be found in the care plan section) Progress towards PT goals: Progressing toward goals    Frequency    Min 3X/week      PT Plan Current plan remains appropriate    Co-evaluation              AM-PAC PT "6 Clicks" Mobility   Outcome Measure  Help needed turning from your back to your side while in a flat bed without using bedrails?: A Little Help needed moving from lying on your back to sitting on the side of a flat bed without using bedrails?: A Little Help needed moving to and from a bed to a chair (including a wheelchair)?: A Lot Help needed standing up from a chair using your arms (e.g., wheelchair or bedside chair)?: A Lot Help needed to walk in hospital room?: A Lot Help needed climbing 3-5 steps with a railing? : Total 6 Click Score:  13    End of Session Equipment Utilized During Treatment: Gait belt (Custom gait belt for skin integrity) Activity Tolerance: Patient tolerated treatment well Patient left: in chair;with call bell/phone within reach;with chair alarm set;with family/visitor present Nurse Communication: Mobility status PT Visit Diagnosis: Unsteadiness on feet (R26.81);Other abnormalities of gait and mobility (R26.89);History of falling (Z91.81);Pain;Muscle weakness (generalized) (M62.81)     Time: 4627-0350 PT Time Calculation (min) (ACUTE ONLY): 20 min  Charges:    $Therapeutic Activity: 8-22 mins PT General Charges $$ ACUTE PT VISIT: 1 Visit                     [name], PT, DPT Acute Rehab Services [phone]    [name] 05/08/2023, 12:44 PM

## 2023-05-08 NOTE — Plan of Care (Signed)

## 2023-05-09 DIAGNOSIS — S72001A Fracture of unspecified part of neck of right femur, initial encounter for closed fracture: Secondary | ICD-10-CM | POA: Diagnosis not present

## 2023-05-09 LAB — COMPREHENSIVE METABOLIC PANEL
ALT: 8 U/L (ref 0–44)
AST: 15 U/L (ref 15–41)
Albumin: 2.8 g/dL — ABNORMAL LOW (ref 3.5–5.0)
Alkaline Phosphatase: 80 U/L (ref 38–126)
Anion gap: 9 (ref 5–15)
BUN: 9 mg/dL (ref 8–23)
CO2: 22 mmol/L (ref 22–32)
Calcium: 8.4 mg/dL — ABNORMAL LOW (ref 8.9–10.3)
Chloride: 100 mmol/L (ref 98–111)
Creatinine, Ser: 0.88 mg/dL (ref 0.61–1.24)
GFR, Estimated: >60 mL/min (ref >60)
Glucose, Bld: 106 mg/dL — ABNORMAL HIGH (ref 70–99)
Potassium: 3.5 mmol/L (ref 3.5–5.1)
Sodium: 131 mmol/L — ABNORMAL LOW (ref 135–145)
Total Bilirubin: 0.9 mg/dL (ref 0.3–1.2)
Total Protein: 5.4 g/dL — ABNORMAL LOW (ref 6.5–8.1)

## 2023-05-09 LAB — CBC
HCT: 29.4 % — ABNORMAL LOW (ref 39.0–52.0)
Hemoglobin: 9.2 g/dL — ABNORMAL LOW (ref 13.0–17.0)
MCH: 30.4 pg (ref 26.0–34.0)
MCHC: 31.3 g/dL (ref 30.0–36.0)
MCV: 97 fL (ref 80.0–100.0)
Platelets: 222 10*3/uL (ref 150–400)
RBC: 3.03 MIL/uL — ABNORMAL LOW (ref 4.22–5.81)
RDW: 17.6 % — ABNORMAL HIGH (ref 11.5–15.5)
WBC: 4.7 10*3/uL (ref 4.0–10.5)
nRBC: 0 % (ref 0.0–0.2)

## 2023-05-09 LAB — GLUCOSE, CAPILLARY
Glucose-Capillary: 132 mg/dL — ABNORMAL HIGH (ref 70–99)
Glucose-Capillary: 108 mg/dL — ABNORMAL HIGH (ref 70–99)
Glucose-Capillary: 105 mg/dL — ABNORMAL HIGH (ref 70–99)
Glucose-Capillary: 134 mg/dL — ABNORMAL HIGH (ref 70–99)

## 2023-05-09 MED ORDER — POTASSIUM CHLORIDE CRYS ER 20 MEQ PO TBCR
40.0000 meq | EXTENDED_RELEASE_TABLET | Freq: Once | ORAL | Status: AC
  Administered 2023-05-09: 40 meq via ORAL
  Filled 2023-05-09: qty 2

## 2023-05-09 MED ORDER — BACITRACIN-NEOMYCIN-POLYMYXIN OINTMENT TUBE
TOPICAL_OINTMENT | Freq: Two times a day (BID) | CUTANEOUS | Status: DC
  Filled 2023-05-09: qty 14

## 2023-05-09 NOTE — Progress Notes (Signed)
Progress Note   Patient: Danny Brady UEA:[id number] DOB: 1926-12-21 DOA: 04/22/2023     17 DOS: the patient was seen and examined on 05/09/2023   Brief hospital course: 89 y.o. male with medical history significant for hypertension, hyperlipidemia, HFrEF, CVA, paroxysmal A-fib not anticoagulated who presented after a mechanical fall.  Uses a walker at baseline.  He fell after a turn with his walker.  He was in his usual state of health prior to this. In the ED he was found to have periprosthetic distal right femur fracture.  He was hospitalized for further management.  Underwent ORIF on 6/29.   Assessment and Plan: COVID-19 infection One of his caregivers who was spending time with him in the hospital tested positive for COVID-19.  Patient was tested and he also came back as positive.  Did not receive therapeutics since patient had minimal symptoms.   Continue with cough medications. Remains on minimal O2 support Continue flutter valve for secretions Now off isolation as of 7/16  Post viral PNA -Recently noted to have increased cough and sputum production -Recent CXR reviewed with increase in interstitial markings on R lower lung fields -Pt to complete 5 days of rocephin and azithro on 7/18 -reports cough has improved since starting abx   Periprosthetic distal right femur fracture, post mechanical fall. S/p ORIF.  Seems to be stable for the most part.  Seen by PT and OT.  Initially inpatient rehabilitation was being considered.  However insurance has denied this.   -Plan for SNF as early as 7/17 Orthopedics recommends aspirin 81 mg twice daily at discharge.     Chronic normocytic anemia/acute blood loss anemia Drop in hemoglobin was due to postoperative blood loss.   Pt is s/p 3 units PRBC's this visit -Hgb has since remained stable   Hyponatremia Urine osmolality 501.  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  Patient was also on furosemide prior to admission.  He seems to be  fairly euvolemic.   Patient's furosemide was resumed.  He was started on salt tablets.  Na remains stable   Hypokalemia Replaced Recheck bmet in AM   Severe constipation Constipation resolved after he was given enema. Continue bowel regimen.     Hyperglycemia HbA1c 5.5.   Paroxysmal A-fib  Rate is controlled. Not on anticoagulation Monitor on telemetry   AKI due to urinary retention Renal function has improved after Foley catheter was placed.  Continue with finasteride.  Voiding trial once he is more ambulatory.  Will need to be discharged with Foley catheter.   Chronic HFrEF Fairly euvolemic.  Continue to monitor volume status.   Chronic anxiety/depression Continue Zoloft. Experiencing some sundowning occasionally.  Reorient daily.  No focal neurological deficits.  UA unremarkable.   GERD Continue PPI      Subjective: Reports feeling better overall. Eager to go to rehab  Physical Exam: Vitals:   05/08/23 1444 05/08/23 2000 05/09/23 0500 05/09/23 0829  BP: 128/80 128/64 119/61 (!) 147/79  Pulse: (!) 103 69 61 74  Resp:  16 18 18   Temp: 98 F (36.7 C) 97.9 F (36.6 C) 98 F (36.7 C) 98 F (36.7 C)  TempSrc: Oral Oral Oral Oral  SpO2: 97% 98% 95% 99%  Weight:   100.9 kg   Height:       General exam: Awake, laying in bed, in nad Respiratory system: Normal respiratory effort, no wheezing Cardiovascular system: regular rate, s1, s2 Gastrointestinal system: Soft, nondistended, positive BS Central nervous system: CN2-12  grossly intact, strength intact Extremities: Perfused, no clubbing Skin: Normal skin turgor, no notable skin lesions seen Psychiatry: Mood normal // no visual hallucinations   Data Reviewed:  Labs reviewed: Na 131, K 3.5, Cr 0.88, Hgb 9.2, WBC 4.7  Family Communication: Pt in room, family not at bedside  Disposition: Status is: Inpatient Remains inpatient appropriate because: Severity of illness  Planned Discharge Destination: Skilled  nursing facility     Author: Rickey Barbara, MD 05/09/2023 5:03 PM  For on call review [email or web address].

## 2023-05-09 NOTE — TOC Progression Note (Signed)
Transition of Care [facility]) - Progression Note    Patient Details  Name: WOFFORD STRATTON MRN: [id number] Date of Birth: 25-Nov-1926  [facility]) CM/SW Contact  Lorri Frederick, LCSW Phone Number: 05/09/2023, 12:18 PM  Clinical Narrative:   CSW spoke with T[facility], confirmed they can receive pt tomorrow.  Will need to be afternoon admission as the room will not be available before then.  Auth request submitted in [place] and approved: [id number], 3 days: 7/17-7/19.  LM with son Arlys John to update.      Expected Discharge Plan: Skilled Nursing Facility Barriers to Discharge: SNF Covid  Expected Discharge Plan and Services In-house Referral: Clinical Social Work   Post Acute Care Choice: Skilled Nursing Facility Living arrangements for the past 2 months: Single Family Home                                       Social Determinants of Health (SDOH) Interventions SDOH Screenings   Food Insecurity: No Food Insecurity (04/22/2023)  Housing: Low Risk  (04/22/2023)  Transportation Needs: No Transportation Needs (04/22/2023)  Utilities: Not At Risk (04/22/2023)  Tobacco Use: Low Risk  (04/22/2023)    Readmission Risk Interventions     No data to display

## 2023-05-09 NOTE — Plan of Care (Signed)
  Problem: Education: Goal: Knowledge of General Education information will improve Description: Including pain rating scale, medication(s)/side effects and non-pharmacologic comfort measures Outcome: Not Progressing   Problem: Health Behavior/Discharge Planning: Goal: Ability to manage health-related needs will improve Outcome: Not Progressing   Problem: Clinical Measurements: Goal: Ability to maintain clinical measurements within normal limits will improve Outcome: Not Progressing Goal: Will remain free from infection Outcome: Not Progressing Goal: Diagnostic test results will improve Outcome: Not Progressing Goal: Respiratory complications will improve Outcome: Not Progressing Goal: Cardiovascular complication will be avoided Outcome: Not Progressing   Problem: Activity: Goal: Risk for activity intolerance will decrease Outcome: Not Progressing   Problem: Nutrition: Goal: Adequate nutrition will be maintained Outcome: Not Progressing   Problem: Coping: Goal: Level of anxiety will decrease Outcome: Not Progressing   Problem: Pain Managment: Goal: General experience of comfort will improve Outcome: Not Progressing   Problem: Safety: Goal: Ability to remain free from injury will improve Outcome: Not Progressing   Problem: Coping: Goal: Ability to adjust to condition or change in health will improve Outcome: Not Progressing   Problem: Fluid Volume: Goal: Ability to maintain a balanced intake and output will improve Outcome: Not Progressing

## 2023-05-09 NOTE — Plan of Care (Signed)

## 2023-05-09 NOTE — Progress Notes (Signed)
Occupational Therapy Treatment Patient Details Name: Danny Brady MRN: [id number] DOB: Aug 22, 1927 Today's Date: 05/09/2023   History of present illness Danny Brady is a 89 y.o. male who presented 6/28 after a mechanical fall resulting in a R periprosthetic distal femur fx; s/p ORIF 6/29, WBAT; PMH includes: hypertension, hyperlipidemia, HFrEF, CVA, paroxysmal A-fib not anticoagulated   OT comments  Pt progressing well, improving with BLE strength shown by improve bed mobility, less assistance for sit to stand, able to transfer to recliner, and able to stand up to 1 minute with no LOB, using RW. Pt assisted to EOB min A. Pt able to sit on EOB, max A for donning B socks/shoes. Pt attempted to transfer to recliner, had an immediate urge to use the bed pan, assisted back to supine max A for BLE assistance and turning/scooting back. Pt motivated to improve and would benefit from continued skilled therapy to maximize functional strength, DC plan appropriate.   Recommendations for follow up therapy are one component of a multi-disciplinary discharge planning process, led by the attending physician.  Recommendations may be updated based on patient status, additional functional criteria and insurance authorization.    Assistance Recommended at Discharge Frequent or constant Supervision/Assistance  Patient can return home with the following  Two people to help with walking and/or transfers;A lot of help with bathing/dressing/bathroom;Assistance with cooking/housework;Assist for transportation;Help with stairs or ramp for entrance;Direct supervision/assist for financial management;Direct supervision/assist for medications management   Equipment Recommendations  None recommended by OT    Recommendations for Other Services      Precautions / Restrictions Precautions Precautions: Fall Precaution Comments: Very fragile skin Required Braces or Orthoses: Other Brace Other Brace: Has custom shoes for  foot deformities and leg length discrepancy Restrictions Weight Bearing Restrictions: Yes RLE Weight Bearing: Weight bearing as tolerated       Mobility Bed Mobility Overal bed mobility: Needs Assistance Bed Mobility: Supine to Sit, Sit to Supine     Supine to sit: Min assist Sit to supine: Max assist   General bed mobility comments: min A to power up from supine. supine to sit max A for BLE assistance and turning    Transfers Overall transfer level: Needs assistance Equipment used: Rolling walker (2 wheels) Transfers: Sit to/from Stand Sit to Stand: Min assist, +2 physical assistance           General transfer comment: Pt performed STS min A x2, able to stand up to 1 min     Balance Overall balance assessment: Needs assistance Sitting-balance support: Feet supported Sitting balance-Leahy Scale: Fair Sitting balance - Comments: Pt and caregiver state he sat on EOB for 1 hour unassisted   Standing balance support: Reliant on assistive device for balance, Bilateral upper extremity supported Standing balance-Leahy Scale: Poor Standing balance comment: RW reliant                           ADL either performed or assessed with clinical judgement   ADL Overall ADL's : Needs assistance/impaired Eating/Feeding: Independent;Sitting                       Toilet Transfer: Moderate assistance;Stand-pivot;BSC/3in1;Rolling walker (2 wheels)   Toileting- Clothing Manipulation and Hygiene: Total assistance;Sit to/from stand       Functional mobility during ADLs: Moderate assistance;Rolling walker (2 wheels);+2 for physical assistance;+2 for safety/equipment General ADL Comments: Pt assisted to EOB to perform transfer, stated he immediately  needed the bed pan, insisted on using bed pan instead of BSC, nursing help assist back to bed    Extremity/Trunk Assessment Upper Extremity Assessment Upper Extremity Assessment: RUE deficits/detail;LUE  deficits/detail RUE Deficits / Details: B RTC tears, pain with limited shoulder ROM, minimal ABD/flexion limiting reaching/overhead activities, able to self feed, needs help with grooming. RUE Coordination: decreased gross motor LUE Deficits / Details: Son reports weakness and decr cooordination L side since CVA, B RTC tears, pain with limited shoulder ROM, minimal ABD/flexion limiting reaching/overhead activities, able to self feed, needs help with grooming. LUE Coordination: decreased gross motor            Vision       Perception     Praxis      Cognition Arousal/Alertness: Awake/alert Behavior During Therapy: WFL for tasks assessed/performed Overall Cognitive Status: Within Functional Limits for tasks assessed                                          Exercises      Shoulder Instructions       General Comments      Pertinent Vitals/ Pain       Pain Assessment Pain Assessment: No/denies pain Pain Score: 0-No pain  Home Living                                          Prior Functioning/Environment              Frequency  Min 2X/week        Progress Toward Goals  OT Goals(current goals can now be found in the care plan section)  Progress towards OT goals: Progressing toward goals  Acute Rehab OT Goals Patient Stated Goal: to improve overall strength and balance OT Goal Formulation: With patient Time For Goal Achievement: 05/08/23 Potential to Achieve Goals: Good ADL Goals Pt Will Perform Grooming: sitting;with set-up;with supervision Pt Will Transfer to Toilet: stand pivot transfer;with min assist;bedside commode Additional ADL Goal #1: Pt will independently use of breathing strategies to maintain SP02 above 93% during function tasks to demonstrate improved activity tolerance upon Physical exertion  Plan Discharge plan remains appropriate;Frequency remains appropriate    Co-evaluation                  AM-PAC OT "6 Clicks" Daily Activity     Outcome Measure   Help from another person eating meals?: None Help from another person taking care of personal grooming?: A Little Help from another person toileting, which includes using toliet, bedpan, or urinal?: A Lot Help from another person bathing (including washing, rinsing, drying)?: A Lot Help from another person to put on and taking off regular upper body clothing?: A Little Help from another person to put on and taking off regular lower body clothing?: A Lot 6 Click Score: 16    End of Session Equipment Utilized During Treatment: Gait belt;Rolling walker (2 wheels)  OT Visit Diagnosis: Unsteadiness on feet (R26.81);Other abnormalities of gait and mobility (R26.89);History of falling (Z91.81)   Activity Tolerance Patient tolerated treatment well   Patient Left in bed;with call bell/phone within reach;with family/visitor present;with nursing/sitter in room   Nurse Communication Mobility status        Time: 1610-9604 OT Time Calculation (min): 12 min  Charges: OT General Charges $OT Visit: 1 Visit OT Treatments $Therapeutic Activity: 8-22 mins  [address], OTR/L   Alexis Goodell 05/09/2023, 5:55 PM

## 2023-05-09 NOTE — Plan of Care (Signed)

## 2023-05-10 DIAGNOSIS — S7291XA Unspecified fracture of right femur, initial encounter for closed fracture: Secondary | ICD-10-CM | POA: Diagnosis not present

## 2023-05-10 DIAGNOSIS — I480 Paroxysmal atrial fibrillation: Secondary | ICD-10-CM | POA: Diagnosis not present

## 2023-05-10 DIAGNOSIS — I5020 Unspecified systolic (congestive) heart failure: Secondary | ICD-10-CM | POA: Diagnosis not present

## 2023-05-10 LAB — CBC
HCT: 29.4 % — ABNORMAL LOW (ref 39.0–52.0)
Hemoglobin: 9.5 g/dL — ABNORMAL LOW (ref 13.0–17.0)
MCH: 31.9 pg (ref 26.0–34.0)
MCHC: 32.3 g/dL (ref 30.0–36.0)
MCV: 98.7 fL (ref 80.0–100.0)
Platelets: 213 10*3/uL (ref 150–400)
RBC: 2.98 MIL/uL — ABNORMAL LOW (ref 4.22–5.81)
RDW: 17.5 % — ABNORMAL HIGH (ref 11.5–15.5)
WBC: 4.9 10*3/uL (ref 4.0–10.5)
nRBC: 0 % (ref 0.0–0.2)

## 2023-05-10 LAB — COMPREHENSIVE METABOLIC PANEL
ALT: 8 U/L (ref 0–44)
AST: 14 U/L — ABNORMAL LOW (ref 15–41)
Albumin: 2.7 g/dL — ABNORMAL LOW (ref 3.5–5.0)
Alkaline Phosphatase: 80 U/L (ref 38–126)
Anion gap: 8 (ref 5–15)
BUN: 7 mg/dL — ABNORMAL LOW (ref 8–23)
CO2: 23 mmol/L (ref 22–32)
Calcium: 8.2 mg/dL — ABNORMAL LOW (ref 8.9–10.3)
Chloride: 103 mmol/L (ref 98–111)
Creatinine, Ser: 0.88 mg/dL (ref 0.61–1.24)
GFR, Estimated: >60 mL/min (ref >60)
Glucose, Bld: 101 mg/dL — ABNORMAL HIGH (ref 70–99)
Potassium: 3.6 mmol/L (ref 3.5–5.1)
Sodium: 134 mmol/L — ABNORMAL LOW (ref 135–145)
Total Bilirubin: 0.8 mg/dL (ref 0.3–1.2)
Total Protein: 5.4 g/dL — ABNORMAL LOW (ref 6.5–8.1)

## 2023-05-10 LAB — GLUCOSE, CAPILLARY
Glucose-Capillary: 112 mg/dL — ABNORMAL HIGH (ref 70–99)
Glucose-Capillary: 109 mg/dL — ABNORMAL HIGH (ref 70–99)
Glucose-Capillary: 104 mg/dL — ABNORMAL HIGH (ref 70–99)

## 2023-05-10 MED ORDER — GUAIFENESIN 100 MG/5ML PO LIQD: 5.0000 mL | ORAL | Status: AC | PRN

## 2023-05-10 MED ORDER — SENNOSIDES-DOCUSATE SODIUM 8.6-50 MG PO TABS: 1.0000 | ORAL_TABLET | Freq: Two times a day (BID) | ORAL | Status: AC | PRN

## 2023-05-10 MED ORDER — SERTRALINE HCL 50 MG PO TABS: 50.0000 mg | ORAL_TABLET | Freq: Every day | ORAL | Status: AC

## 2023-05-10 NOTE — Progress Notes (Signed)
Pt discharging to [facility]. All belongings and discharge papers were given to family.

## 2023-05-10 NOTE — Discharge Summary (Signed)
Physician Discharge Summary  Danny Brady YNW:[id number] DOB: Oct 07, 1927 DOA: 04/22/2023  PCP: Danella Penton, MD  Admit date: 04/22/2023 Discharge date: 05/10/2023 Admitted From: Home Disposition: SNF Recommendations for Outpatient Follow-up:  Outpatient follow-up as below Check CBC and CMP in 1 week Voiding trial when patient is more mobile Please follow up on the following pending results: None  Discharge Condition: Stable CODE STATUS: Full code  Contact information for follow-up providers     Joen Laura, MD Follow up in 4 week(s).   Specialty: Orthopedic Surgery Contact information: [address] [place] [address] [phone]              Contact information for after-discharge care     Destination     [facility] Preferred SNF .   Service: Skilled Nursing Contact information: [address] [place] [address] [phone]                     Hospital course 89 y.o. male with medical history significant for hypertension, hyperlipidemia, HFrEF, CVA, paroxysmal A-fib not anticoagulated who presented after a mechanical fall.  Uses a walker at baseline.  He fell after a turn with his walker.  He was in his usual state of health prior to this. In the ED he was found to have periprosthetic distal right femur fracture.  He was hospitalized for further management.  Underwent ORIF on 6/29.   Hospital course complicated by postoperative blood loss anemia, COVID infection, postviral pneumonia and acute urinary retention.  Received 3 units of blood for blood loss anemia.  Did not require treatment for COVID infection but completed 5 days of ceftriaxone and Zithromax for postviral pneumonia.  He is discharged with Foley catheter for urine retention.  Recommend voiding trial when patient is more mobile.  Outpatient follow-up with orthopedic surgery as  above.  See individual problem list below for more.   Problems addressed during this hospitalization Principal Problem:   Femur fracture, right (HCC) Active Problems:   Paroxysmal atrial fibrillation (HCC)   HFrEF (heart failure with reduced ejection fraction) (HCC)  Periprosthetic distal right femur fracture, post mechanical fall. S/p ORIF on 6/29. -Norco for pain control and aspirin 81 mg twice daily for VTE prophylaxis per surgery -Continue PT/OT at rehab. -Outpatient follow-up with orthopedic surgery as above  COVID-19 infection: one of his caregivers who was spending time with him in the hospital tested positive for COVID-19.  Patient was tested and he also came back as positive.  Did not receive therapeutics since patient had minimal symptoms.  Completed isolation precaution on 7/16.   Post viral PNA: Had some increased cough and sputum production.  CXR with increased interstitial marking in RLL.  Complete 5 days of ceftriaxone and Zithromax.   Chronic normocytic anemia/acute blood loss anemia: Stable after 3 units. -Check CBC in 1 week   Hyponatremia Urine osmolality 501.  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  Patient was also on furosemide prior to admission.  He seems to be fairly euvolemic.   Patient's furosemide was resumed.  He was started on salt tablets.  Na remains stable   Hypokalemia: Resolved   Severe constipation: Resolved -Continue bowel regimen   Hyperglycemia without diabetes: A1c 5.5%   Paroxysmal A-fib: Not on meds.   AKI due to urinary retention: Renal function has improved after Foley catheter was placed.  Continue with finasteride.  Voiding trial once he is more ambulatory.   Chronic HFrEF: Euvolemic.   -Continue home Lasix.   Chronic anxiety/depression: Stable for most part. -Continue home Zoloft   GERD -Continue PPI  Time spent 35 minutes  Vital signs Vitals:   05/09/23 1941 05/10/23 0435 05/10/23 0500 05/10/23 0827  BP: 126/67  113/61  136/87  Pulse: 76 (!) 59  74  Temp: 97.9 F (36.6 C) 98.2 F (36.8 C)  97.8 F (36.6 C)  Resp: 17 17    Height:      Weight:   101.3 kg   SpO2: 98% 95%  97%  TempSrc:    Oral  BMI (Calculated):   30.28      Discharge exam  GENERAL: No apparent distress.  Nontoxic. HEENT: MMM.  Vision and hearing grossly intact.  NECK: Supple.  No apparent JVD.  RESP:  No IWOB.  Fair aeration bilaterally. CVS:  RRR.  2/6 SEM over RUSB and LUSB. ABD/GI/GU: BS+. Abd soft, NTND.  MSK/EXT:  Moves extremities. No apparent deformity. No edema.  SKIN: no apparent skin lesion or wound NEURO: Awake and alert. Oriented appropriately.  No apparent focal neuro deficit. PSYCH: Calm. Normal affect.   Discharge Instructions  Allergies as of 05/10/2023       Reactions   Prednisone Other (See Comments)   insomnia   Tamsulosin Other (See Comments)   Unknown insomnia        Medication List     STOP taking these medications    ciprofloxacin 500 MG tablet Commonly known as: CIPRO   HYDROcodone-acetaminophen 5-325 MG tablet Commonly known as: Norco   ibuprofen 400 MG tablet Commonly known as: ADVIL   Magnesium 250 MG Tabs   metolazone 2.5 MG tablet Commonly known as: ZAROXOLYN   potassium chloride 8 MEQ tablet Commonly known as: KLOR-CON   torsemide 20 MG tablet Commonly known as: DEMADEX       TAKE these medications    acetaminophen 500 MG tablet Commonly known as: TYLENOL Take 1 tablet (500 mg total) by mouth every 8 (eight) hours as needed.   aspirin EC 81 MG tablet Take 1 tablet (81 mg total) by mouth 2 (two) times daily for 28 days. Swallow whole. What changed:  when to take this additional instructions   finasteride 5 MG tablet Commonly known as: PROSCAR Take 5 mg by mouth every evening.   furosemide 20 MG tablet Commonly known as: LASIX Take 1 tablet (20 mg total) by mouth daily. What changed:  how much to take additional instructions   guaiFENesin  100 MG/5ML liquid Commonly known as: ROBITUSSIN Take 5 mLs by mouth every 4 (four) hours as needed for cough or to loosen phlegm.   omeprazole 20 MG capsule Commonly known as: PRILOSEC Take 20 mg by mouth daily.   senna-docusate 8.6-50 MG tablet Commonly known as: Senokot-S Take 1 tablet by mouth 2 (two) times daily between meals as needed for mild constipation.   sertraline 50 MG tablet Commonly known as: ZOLOFT Take 1 tablet (50 mg total) by mouth daily.       ASK your doctor about these medications    HYDROcodone-acetaminophen 5-325 MG tablet Commonly known as: NORCO/VICODIN Take 1 tablet by mouth every 4 (four) hours as needed for up to 7 days for moderate pain. Ask about: Should I take this medication?        Consultations: Orthopedic surgery  Procedures/Studies: ORIF of periprosthetic distal right femoral fracture on 6/29   DG FEMUR  PORT, MIN 2 VIEWS RIGHT  Result Date: 05/07/2023 CLINICAL DATA:  [id number] Fracture follow-up [id number] EXAM: RIGHT FEMUR PORTABLE 2 VIEW COMPARISON:  04/22/2023 FINDINGS: Redemonstration of lateral sideplate and screw fixation construct traversing distal femoral diaphyseal fracture. Hardware intact without evidence of loosening. No change in alignment of the femoral fracture. No bridging callus formation across the fracture site at this time. Intramedullary rod with proximal lag screw remain in place. Right total knee arthroplasty hardware intact. No hip or knee joint effusion. Diffuse soft tissue swelling of the thigh. IMPRESSION: Unchanged alignment of the distal femoral diaphyseal fracture status post ORIF. No evidence of hardware complication. Electronically Signed   By: Duanne Guess D.O.   On: 05/07/2023 09:15   DG CHEST PORT 1 VIEW  Result Date: 05/04/2023 CLINICAL DATA:  Difficulty breathing, COVID positive EXAM: PORTABLE CHEST 1 VIEW COMPARISON:  Previous studies including the examination of 04/28/2023 FINDINGS: Transverse diameter  of heart is increased. There is increase in interstitial markings in both lower lung fields, more so on the right side. There is no focal consolidation. There are no signs of alveolar pulmonary edema. Right lateral costophrenic angle is indistinct. This may suggest a small effusion. There is no pneumothorax. IMPRESSION: Cardiomegaly. Increased interstitial markings are seen in lower lung fields, more so on the right side with possible interval increase in the right lower lung field. Findings suggest possible interstitial pneumonia. Right lateral costophrenic angle is indistinct suggesting possible small effusion. Electronically Signed   By: Ernie Avena M.D.   On: 05/04/2023 17:33   DG CHEST PORT 1 VIEW  Result Date: 04/28/2023 CLINICAL DATA:  10031 Cough 10031 EXAM: PORTABLE CHEST - 1 VIEW COMPARISON:  11/20/2019 FINDINGS: Coarse bibasilar interstitial opacities as before. No new confluent airspace disease. Heart size upper limits normal. No effusion. Bilateral shoulder DJD. Curvilinear metallic foreign body projects over the right scapula as before. IMPRESSION: Coarse bibasilar interstitial opacities as before. No acute findings. Electronically Signed   By: Corlis Leak M.D.   On: 04/28/2023 13:01   DG FEMUR PORT, MIN 2 VIEWS RIGHT  Result Date: 04/22/2023 CLINICAL DATA:  Postoperative. EXAM: RIGHT FEMUR PORTABLE 2 VIEW COMPARISON:  CT femur 04/21/2023, right knee radiograph 04/21/2023, bilateral hip radiograph 04/21/2023 FINDINGS: Interval plate and screw fixation of the right femur. Intramedullary rod and proximal dynamic screw as well as partially visualized knee prosthesis. Hardware is intact without evidence of complication. Oblique right femoral diaphyseal fracture is again noted, not significantly changed compared to 04/21/2023. Mild subcutaneous emphysema, consistent postoperative state. IMPRESSION: Expected postoperative changes status post ORIF of the right femur. Electronically Signed   By:  Sherron Ales M.D.   On: 04/22/2023 17:22   DG FEMUR, MIN 2 VIEWS RIGHT  Result Date: 04/22/2023 CLINICAL DATA:  ORIF periprosthetic fracture right femur. EXAM: RIGHT FEMUR 2 VIEWS COMPARISON:  Right knee radiograph 04/21/2023 FINDINGS: Intraoperative ORIF of right femur periprosthetic fracture. Twelve low resolution intraoperative spot views of the right femur were obtained. Lateral plate and screw fixation noted. Total fluoroscopy time: 114 seconds Total radiation dose: 9.88 mGy IMPRESSION: Intraoperative ORIF of right femur periprosthetic fracture. Please see performing provider's procedural note for further details. Electronically Signed   By: Sherron Ales M.D.   On: 04/22/2023 17:18   DG C-Arm 1-60 Min-No Report  Result Date: 04/22/2023 Fluoroscopy was utilized by the requesting physician.  No radiographic interpretation.   DG C-Arm 1-60 Min-No Report  Result Date: 04/22/2023 Fluoroscopy was utilized by the requesting physician.  No  radiographic interpretation.   CT FEMUR RIGHT WO CONTRAST  Result Date: 04/21/2023 CLINICAL DATA:  Distal femoral fracture EXAM: CT OF THE LOWER RIGHT EXTREMITY WITHOUT CONTRAST TECHNIQUE: Multidetector CT imaging of the right lower extremity was performed according to the standard protocol. RADIATION DOSE REDUCTION: This exam was performed according to the departmental dose-optimization program which includes automated exposure control, adjustment of the mA and/or kV according to patient size and/or use of iterative reconstruction technique. COMPARISON:  None Available. FINDINGS: Bones/Joint/Cartilage Changes consistent with prior operative fixation of a proximal right femoral fracture is noted. No new proximal femoral fracture is seen. There is however a comminuted fracture involving the distal femoral shaft extending into the metaphysis with mild displacement of the fracture fragments. The fracture lines anteriorly extend to just above the left knee prosthesis.  Proximal tibia and fibula appear limits. Ligaments Suboptimally assessed by CT. Muscles and Tendons Calf musculature shows some fatty replacement consistent with atrophy. Musculature of the thigh shows some mild edema related to the recent fracture. No sizable hematoma is seen. Soft tissues Mild edema is noted surrounding the fracture site. No sizable hematoma is noted. Small joint effusion is seen. IMPRESSION: Oblique fracture through distal femoral diaphysis extending into the metaphysis just above the right knee prosthesis. Electronically Signed   By: Alcide Clever M.D.   On: 04/21/2023 19:54   DG Hips Bilat W or Wo Pelvis 3-4 Views  Result Date: 04/21/2023 CLINICAL DATA:  Larey Seat, right leg pain, deformity EXAM: DG HIP (WITH OR WITHOUT PELVIS) 3-4V BILAT; RIGHT KNEE - COMPLETE 4+ VIEW COMPARISON:  11/20/2019 FINDINGS: Bilateral hips: Frontal view of the pelvis as well as frontal and lateral views of the bilateral hips are obtained. There is partial visualization of an acute oblique right femoral diaphyseal fracture incompletely evaluated on this exam. Intramedullary rod and proximal dynamic screw are seen within the right femur, with evidence of a prior healed intertrochanteric right hip fracture. There are no acute hip fractures. Symmetrical bilateral hip osteoarthritis. The remainder of the bony pelvis is unremarkable. Right knee: Frontal, bilateral oblique, lateral views of the right knee are obtained. Distal aspect of an intramedullary rod is seen within the right femur, with an oblique displaced fracture of the distal right femoral diaphysis. There is external rotation of the fracture site. Three component right knee arthroplasty is identified without evidence of complication. The fracture of the distal femoral diaphysis does not appear to involve the right knee prosthesis. Small joint effusion. Diffuse soft tissue swelling of the distal thigh and knee. IMPRESSION: 1. Acute displaced oblique fracture of the  distal right femoral diaphysis. The fracture line involves the distal margin of the intramedullary rod from prior right hip ORIF. The fracture does not appear to involve the right knee prosthesis. 2. Small right knee effusion. 3. Diffuse soft tissue swelling of the distal right thigh and right knee. 4. Symmetrical bilateral hip osteoarthritis. Electronically Signed   By: Sharlet Salina M.D.   On: 04/21/2023 17:50   DG Knee Complete 4 Views Right  Result Date: 04/21/2023 CLINICAL DATA:  Larey Seat, right leg pain, deformity EXAM: DG HIP (WITH OR WITHOUT PELVIS) 3-4V BILAT; RIGHT KNEE - COMPLETE 4+ VIEW COMPARISON:  11/20/2019 FINDINGS: Bilateral hips: Frontal view of the pelvis as well as frontal and lateral views of the bilateral hips are obtained. There is partial visualization of an acute oblique right femoral diaphyseal fracture incompletely evaluated on this exam. Intramedullary rod and proximal dynamic screw are seen within the right femur,  with evidence of a prior healed intertrochanteric right hip fracture. There are no acute hip fractures. Symmetrical bilateral hip osteoarthritis. The remainder of the bony pelvis is unremarkable. Right knee: Frontal, bilateral oblique, lateral views of the right knee are obtained. Distal aspect of an intramedullary rod is seen within the right femur, with an oblique displaced fracture of the distal right femoral diaphysis. There is external rotation of the fracture site. Three component right knee arthroplasty is identified without evidence of complication. The fracture of the distal femoral diaphysis does not appear to involve the right knee prosthesis. Small joint effusion. Diffuse soft tissue swelling of the distal thigh and knee. IMPRESSION: 1. Acute displaced oblique fracture of the distal right femoral diaphysis. The fracture line involves the distal margin of the intramedullary rod from prior right hip ORIF. The fracture does not appear to involve the right knee  prosthesis. 2. Small right knee effusion. 3. Diffuse soft tissue swelling of the distal right thigh and right knee. 4. Symmetrical bilateral hip osteoarthritis. Electronically Signed   By: Sharlet Salina M.D.   On: 04/21/2023 17:50   DG Lumbar Spine 2-3 Views  Result Date: 04/21/2023 CLINICAL DATA:  fall, hip pain, knee pain EXAM: LUMBAR SPINE - 2-3 VIEW COMPARISON:  Abdomen pelvis 12/13/2019 FINDINGS: There is no evidence of lumbar spine fracture. Alignment is normal. Multilevel severe degenerative changes spine. Chronic compression fracture of the L1 and L3 vertebral body. Multilevel intervertebral disc space narrowing. Aortic calcification. IMPRESSION: 1. No acute displaced fracture or traumatic listhesis of the lumbar spine in a patient with chronic L1 and L3 vertebral fractures. Limited evaluation due to overlapping osseous structures and overlying soft tissues. 2.  Aortic Atherosclerosis (ICD10-I70.0). Electronically Signed   By: Tish Frederickson M.D.   On: 04/21/2023 17:50   CT Head Wo Contrast  Result Date: 04/21/2023 CLINICAL DATA:  Head trauma, minor (Age >= 65y); Neck trauma (Age >= 65y) EXAM: CT HEAD WITHOUT CONTRAST CT CERVICAL SPINE WITHOUT CONTRAST TECHNIQUE: Multidetector CT imaging of the head and cervical spine was performed following the standard protocol without intravenous contrast. Multiplanar CT image reconstructions of the cervical spine were also generated. RADIATION DOSE REDUCTION: This exam was performed according to the departmental dose-optimization program which includes automated exposure control, adjustment of the mA and/or kV according to patient size and/or use of iterative reconstruction technique. COMPARISON:  None Available. FINDINGS: CT HEAD FINDINGS Brain: Cerebral ventricle sizes are concordant with the degree of cerebral volume loss. Cerebral ventricle sizes are concordant with the degree of cerebral volume loss. No evidence of large-territorial acute infarction. No  parenchymal hemorrhage. No mass lesion. No extra-axial collection. No mass effect or midline shift. No hydrocephalus. Basilar cisterns are patent. Vascular: No hyperdense vessel. Skull: No acute fracture or focal lesion. Sinuses/Orbits: Paranasal sinuses and mastoid air cells are clear. Bilateral lens replacement. Otherwise the orbits are unremarkable. Other: None. CT CERVICAL SPINE FINDINGS Alignment: Grade 1 anterolisthesis C5 on C6, C6 on C7, C7 on T1. Skull base and vertebrae: Multilevel moderate degenerative changes of the spine. Associated severe osseous foraminal on the left C3-C4 level. No acute fracture. No aggressive appearing focal osseous lesion or focal pathologic process. Densely sclerotic lesion of the T2 level likely a bone island. Soft tissues and spinal canal: No prevertebral fluid or swelling. No visible canal hematoma. Upper chest: Unremarkable. Other: None. IMPRESSION: 1. No acute intracranial abnormality. 2. No acute displaced fracture or traumatic listhesis of the cervical spine. Electronically Signed   By: Blanchie Serve  Tessie Fass M.D.   On: 04/21/2023 17:14   CT Cervical Spine Wo Contrast  Result Date: 04/21/2023 CLINICAL DATA:  Head trauma, minor (Age >= 65y); Neck trauma (Age >= 65y) EXAM: CT HEAD WITHOUT CONTRAST CT CERVICAL SPINE WITHOUT CONTRAST TECHNIQUE: Multidetector CT imaging of the head and cervical spine was performed following the standard protocol without intravenous contrast. Multiplanar CT image reconstructions of the cervical spine were also generated. RADIATION DOSE REDUCTION: This exam was performed according to the departmental dose-optimization program which includes automated exposure control, adjustment of the mA and/or kV according to patient size and/or use of iterative reconstruction technique. COMPARISON:  None Available. FINDINGS: CT HEAD FINDINGS Brain: Cerebral ventricle sizes are concordant with the degree of cerebral volume loss. Cerebral ventricle sizes are  concordant with the degree of cerebral volume loss. No evidence of large-territorial acute infarction. No parenchymal hemorrhage. No mass lesion. No extra-axial collection. No mass effect or midline shift. No hydrocephalus. Basilar cisterns are patent. Vascular: No hyperdense vessel. Skull: No acute fracture or focal lesion. Sinuses/Orbits: Paranasal sinuses and mastoid air cells are clear. Bilateral lens replacement. Otherwise the orbits are unremarkable. Other: None. CT CERVICAL SPINE FINDINGS Alignment: Grade 1 anterolisthesis C5 on C6, C6 on C7, C7 on T1. Skull base and vertebrae: Multilevel moderate degenerative changes of the spine. Associated severe osseous foraminal on the left C3-C4 level. No acute fracture. No aggressive appearing focal osseous lesion or focal pathologic process. Densely sclerotic lesion of the T2 level likely a bone island. Soft tissues and spinal canal: No prevertebral fluid or swelling. No visible canal hematoma. Upper chest: Unremarkable. Other: None. IMPRESSION: 1. No acute intracranial abnormality. 2. No acute displaced fracture or traumatic listhesis of the cervical spine. Electronically Signed   By: Tish Frederickson M.D.   On: 04/21/2023 17:14       The results of significant diagnostics from this hospitalization (including imaging, microbiology, ancillary and laboratory) are listed below for reference.     Microbiology: No results found for this or any previous visit (from the past 240 hour(s)).   Labs:  CBC: Recent Labs  Lab 05/04/23 0345 05/07/23 0226 05/09/23 0211 05/10/23 0129  WBC 4.5 5.0 4.7 4.9  HGB 8.9* 9.3* 9.2* 9.5*  HCT 27.8* 29.2* 29.4* 29.4*  MCV 94.2 95.4 97.0 98.7  PLT 187 229 222 213   BMP &GFR Recent Labs  Lab 05/04/23 0345 05/07/23 0226 05/08/23 0903 05/09/23 0211 05/10/23 0129  NA 131* 132* 134* 131* 134*  K 3.3* 2.8* 4.2 3.5 3.6  CL 100 101 100 100 103  CO2 22 22 22 22 23   GLUCOSE 100* 98 112* 106* 101*  BUN 9 8 8 9  7*   CREATININE 0.87 0.94 0.90 0.88 0.88  CALCIUM 8.3* 8.3* 8.8* 8.4* 8.2*  MG  --  1.7  --   --   --    Estimated Creatinine Clearance: 63.2 mL/min (by C-G formula based on SCr of 0.88 mg/dL). Liver & Pancreas: Recent Labs  Lab 05/04/23 0345 05/07/23 0226 05/08/23 0903 05/09/23 0211 05/10/23 0129  AST 18 15 16 15  14*  ALT 9 9 9 8 8   ALKPHOS 70 75 74 80 80  BILITOT 2.0* 1.4* 0.9 0.9 0.8  PROT 5.4* 5.3* 5.7* 5.4* 5.4*  ALBUMIN 2.7* 2.8* 3.0* 2.8* 2.7*   No results for input(s): "LIPASE", "AMYLASE" in the last 168 hours. No results for input(s): "AMMONIA" in the last 168 hours. Diabetic: No results for input(s): "HGBA1C" in the last 72 hours. Recent  Labs  Lab 05/09/23 1157 05/09/23 1628 05/09/23 1940 05/10/23 0828 05/10/23 1129  GLUCAP 108* 105* 134* 112* 109*   Cardiac Enzymes: No results for input(s): "CKTOTAL", "CKMB", "CKMBINDEX", "TROPONINI" in the last 168 hours. No results for input(s): "PROBNP" in the last 8760 hours. Coagulation Profile: No results for input(s): "INR", "PROTIME" in the last 168 hours. Thyroid Function Tests: No results for input(s): "TSH", "T4TOTAL", "FREET4", "T3FREE", "THYROIDAB" in the last 72 hours. Lipid Profile: No results for input(s): "CHOL", "HDL", "LDLCALC", "TRIG", "CHOLHDL", "LDLDIRECT" in the last 72 hours. Anemia Panel: No results for input(s): "VITAMINB12", "FOLATE", "FERRITIN", "TIBC", "IRON", "RETICCTPCT" in the last 72 hours. Urine analysis:    Component Value Date/Time   COLORURINE YELLOW 04/28/2023 0733   APPEARANCEUR CLEAR 04/28/2023 0733   LABSPEC 1.025 04/28/2023 0733   PHURINE 6.0 04/28/2023 0733   GLUCOSEU NEGATIVE 04/28/2023 0733   HGBUR TRACE (A) 04/28/2023 0733   BILIRUBINUR SMALL (A) 04/28/2023 0733   KETONESUR NEGATIVE 04/28/2023 0733   PROTEINUR NEGATIVE 04/28/2023 0733   NITRITE NEGATIVE 04/28/2023 0733   LEUKOCYTESUR NEGATIVE 04/28/2023 0733   Sepsis Labs: Invalid input(s): "PROCALCITONIN",  "LACTICIDVEN"   SIGNED:  Almon Hercules, MD  [facility] 05/10/2023, 11:57 AM

## 2023-05-10 NOTE — TOC Transition Note (Signed)
Transition of Care [facility]) - CM/SW Discharge Note   Patient Details  Name: Danny Brady MRN: [id number] Date of Birth: 1927-06-24  Transition of Care [facility]) CM/SW Contact:  Lorri Frederick, LCSW Phone Number: 05/10/2023, 12:18 PM   Clinical Narrative:   Pt discharging to [facility].  RN call report to [phone].  CSW confirmed with [facility] they can receive pt today.  They need pt to arrive after 3pm due to discharge from this room.  PTAR notified.      Final next level of care: Skilled Nursing Facility Barriers to Discharge: Barriers Resolved   Patient Goals and CMS Choice CMS Medicare.gov Compare Post Acute Care list provided to:: Patient Choice offered to / list presented to : Patient  Discharge Placement                Patient chooses bed at: [facility] Patient to be transferred to facility by: ptar Name of family member notified: son brian in room Patient and family notified of of transfer: 05/10/23  Discharge Plan and Services Additional resources added to the After Visit Summary for   In-house Referral: Clinical Social Work   Post Acute Care Choice: Skilled Nursing Facility                               Social Determinants of Health (SDOH) Interventions SDOH Screenings   Food Insecurity: No Food Insecurity (04/22/2023)  Housing: Low Risk  (04/22/2023)  Transportation Needs: No Transportation Needs (04/22/2023)  Utilities: Not At Risk (04/22/2023)  Tobacco Use: Low Risk  (04/22/2023)     Readmission Risk Interventions     No data to display

## 2023-05-11 LAB — GLUCOSE, CAPILLARY
Glucose-Capillary: 98 mg/dL (ref 70–99)
Glucose-Capillary: 109 mg/dL — ABNORMAL HIGH (ref 70–99)

## 2024-03-17 ENCOUNTER — Ambulatory Visit (INDEPENDENT_AMBULATORY_CARE_PROVIDER_SITE_OTHER)

## 2024-03-17 ENCOUNTER — Ambulatory Visit
Admission: EM | Admit: 2024-03-17 | Discharge: 2024-03-17 | Disposition: A | Discharge status: Home / Self Care | Attending: Family Medicine | Admitting: Family Medicine

## 2024-03-17 DIAGNOSIS — W19XXXA Unspecified fall, initial encounter: Secondary | ICD-10-CM | POA: Diagnosis not present

## 2024-03-17 DIAGNOSIS — M25561 Pain in right knee: Secondary | ICD-10-CM

## 2024-03-17 DIAGNOSIS — T148XXA Other injury of unspecified body region, initial encounter: Secondary | ICD-10-CM | POA: Diagnosis not present

## 2024-03-17 DIAGNOSIS — M25562 Pain in left knee: Secondary | ICD-10-CM | POA: Diagnosis not present

## 2024-03-17 NOTE — ED Provider Notes (Signed)
 [facility]    CSN: [id number] Arrival date & time: 03/17/24  1154      History   Chief Complaint Chief Complaint  Patient presents with   Fall    HPI Danny Brady is a 89 y.o. male presents for evaluation after suffering a fall.  Patient is accompanied by his son and his caregiver.  He fell yesterday after mis-stepping on carpet.  He has skin tears to the left upper arm as well as the left lower extremity.  He was reportedly able to walk yesterday but is having significant pain with weightbearing today.  He localizes the pain primarily to the left knee.  He has had bilateral knee replacements.  He is also had a prior right hip fracture with subsequent surgical repair.   Past Medical History:  Diagnosis Date   Anemia    Anxiety    Edema    GERD (gastroesophageal reflux disease)    Hypertension     Patient Active Problem List   Diagnosis Date Noted   HFrEF (heart failure with reduced ejection fraction) (HCC) 04/24/2023   Closed displaced oblique fracture of shaft of right femur (HCC) 04/22/2023   Femur fracture, right (HCC) 04/22/2023   Hypomagnesemia 12/15/2019   Thrombocytopenia (HCC) 12/15/2019   Elevated liver enzymes    Transaminitis 12/13/2019   Lactic acidosis 12/13/2019   History of fracture of right hip 12/04/2019   Closed right hip fracture (HCC) 11/20/2019   Hypertension    Chronic systolic CHF (congestive heart failure) (HCC)    GERD (gastroesophageal reflux disease)    Fall    Closed comminuted fracture of right hip (HCC)    Hypokalemia    Paroxysmal atrial fibrillation (HCC)    B12 deficiency 12/21/2016   Urinary incontinence, male, stress 09/23/2016   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) 05/02/2016   Benign prostatic hyperplasia with urinary obstruction 03/23/2016   Hyperlipidemia, mixed 03/23/2016   Urinary retention 02/23/2016   History of nonmelanoma skin cancer 08/18/2015   Incomplete emptying of bladder 07/29/2014   Bladder  outlet obstruction 05/29/2013   Prostate cancer (HCC) 11/26/2012    Past Surgical History:  Procedure Laterality Date   BLADDER SURGERY     HERNIA REPAIR     INTRAMEDULLARY (IM) NAIL INTERTROCHANTERIC Right 11/20/2019   Procedure: INTRAMEDULLARY (IM) NAIL INTERTROCHANTRIC;  Surgeon: Jerlyn Moons, MD;  Location: [facility] ORS;  Service: Orthopedics;  Laterality: Right;   ORIF PERIPROSTHETIC FRACTURE Right 04/22/2023   Procedure: OPEN REDUCTION INTERNAL FIXATION (ORIF) PERIPROSTHETIC FRACTURE;  Surgeon: Murleen Arms, MD;  Location: [facility] OR;  Service: Orthopedics;  Laterality: Right;       Home Medications    Prior to Admission medications   Medication Sig Start Date End Date Taking? Authorizing Provider  ALPRAZolam (XANAX) 0.25 MG tablet Take by mouth.   Yes [provider]  aspirin  EC 81 MG tablet Take 81 mg by mouth daily. Swallow whole.   Yes [provider]  finasteride  (PROSCAR ) 5 MG tablet Take 5 mg by mouth every evening.  11/12/19  Yes [provider]  furosemide  (LASIX ) 20 MG tablet Take 1 tablet (20 mg total) by mouth daily. Patient taking differently: Take 10 mg by mouth daily. Pt states he takes half a tablet 12/16/19  Yes Patel, Sona, MD  omeprazole (PRILOSEC) 20 MG capsule Take 20 mg by mouth daily. 10/31/19  Yes [provider]  sertraline  (ZOLOFT ) 50 MG tablet Take 1 tablet (50 mg total) by mouth  daily. 05/10/23  Yes Gonfa, Taye T, MD  spironolactone  (ALDACTONE ) 25 MG tablet Take 25 mg by mouth daily.   Yes [provider]  torsemide (DEMADEX) 20 MG tablet Take 20 mg by mouth daily.   Yes [provider]  acetaminophen  (TYLENOL ) 500 MG tablet Take 1 tablet (500 mg total) by mouth every 8 (eight) hours as needed. 12/16/19   Patel, Sona, MD  guaiFENesin  (ROBITUSSIN) 100 MG/5ML liquid Take 5 mLs by mouth every 4 (four) hours as needed for cough or to loosen phlegm. 05/10/23   Gonfa, Taye T, MD  senna-docusate (SENOKOT-S)  8.6-50 MG tablet Take 1 tablet by mouth 2 (two) times daily between meals as needed for mild constipation. 05/10/23   Gonfa, Taye T, MD    Family History History reviewed. No pertinent family history.  Social History Social History   Tobacco Use   Smoking status: Never   Smokeless tobacco: Never  Vaping Use   Vaping status: Never Used  Substance Use Topics   Alcohol use: Not Currently   Drug use: Never     Allergies   Prednisone and Tamsulosin   Review of Systems Review of Systems Per HPI  Physical Exam Triage Vital Signs ED Triage Vitals  Encounter Vitals Group     BP 03/17/24 1230 106/72     Systolic BP Percentile --      Diastolic BP Percentile --      Pulse Rate 03/17/24 1230 68     Resp --      Temp 03/17/24 1230 98.1 F (36.7 C)     Temp Source 03/17/24 1223 Oral     SpO2 03/17/24 1230 94 %     Weight 03/17/24 1223 200 lb (90.7 kg)     Height 03/17/24 1223 6' (1.829 m)     Head Circumference --      Peak Flow --      Pain Score 03/17/24 1223 0     Pain Loc --      Pain Education --      Exclude from Growth Chart --    No data found.  Updated Vital Signs BP 106/72 (BP Location: Left Arm)   Pulse 68   Temp 98.1 F (36.7 C) (Oral)   Ht 6' (1.829 m)   Wt 90.7 kg   SpO2 94%   BMI 27.12 kg/m   Visual Acuity Right Eye Distance:   Left Eye Distance:   Bilateral Distance:    Right Eye Near:   Left Eye Near:    Bilateral Near:     Physical Exam Vitals and nursing note reviewed.  Constitutional:      General: He is not in acute distress. HENT:     Head: Normocephalic and atraumatic.  Cardiovascular:     Rate and Rhythm: Normal rate and regular rhythm.  Pulmonary:     Effort: Pulmonary effort is normal. No respiratory distress.  Musculoskeletal:     Comments: Swelling and bruising noted of the left knee.  Skin:    Comments: Multiple large skin tears noted to the left upper extremity.  Also has small skin tears to the left anterior tibia.   Neurological:     Mental Status: He is alert.      UC Treatments / Results  Labs (all labs ordered are listed, but only abnormal results are displayed) Labs Reviewed - No data to display  EKG   Radiology DG Knee 2 Views Left Result Date: 03/17/2024 CLINICAL DATA:  Fall yesterday. EXAM: LEFT KNEE - 1-2 VIEW COMPARISON:  None Available. FINDINGS: Total left knee arthroplasty intact. No evidence of acute fracture or dislocation. Possible small joint effusion. IMPRESSION: 1. No acute findings. 2. Total left knee arthroplasty. Electronically Signed   By: Roda Cirri M.D.   On: 03/17/2024 13:33   DG Knee 2 Views Right Result Date: 03/17/2024 CLINICAL DATA:  Recent fall, injury, pain EXAM: RIGHT KNEE - 1-2 VIEW COMPARISON:  05/07/2023 FINDINGS: Right total knee arthroplasty changes noted. Distal femur intramedullary rod and lateral plate screw fixation also present, partially imaged. Healed deformity of the right distal femur. No acute osseous finding, fracture, malalignment, or large effusion. Peripheral atherosclerosis noted. Bones are osteopenic. IMPRESSION: Postoperative changes as above. No acute finding by plain radiography. Electronically Signed   By: Melven Stable.  Shick M.D.   On: 03/17/2024 13:33   DG Hip Unilat With Pelvis 2-3 Views Left Result Date: 03/17/2024 CLINICAL DATA:  Fall yesterday. EXAM: DG HIP (WITH OR WITHOUT PELVIS) 2-3V LEFT COMPARISON:  04/21/2023 FINDINGS: Diffuse decreased bone mineralization. No evidence of acute fracture or dislocation involving the left hip. Visualized hardware over the right hip intact and unchanged. Remainder of the exam is unchanged. IMPRESSION: 1. No acute findings. 2. Diffuse decreased bone mineralization. Electronically Signed   By: Roda Cirri M.D.   On: 03/17/2024 13:32    Procedures Procedures (including critical care time)  Medications Ordered in UC Medications - No data to display  Initial Impression / Assessment and Plan / UC Course  I  have reviewed the triage vital signs and the nursing notes.  Pertinent labs & imaging results that were available during my care of the patient were reviewed by me and considered in my medical decision making (see chart for details).    89 year old male presents for evaluation after a fall.  Has multiple skin tears.  X-rays were obtained and were negative for acute findings.  Wounds were dressed.  Supportive care.  Advised to reach out to PCP for arranging home health nursing.  Final Clinical Impressions(s) / UC Diagnoses   Final diagnoses:  Fall, initial encounter  Multiple skin tears     Discharge Instructions      Rest, ice as needed.  Reach out to PCP for home health nursing/wound care.   ED Prescriptions   None    PDMP not reviewed this encounter.   Bettyjane Shenoy G, [place] 03/17/24 1355

## 2024-03-17 NOTE — ED Triage Notes (Signed)
 Pt is with his son and caregiver  Pt c/o fall on 03/16/24   Pt son states that he landed on the couch and his walker and hit his toes and knees.  Pt was able to walk yesterday but had weakness later in the night with

## 2024-03-17 NOTE — Discharge Instructions (Signed)
 Rest, ice as needed.  Reach out to PCP for home health nursing/wound care.
